# Patient Record
Sex: Female | Born: 1985 | Hispanic: Yes | Marital: Single | Smoking: Never smoker
Health system: Southern US, Community
[De-identification: ages and names within clinical notes are randomized; demographics above are authoritative.]

## PROBLEM LIST (undated history)

## (undated) ENCOUNTER — Inpatient Hospital Stay (HOSPITAL_COMMUNITY): Payer: Self-pay

## (undated) DIAGNOSIS — Z789 Other specified health status: Secondary | ICD-10-CM

## (undated) DIAGNOSIS — Z8659 Personal history of other mental and behavioral disorders: Secondary | ICD-10-CM

## (undated) DIAGNOSIS — B009 Herpesviral infection, unspecified: Secondary | ICD-10-CM

## (undated) DIAGNOSIS — K219 Gastro-esophageal reflux disease without esophagitis: Secondary | ICD-10-CM

## (undated) HISTORY — DX: Gastro-esophageal reflux disease without esophagitis: K21.9

## (undated) HISTORY — DX: Personal history of other mental and behavioral disorders: Z86.59

## (undated) HISTORY — PX: OTHER SURGICAL HISTORY: SHX169

---

## 2009-10-24 ENCOUNTER — Encounter: Payer: Self-pay | Admitting: Gynecology

## 2009-10-24 ENCOUNTER — Ambulatory Visit: Payer: Self-pay | Admitting: Gynecology

## 2009-10-24 ENCOUNTER — Other Ambulatory Visit: Admission: RE | Admit: 2009-10-24 | Discharge: 2009-10-24 | Payer: Self-pay | Admitting: Gynecology

## 2009-11-10 ENCOUNTER — Ambulatory Visit: Payer: Self-pay | Admitting: Gynecology

## 2009-11-21 ENCOUNTER — Ambulatory Visit: Payer: Self-pay | Admitting: Gynecology

## 2010-01-05 ENCOUNTER — Ambulatory Visit: Payer: Self-pay | Admitting: Gynecology

## 2010-04-02 ENCOUNTER — Other Ambulatory Visit: Admission: RE | Admit: 2010-04-02 | Discharge: 2010-04-02 | Payer: Self-pay | Admitting: Gynecology

## 2010-04-02 ENCOUNTER — Ambulatory Visit: Payer: Self-pay | Admitting: Gynecology

## 2010-09-20 ENCOUNTER — Ambulatory Visit: Payer: Self-pay | Admitting: Gynecology

## 2011-11-04 ENCOUNTER — Other Ambulatory Visit (HOSPITAL_COMMUNITY): Payer: Self-pay | Admitting: Physician Assistant

## 2011-11-04 DIAGNOSIS — Z3689 Encounter for other specified antenatal screening: Secondary | ICD-10-CM

## 2011-11-04 LAB — OB RESULTS CONSOLE ABO/RH: RH Type: POSITIVE

## 2011-11-04 LAB — OB RESULTS CONSOLE HEPATITIS B SURFACE ANTIGEN: Hepatitis B Surface Ag: NEGATIVE

## 2011-11-04 LAB — OB RESULTS CONSOLE HIV ANTIBODY (ROUTINE TESTING): HIV: NONREACTIVE

## 2011-11-04 LAB — OB RESULTS CONSOLE GC/CHLAMYDIA: Gonorrhea: NEGATIVE

## 2011-11-04 LAB — OB RESULTS CONSOLE RUBELLA ANTIBODY, IGM: Rubella: IMMUNE

## 2011-11-08 ENCOUNTER — Ambulatory Visit (HOSPITAL_COMMUNITY)
Admission: RE | Admit: 2011-11-08 | Discharge: 2011-11-08 | Disposition: A | Discharge status: Home / Self Care | Payer: Medicaid Other | Source: Ambulatory Visit | Attending: Physician Assistant | Admitting: Physician Assistant

## 2011-11-08 ENCOUNTER — Other Ambulatory Visit (HOSPITAL_COMMUNITY): Payer: Self-pay | Admitting: Physician Assistant

## 2011-11-08 DIAGNOSIS — Z3689 Encounter for other specified antenatal screening: Secondary | ICD-10-CM

## 2011-12-30 ENCOUNTER — Other Ambulatory Visit (HOSPITAL_COMMUNITY): Payer: Self-pay | Admitting: Nurse Practitioner

## 2011-12-30 DIAGNOSIS — Z3689 Encounter for other specified antenatal screening: Secondary | ICD-10-CM

## 2011-12-31 NOTE — L&D Delivery Note (Signed)
Delivery Note At  a viable unspecified sex was delivered via  (Presentation:loa ;  ).  APGAR:9 ,9 ; weight .   Placenta status:spont , .  Cord:3vc with the following complications:none .    Anesthesia: epidural  Episiotomy: none Lacerations: none Suture Repair:none Est. Blood Loss350 (mL):   Mom to AICU.  Baby to nursery-stable.  Colden Samaras [name] 04/19/2012, 3:30 PM

## 2012-01-02 ENCOUNTER — Ambulatory Visit (HOSPITAL_COMMUNITY)
Admission: RE | Admit: 2012-01-02 | Discharge: 2012-01-02 | Disposition: A | Discharge status: Home / Self Care | Payer: Medicaid Other | Source: Ambulatory Visit | Attending: Nurse Practitioner | Admitting: Nurse Practitioner

## 2012-01-02 DIAGNOSIS — Z363 Encounter for antenatal screening for malformations: Secondary | ICD-10-CM | POA: Insufficient documentation

## 2012-01-02 DIAGNOSIS — O358XX0 Maternal care for other (suspected) fetal abnormality and damage, not applicable or unspecified: Secondary | ICD-10-CM | POA: Insufficient documentation

## 2012-01-02 DIAGNOSIS — Z3689 Encounter for other specified antenatal screening: Secondary | ICD-10-CM

## 2012-01-02 DIAGNOSIS — Z1389 Encounter for screening for other disorder: Secondary | ICD-10-CM | POA: Insufficient documentation

## 2012-02-25 ENCOUNTER — Encounter (HOSPITAL_COMMUNITY): Payer: Self-pay | Admitting: *Deleted

## 2012-02-25 ENCOUNTER — Inpatient Hospital Stay (HOSPITAL_COMMUNITY)
Admission: AD | Admit: 2012-02-25 | Discharge: 2012-02-25 | Disposition: A | Discharge status: Home / Self Care | Payer: Medicaid Other | Source: Ambulatory Visit | Attending: Family Medicine | Admitting: Family Medicine

## 2012-02-25 DIAGNOSIS — O99891 Other specified diseases and conditions complicating pregnancy: Secondary | ICD-10-CM | POA: Insufficient documentation

## 2012-02-25 DIAGNOSIS — J111 Influenza due to unidentified influenza virus with other respiratory manifestations: Secondary | ICD-10-CM | POA: Insufficient documentation

## 2012-02-25 HISTORY — DX: Other specified health status: Z78.9

## 2012-02-25 MED ORDER — OSELTAMIVIR PHOSPHATE 75 MG PO CAPS
75.0000 mg | ORAL_CAPSULE | Freq: Once | ORAL | Status: AC
  Administered 2012-02-25: 75 mg via ORAL
  Filled 2012-02-25: qty 1

## 2012-02-25 MED ORDER — OSELTAMIVIR PHOSPHATE 75 MG PO CAPS: 75.0000 mg | ORAL_CAPSULE | Freq: Two times a day (BID) | ORAL | Status: AC

## 2012-02-25 NOTE — Progress Notes (Signed)
Notified of pt presenting for sore throat and heartburn. Notified of fever.  Will be down to see pt.

## 2012-02-25 NOTE — Discharge Instructions (Signed)
Informacin acerca de la gripe (Influenza Facts) La gripe es una enfermedad respiratoria contagiosa causada por el virus de la influenza. Puede causar Neomia Dear enfermedad moderada a severa. Mientras que la mayora de las personas sanas se recupera de la gripe sin un tratamiento especfico y sin complicaciones, los ancianos, los nios pequeos y los que sufren ciertas enfermedades tienen ms riesgo de sufrir complicaciones graves, y an Musician. CAUSAS  El virus de la gripe se [place] de persona a persona por gotitas que se eliminan al toser o al estornudar.   Una persona tambin puede infectarse al tocar un objeto o superficie contaminada con virus y [facility] mano a la boca, los ojos o la Clinical cytogeneticist.   Los adultos pueden infectar a Sports administrator anterior a que se produzcan los sntomas y [place] 7 das despus de enfermarse. Por lo Blaine Hamper, es posible que una persona contagie an antes de saber que est enfermo y que siga contagiando a otras personas mientras est enfermo.  SNTOMAS  Fiebre (normalmente alta).   Dolor de [place].   Cansancio (puede ser extremo).   Tos.   Dolor de Advertising copywriter.   Congestin nasal o que gotea.   Dolores [employer] cuerpo.   Puede presentar diarrea o vmitos, especialmente los nios.   Estos sntomas se conocen como "sntomas caractersticos de la gripe". Muchas enfermedades distintas, incluso el resfro comn, pueden tener sntomas similares.  DIAGNSTICO  Existen pruebas disponibles que pueden determinar si usted tiene gripe, siempre que se realicen dentro de los primeros 2  3 das despus del comienzo de los sntomas.   Un examen mdico y anlisis adicionales pueden ser necesarios para identificar si tiene una enfermedad que est complicando la gripe.  RIESGOS Y COMPLICACIONES Algunos de las complicaciones que ocasiona la gripe son:  Josefina Do bacteriana o neumona progresiva causada por el virus de la gripe.   Prdida de lquidos corporales  (deshidratacin).   Empeoramiento de enfermedades crnicas, tales como la insuficiencia cardaca, el asma, o la diabetes.   Problemas en la cavidad nasal e infecciones del odo.  INSTRUCCIONES PARA EL CUIDADO DOMICILIARIO  Solicite atencin mdica tempranamente.   Si est en riesgo de sufrir complicaciones de la gripe, Hospital doctor a un profesional de la salud cuando [employer] sntomas. Las [employer] tienen un alto riesgo de complicaciones son:   Mayores de 50 aos de edad.   Pacientes con enfermedades crnicas.   Mujeres embarazadas.   Nios pequeos.   El profesional que lo asiste podr recomendar la utilizacin de ciertas drogas antivirales para el tratamiento de la gripe.   Si contrae gripe, es aconsejable que descanse lo suficiente, beba gran cantidad de lquidos y evite el consumo de alcohol y tabaco.   Tambin puede tomar medicamentos de venta libre que alivien los sntomas de la gripe, si se lo permite el profesional que lo asiste. (Nunca de aspirina a nios o adolescentes que tienen sntomas de la gripe, particularmente fiebre).  PREVENCIN La mejor forma y la ms simple de prevenir la gripe es vacunndose cada otoo. Otras medidas que pueden ayudarlo a protegerse contra la gripe son:  Medicamentos antivirales   Se han aprobado algunos medicamentos antivirales para la prevencin de esta enfermedad Son medicamentos prescriptos y Hospital doctor al mdico antes de utilizarlos.   Hbitos para [employer] boca y la Darene Lamer con un tis cuando tosa o estornude, luego bote el tis luego de usarlo.   Lvese las  manos frecuentemente con agua y jabn, especialmente luego de toser o Engineering geologist. Si no tiene IT trainer, use un limpiador de manos con alcohol.   Evite el contacto personas que estn enfermas.   Si contrae la gripe, no vaya al Aleen Campi o escuela. Si est enfermo, no se acerque a otras personas para no contagiarlas.   Evite tocarse los ojos,  la nariz o la boca. Los grmenes a menudo se contagian de Building services engineer.  SIGNOS DE EMERGENCIA QUE NECESITAN ATENCIN MDICA URGENTE EN NIOS:  Respiracin rpida o problemas para respirar.   Color de piel azulado.   No bebe suficiente cantidad de lquidos.   No se despierta o no interacta.   Est tan irritable que no quiere que se lo cargue.   Los sntomas mejoran pero luego vuelven con fiebre y la tos [place].   Fiebre con erupcin cutnea.  SIGNOS DE EMERGENCIA QUE NECESITAN ATENCIN MDICA URGENTE EN ADULTOS:  Le falta la respiracin o presenta dificultades respiratorias.   Dolor o presin en el pecho o abdomen.   Mareos repentinos.   Confusin.   Vmitos intensos y persistentes.  SOLICITE ATENCIN MDICA DE INMEDIATO SI OBSERVA: Usted o alguien que conoce tiene los sntomas descritos arriba. Cuando llegue al centro de emergencias, comunique en la recepcin que cree tener gripe. Es posible que le pidan que utilice una mscara y/o [place] en un rea aislada para evitar que otros se contagien. EST SEGURO QUE:  Comprende las instrucciones para el alta mdica.   Controlar su enfermedad.   Solicitar atencin mdica de inmediato segn las indicaciones.  Document Released: 03/24/2008 Document Revised: 08/28/2011 [facility] Patient Information 2012 [place], [place].

## 2012-02-25 NOTE — Progress Notes (Signed)
K. Shaw, CNM at bedside.  Assessment done and poc discussed with pt.  

## 2012-02-25 NOTE — Progress Notes (Signed)
Pt states for the last 2 days she has body aches, a bad sore throat and has vomited 4 times

## 2012-02-25 NOTE — Progress Notes (Signed)
Interpreter at bedside for discharge instructions . 

## 2012-02-25 NOTE — ED Provider Notes (Signed)
Kristen Frank is a 26 y.o. female presenting for eval of sore throat fever x 24 hrs. Also with body aches, congestion and dry cough. Denies preg complications such as bldg or leak or dysuria. Has not had any family members with illness. Also c/o heartburn x 2 wks- no N/V. History OB History    Grav Para Term Preterm Abortions TAB SAB Ect Mult Living   1              Past Medical History  Diagnosis Date  . No pertinent past medical history    History reviewed. No pertinent past surgical history. Family History: family history is not on file. Social History:  reports that she has never smoked. She does not have any smokeless tobacco history on file. She reports that she does not drink alcohol or use illicit drugs.  ROS    Blood pressure 119/84, pulse 96, temperature 101.3 F (38.5 C), temperature source Oral, resp. rate 20, height 5\' 1"  (1.549 m), weight 59.875 kg (132 lb), SpO2 99.00%. Maternal Exam:  Uterine Assessment: Rare, mild ctx  Cervix: Exam deferred  Fetal Exam Fetal Monitor Review: Baseline rate: 160.  Variability: moderate (6-25 bpm).   Pattern: accelerations present and no decelerations.       Physical Exam  Constitutional: She is oriented to person, place, and time. She appears well-developed and well-nourished.  HENT:  Head: Normocephalic.  Mouth/Throat: No oropharyngeal exudate.       Neg for swollen glands  Neck: Normal range of motion.  Cardiovascular: Normal rate.   Respiratory: Effort normal.  Musculoskeletal: Normal range of motion.  Neurological: She is alert and oriented to person, place, and time.  Skin: Skin is warm and dry.  Psychiatric: She has a normal mood and affect. Her behavior is normal. Thought content normal.    Prenatal labs: ABO, Rh:   Antibody:   Rubella:   RPR:    HBsAg:    HIV:    GBS:     Assessment/Plan: IUP at 30.6 Presumptive flu  Begin tx for Tamiflu since s/s began approx 24 hrs ago- first dose here Rx Tamiflu  75mg  BID x 5d Tylenol 650mg  q 4-6 hours for fever/aches Rev'd OTC meds for reflux Keep next scheduled appt   Kristen Frank 02/25/2012, 10:39 PM

## 2012-02-26 LAB — RAPID STREP SCREEN (MED CTR MEBANE ONLY): Streptococcus, Group A Screen (Direct): NEGATIVE

## 2012-02-26 NOTE — ED Provider Notes (Signed)
Chart reviewed and agree with management and plan.  

## 2012-04-18 ENCOUNTER — Inpatient Hospital Stay (HOSPITAL_COMMUNITY)
Admission: AD | Admit: 2012-04-18 | Discharge: 2012-04-21 | DRG: 774 | Disposition: A | Discharge status: Home / Self Care | Payer: Medicaid Other | Source: Ambulatory Visit | Attending: Obstetrics & Gynecology | Admitting: Obstetrics & Gynecology

## 2012-04-18 ENCOUNTER — Encounter (HOSPITAL_COMMUNITY): Payer: Self-pay

## 2012-04-18 DIAGNOSIS — O4290 Premature rupture of membranes, unspecified as to length of time between rupture and onset of labor, unspecified weeks of gestation: Principal | ICD-10-CM | POA: Diagnosis present

## 2012-04-18 DIAGNOSIS — IMO0002 Reserved for concepts with insufficient information to code with codable children: Secondary | ICD-10-CM | POA: Diagnosis present

## 2012-04-18 DIAGNOSIS — O1490 Unspecified pre-eclampsia, unspecified trimester: Secondary | ICD-10-CM

## 2012-04-18 HISTORY — DX: Herpesviral infection, unspecified: B00.9

## 2012-04-18 LAB — COMPREHENSIVE METABOLIC PANEL
ALT: 25 U/L (ref 0–35)
AST: 42 U/L — ABNORMAL HIGH (ref 0–37)
Albumin: 3 g/dL — ABNORMAL LOW (ref 3.5–5.2)
Alkaline Phosphatase: 223 U/L — ABNORMAL HIGH (ref 39–117)
CO2: 20 mEq/L (ref 19–32)
Chloride: 101 mEq/L (ref 96–112)
Potassium: 4.2 mEq/L (ref 3.5–5.1)
Sodium: 135 mEq/L (ref 135–145)
Total Bilirubin: 0.2 mg/dL — ABNORMAL LOW (ref 0.3–1.2)

## 2012-04-18 LAB — CBC
Hemoglobin: 14.4 g/dL (ref 12.0–15.0)
Platelets: 174 10*3/uL (ref 150–400)
RBC: 4.62 MIL/uL (ref 3.87–5.11)
WBC: 7.8 10*3/uL (ref 4.0–10.5)

## 2012-04-18 MED ORDER — NALBUPHINE SYRINGE 5 MG/0.5 ML
10.0000 mg | INJECTION | INTRAMUSCULAR | Status: DC | PRN
  Administered 2012-04-19: 10 mg via INTRAVENOUS
  Filled 2012-04-18 (×2): qty 0.5

## 2012-04-18 MED ORDER — IBUPROFEN 600 MG PO TABS: 600.0000 mg | ORAL_TABLET | Freq: Four times a day (QID) | ORAL | Status: DC | PRN

## 2012-04-18 MED ORDER — LACTATED RINGERS IV SOLN
INTRAVENOUS | Status: DC
  Administered 2012-04-18: 21:00:00 via INTRAVENOUS
  Administered 2012-04-19: 999 mL/h via INTRAVENOUS
  Administered 2012-04-19: 08:00:00 via INTRAVENOUS

## 2012-04-18 MED ORDER — MAGNESIUM SULFATE BOLUS VIA INFUSION
4.0000 g | Freq: Once | INTRAVENOUS | Status: DC
  Filled 2012-04-18: qty 500

## 2012-04-18 MED ORDER — OXYTOCIN 20 UNITS IN LACTATED RINGERS INFUSION - SIMPLE: 125.0000 mL/h | Freq: Once | INTRAVENOUS | Status: DC

## 2012-04-18 MED ORDER — ONDANSETRON HCL 4 MG/2ML IJ SOLN
4.0000 mg | Freq: Four times a day (QID) | INTRAMUSCULAR | Status: DC | PRN
  Administered 2012-04-19: 4 mg via INTRAVENOUS
  Filled 2012-04-18: qty 2

## 2012-04-18 MED ORDER — OXYCODONE-ACETAMINOPHEN 5-325 MG PO TABS: 1.0000 | ORAL_TABLET | ORAL | Status: DC | PRN

## 2012-04-18 MED ORDER — CITRIC ACID-SODIUM CITRATE 334-500 MG/5ML PO SOLN: 30.0000 mL | ORAL | Status: DC | PRN

## 2012-04-18 MED ORDER — VALACYCLOVIR HCL 500 MG PO TABS
500.0000 mg | ORAL_TABLET | Freq: Two times a day (BID) | ORAL | Status: DC
  Administered 2012-04-18 – 2012-04-19 (×2): 500 mg via ORAL
  Filled 2012-04-18 (×4): qty 1

## 2012-04-18 MED ORDER — OXYTOCIN BOLUS FROM INFUSION
500.0000 mL | Freq: Once | INTRAVENOUS | Status: DC
  Filled 2012-04-18: qty 1000
  Filled 2012-04-18: qty 500

## 2012-04-18 MED ORDER — MAGNESIUM SULFATE 40 G IN LACTATED RINGERS - SIMPLE
2.0000 g/h | INTRAVENOUS | Status: AC
  Administered 2012-04-18: 4 g/h via INTRAVENOUS
  Administered 2012-04-19: 2 g/h via INTRAVENOUS
  Filled 2012-04-18 (×2): qty 500

## 2012-04-18 MED ORDER — LACTATED RINGERS IV SOLN: 500.0000 mL | INTRAVENOUS | Status: DC | PRN

## 2012-04-18 MED ORDER — LIDOCAINE HCL (PF) 1 % IJ SOLN
30.0000 mL | INTRAMUSCULAR | Status: DC | PRN
  Filled 2012-04-18: qty 30

## 2012-04-18 MED ORDER — FLEET ENEMA 7-19 GM/118ML RE ENEM: 1.0000 | ENEMA | RECTAL | Status: DC | PRN

## 2012-04-18 MED ORDER — ACETAMINOPHEN 325 MG PO TABS: 650.0000 mg | ORAL_TABLET | ORAL | Status: DC | PRN

## 2012-04-18 MED ORDER — MISOPROSTOL 25 MCG QUARTER TABLET
25.0000 ug | ORAL_TABLET | Freq: Once | ORAL | Status: AC
  Administered 2012-04-18: 25 ug via VAGINAL
  Filled 2012-04-18: qty 0.25

## 2012-04-18 NOTE — Progress Notes (Signed)
   Subjective: Notified by RN of pt report of epigastric pain.  Pt denies vision changes or headache.  Remains comfortable with contractions.  Objective: BP 133/101  Pulse 66  Temp(Src) 98.2 F (36.8 C) (Oral)  Resp 20  Ht 5' 0.75" (1.543 m)  Wt 62.256 kg (137 lb 4 oz)  BMI 26.15 kg/m2      FHT:  FHR: 140's bpm, variability: moderate,  accelerations:  Present,  decelerations:  Absent UC:   irregular, every 5-10 minutes SVE:   Dilation: Closed Effacement (%): Thick Station: -3 Exam by:: orton, jonathan  Labs: Lab Results  Component Value Date   WBC 7.8 04/18/2012   HGB 14.4 04/18/2012   HCT 43.0 04/18/2012   MCV 93.1 04/18/2012   PLT 174 04/18/2012   CMP     Component Value Date/Time   NA 135 04/18/2012 2045   K 4.2 04/18/2012 2045   CL 101 04/18/2012 2045   CO2 20 04/18/2012 2045   GLUCOSE 85 04/18/2012 2045   BUN 17 04/18/2012 2045   CREATININE 0.74 04/18/2012 2045   CALCIUM 9.5 04/18/2012 2045   PROT 6.8 04/18/2012 2045   ALBUMIN 3.0* 04/18/2012 2045   AST 42* 04/18/2012 2045   ALT 25 04/18/2012 2045   ALKPHOS 223* 04/18/2012 2045   BILITOT 0.2* 04/18/2012 2045   GFRNONAA >90 04/18/2012 2045   GFRAA >90 04/18/2012 2045     Assessment / Plan: PROM Augmentation of Labor Preeclampsia  Labor: Continue cytotec Preeclampsia:  Begin Magnesium Sulfate Fetal Wellbeing:  Category I Pain Control:  Labor support without medications I/D:  n/a Anticipated MOD:  NSVD  [facility] 04/18/2012, 11:12 PM

## 2012-04-18 NOTE — H&P (Signed)
Kristen Frank is a 26 y.o. female presenting for SROM @ 10:30 am. Positive [place].  Maternal Medical History:  Reason for admission: Reason for admission: rupture of membranes.  Contractions: Frequency: rare.    Fetal activity: Perceived fetal activity is normal.   Last perceived fetal movement was within the past hour.    Prenatal complications: No bleeding, HIV, hypertension, preterm labor or substance abuse.   HSV   Prenatal Complications - Diabetes: none.    OB History    Grav Para Term Preterm Abortions TAB SAB Ect Mult Living   1              Past Medical History  Diagnosis Date  . No pertinent past medical history   . HSV-2 infection    History reviewed. No pertinent past surgical history. Family History: family history is not on file. Social History:  reports that she has never smoked. She does not have any smokeless tobacco history on file. She reports that she does not drink alcohol or use illicit drugs.  ROS Pertinent items are noted in HPI.  Dilation: Closed Effacement (%): Thick Exam by:: dr Rivka Safer Temperature 98.3 F (36.8 C), temperature source Oral, resp. rate 18, height 5' 0.75" (1.543 m), weight 62.256 kg (137 lb 4 oz). Exam Physical Exam  Lungs:  Normal respiratory effort, chest expands symmetrically. Lungs are clear to auscultation, no crackles or wheezes. Heart - Regular rate and rhythm.  No murmurs, gallops or rubs.    Abdomen: soft and non-tender without masses, organomegaly or hernias noted.  No guarding or rebound. Gravid.  Extremities:   Non-tender, No cyanosis, edema, or deformity noted. Skin:  Intact without suspicious lesions or rashes Cervix: normal appearing, closed. (speculum) Vagina: no evidence of active herpes. Normal appearing. No blood or discharge.  Eye - Pupils Equal Round Reactive to light Neuro: grossly intact.   Prenatal labs: pending  Assessment/Plan: 26 y/o G1P0 @ 38.3 with SROM and reassuring fetal monitoring  without contractions.   Admit for induction of labor cytotec 25 mcg vaginal Will recheck in 4 hours, if advancing will start low dose pitocin. No evidence of infection at this time.  Edd Arbour MD 04/18/2012, 8:47 PM

## 2012-04-18 NOTE — H&P (Signed)
Pt assessed and agree with plan of care of resident.

## 2012-04-18 NOTE — MAU Note (Signed)
Patient is here with c/o leaking amniotic fluids since 10am, she states that the fluids is clear. She denies any pain. Reports good fetal movement.

## 2012-04-18 NOTE — Progress Notes (Signed)
Unable to place cytotec due to more than 3 uc's in 10 min.  Called provider to inform them, state they will be down to place.

## 2012-04-19 ENCOUNTER — Inpatient Hospital Stay (HOSPITAL_COMMUNITY): Payer: Medicaid Other | Admitting: Anesthesiology

## 2012-04-19 ENCOUNTER — Encounter (HOSPITAL_COMMUNITY): Payer: Self-pay | Admitting: Anesthesiology

## 2012-04-19 ENCOUNTER — Encounter (HOSPITAL_COMMUNITY): Payer: Self-pay | Admitting: Obstetrics and Gynecology

## 2012-04-19 DIAGNOSIS — IMO0002 Reserved for concepts with insufficient information to code with codable children: Secondary | ICD-10-CM

## 2012-04-19 DIAGNOSIS — O4290 Premature rupture of membranes, unspecified as to length of time between rupture and onset of labor, unspecified weeks of gestation: Secondary | ICD-10-CM

## 2012-04-19 LAB — CBC
HCT: 43.6 % (ref 36.0–46.0)
Hemoglobin: 14.8 g/dL (ref 12.0–15.0)
MCHC: 33.9 g/dL (ref 30.0–36.0)
MCV: 92 fL (ref 78.0–100.0)
RDW: 14.4 % (ref 11.5–15.5)
WBC: 16.1 10*3/uL — ABNORMAL HIGH (ref 4.0–10.5)

## 2012-04-19 LAB — RPR: RPR Ser Ql: NONREACTIVE

## 2012-04-19 LAB — MRSA PCR SCREENING: MRSA by PCR: NEGATIVE

## 2012-04-19 LAB — ABO/RH: ABO/RH(D): O POS

## 2012-04-19 MED ORDER — WITCH HAZEL-GLYCERIN EX PADS: 1.0000 "application " | MEDICATED_PAD | CUTANEOUS | Status: DC | PRN

## 2012-04-19 MED ORDER — NALBUPHINE SYRINGE 5 MG/0.5 ML
10.0000 mg | INJECTION | Freq: Once | INTRAMUSCULAR | Status: AC
  Administered 2012-04-19: 10 mg via INTRAMUSCULAR
  Filled 2012-04-19: qty 1

## 2012-04-19 MED ORDER — DIPHENHYDRAMINE HCL 50 MG/ML IJ SOLN: 12.5000 mg | INTRAMUSCULAR | Status: DC | PRN

## 2012-04-19 MED ORDER — LANOLIN HYDROUS EX OINT: TOPICAL_OINTMENT | CUTANEOUS | Status: DC | PRN

## 2012-04-19 MED ORDER — BENZOCAINE-MENTHOL 20-0.5 % EX AERO
1.0000 "application " | INHALATION_SPRAY | CUTANEOUS | Status: DC | PRN
  Filled 2012-04-19: qty 56

## 2012-04-19 MED ORDER — EPHEDRINE 5 MG/ML INJ: 10.0000 mg | INTRAVENOUS | Status: DC | PRN

## 2012-04-19 MED ORDER — DIPHENHYDRAMINE HCL 25 MG PO CAPS: 25.0000 mg | ORAL_CAPSULE | Freq: Four times a day (QID) | ORAL | Status: DC | PRN

## 2012-04-19 MED ORDER — PHENYLEPHRINE 40 MCG/ML (10ML) SYRINGE FOR IV PUSH (FOR BLOOD PRESSURE SUPPORT)
80.0000 ug | PREFILLED_SYRINGE | INTRAVENOUS | Status: DC | PRN
  Filled 2012-04-19: qty 5

## 2012-04-19 MED ORDER — DIBUCAINE 1 % RE OINT
1.0000 "application " | TOPICAL_OINTMENT | RECTAL | Status: DC | PRN
  Filled 2012-04-19: qty 28

## 2012-04-19 MED ORDER — LACTATED RINGERS IV SOLN: 500.0000 mL | Freq: Once | INTRAVENOUS | Status: DC

## 2012-04-19 MED ORDER — PHENYLEPHRINE 40 MCG/ML (10ML) SYRINGE FOR IV PUSH (FOR BLOOD PRESSURE SUPPORT): 80.0000 ug | PREFILLED_SYRINGE | INTRAVENOUS | Status: DC | PRN

## 2012-04-19 MED ORDER — OXYTOCIN 20 UNITS IN LACTATED RINGERS INFUSION - SIMPLE
1.0000 m[IU]/min | INTRAVENOUS | Status: DC
  Administered 2012-04-19: 2 m[IU]/min via INTRAVENOUS

## 2012-04-19 MED ORDER — SIMETHICONE 80 MG PO CHEW: 80.0000 mg | CHEWABLE_TABLET | ORAL | Status: DC | PRN

## 2012-04-19 MED ORDER — EPHEDRINE 5 MG/ML INJ
10.0000 mg | INTRAVENOUS | Status: DC | PRN
  Filled 2012-04-19: qty 4

## 2012-04-19 MED ORDER — OXYCODONE-ACETAMINOPHEN 5-325 MG PO TABS: 1.0000 | ORAL_TABLET | ORAL | Status: DC | PRN

## 2012-04-19 MED ORDER — FENTANYL 2.5 MCG/ML BUPIVACAINE 1/10 % EPIDURAL INFUSION (WH - ANES)
14.0000 mL/h | INTRAMUSCULAR | Status: DC
  Filled 2012-04-19: qty 60

## 2012-04-19 MED ORDER — LIDOCAINE HCL (PF) 1 % IJ SOLN
INTRAMUSCULAR | Status: DC | PRN
  Administered 2012-04-19: 3 mL
  Administered 2012-04-19: 4 mL

## 2012-04-19 MED ORDER — ONDANSETRON HCL 4 MG/2ML IJ SOLN: 4.0000 mg | INTRAMUSCULAR | Status: DC | PRN

## 2012-04-19 MED ORDER — LACTATED RINGERS IV SOLN
INTRAVENOUS | Status: DC
  Administered 2012-04-19 – 2012-04-20 (×2): via INTRAVENOUS

## 2012-04-19 MED ORDER — PRENATAL MULTIVITAMIN CH
1.0000 | ORAL_TABLET | Freq: Every day | ORAL | Status: DC
  Administered 2012-04-20 – 2012-04-21 (×2): 1 via ORAL
  Filled 2012-04-19 (×2): qty 1

## 2012-04-19 MED ORDER — TERBUTALINE SULFATE 1 MG/ML IJ SOLN: 0.2500 mg | Freq: Once | INTRAMUSCULAR | Status: DC | PRN

## 2012-04-19 MED ORDER — TETANUS-DIPHTH-ACELL PERTUSSIS 5-2.5-18.5 LF-MCG/0.5 IM SUSP
0.5000 mL | Freq: Once | INTRAMUSCULAR | Status: AC
  Administered 2012-04-20: 0.5 mL via INTRAMUSCULAR
  Filled 2012-04-19 (×2): qty 0.5

## 2012-04-19 MED ORDER — IBUPROFEN 600 MG PO TABS
600.0000 mg | ORAL_TABLET | Freq: Four times a day (QID) | ORAL | Status: DC
  Administered 2012-04-19 – 2012-04-21 (×7): 600 mg via ORAL
  Filled 2012-04-19 (×7): qty 1

## 2012-04-19 MED ORDER — HYDROXYZINE HCL 50 MG PO TABS
50.0000 mg | ORAL_TABLET | Freq: Once | ORAL | Status: AC
  Administered 2012-04-19: 50 mg via ORAL
  Filled 2012-04-19: qty 1

## 2012-04-19 MED ORDER — NALBUPHINE SYRINGE 5 MG/0.5 ML
10.0000 mg | INJECTION | Freq: Once | INTRAMUSCULAR | Status: AC
  Administered 2012-04-19: 10 mg via INTRAVENOUS
  Filled 2012-04-19: qty 1

## 2012-04-19 MED ORDER — MISOPROSTOL 25 MCG QUARTER TABLET
25.0000 ug | ORAL_TABLET | Freq: Once | ORAL | Status: AC
  Administered 2012-04-19: 25 ug via VAGINAL
  Filled 2012-04-19: qty 0.25

## 2012-04-19 MED ORDER — MISOPROSTOL 25 MCG QUARTER TABLET
25.0000 ug | ORAL_TABLET | Freq: Once | ORAL | Status: DC
  Filled 2012-04-19: qty 0.25

## 2012-04-19 MED ORDER — HYDROXYZINE HCL 50 MG/ML IM SOLN: 50.0000 mg | Freq: Once | INTRAMUSCULAR | Status: DC

## 2012-04-19 MED ORDER — ONDANSETRON HCL 4 MG PO TABS: 4.0000 mg | ORAL_TABLET | ORAL | Status: DC | PRN

## 2012-04-19 MED ORDER — SENNOSIDES-DOCUSATE SODIUM 8.6-50 MG PO TABS
2.0000 | ORAL_TABLET | Freq: Every day | ORAL | Status: DC
  Administered 2012-04-19 – 2012-04-20 (×2): 2 via ORAL

## 2012-04-19 MED ORDER — FENTANYL 2.5 MCG/ML BUPIVACAINE 1/10 % EPIDURAL INFUSION (WH - ANES)
INTRAMUSCULAR | Status: DC | PRN
  Administered 2012-04-19: 12 mL/h via EPIDURAL

## 2012-04-19 MED ORDER — ZOLPIDEM TARTRATE 5 MG PO TABS: 5.0000 mg | ORAL_TABLET | Freq: Every evening | ORAL | Status: DC | PRN

## 2012-04-19 NOTE — Progress Notes (Signed)
Lab at bedside for CBC draw

## 2012-04-19 NOTE — Progress Notes (Signed)
SVD of viable female per Marlynn Perking CNM.

## 2012-04-19 NOTE — Anesthesia Postprocedure Evaluation (Signed)
  Anesthesia Post-op Note  Patient: Kristen Frank  Procedure(s) Performed: * Lumbar Epidural for L&D *  Patient Location: ICU  Anesthesia Type: Epidural  Level of Consciousness: awake, alert  and oriented  Airway and Oxygen Therapy: Patient Spontanous Breathing  Post-op Pain: none  Post-op Assessment: Post-op Vital signs reviewed, Patient's Cardiovascular Status Stable, Respiratory Function Stable, Patent Airway, No signs of Nausea or vomiting, Pain level controlled, No headache, No backache, No residual numbness and No residual motor weakness. Patient has some numbness of face bilaterally, probably related to Magnesium Sulfate infusion.  Post-op Vital Signs: Reviewed and stable  Complications: No apparent anesthesia complications

## 2012-04-19 NOTE — Progress Notes (Signed)
Kristen Frank is a 26 y.o. G1P0 at [redacted]w[redacted]d by ultrasound admitted for induction of labor due to Spontaneous rupture of membranes Spoke to patient in spanish, she understood and had all questions answered.  Subjective: Minimal pain.   Objective: BP 110/82  Pulse 68  Temp(Src) 98.2 F (36.8 C) (Oral)  Resp 16  Ht 5' 0.75" (1.543 m)  Wt 62.256 kg (137 lb 4 oz)  BMI 26.15 kg/m2   Total I/O In: 928.3 [P.O.:240; I.V.:688.3] Out: 2000 [Urine:2000]  FHT:  FHR: 145 bpm, variability: moderate,  accelerations:  Abscent,  decelerations:  Absent UC:   irregular SVE:    1 cm 50% 0 station Soft BISHOP 7  Labs: Lab Results  Component Value Date   WBC 7.8 04/18/2012   HGB 14.4 04/18/2012   HCT 43.0 04/18/2012   MCV 93.1 04/18/2012   PLT 174 04/18/2012   Assessment / Plan: IOL 2nd SROM progressing well with cytotec.   Labor: Progressing normally Preeclampsia:  on magnesium sulfate Fetal Wellbeing:  Category I Pain Control:  nubain, may have epidural I/D:  n/a Anticipated MOD:  NSVD  Joie Reamer MD 04/19/2012, 5:19 AM

## 2012-04-19 NOTE — Progress Notes (Signed)
Kristen Frank is a 26 y.o. G1P0 at [redacted]w[redacted]d by ultrasound admitted for induction of labor due to Spontaneous rupture of BOW.  Subjective:   Objective: BP 115/82  Pulse 98  Temp(Src) 98 F (36.7 C) (Oral)  Resp 16  Ht 5' 0.75" (1.543 m)  Wt 137 lb 4 oz (62.256 kg)  BMI 26.15 kg/m2  SpO2 94% I/O last 3 completed shifts: In: 1798.3 [P.O.:360; I.V.:1438.3] Out: 2600 [Urine:2600] Total I/O In: 0  Out: 300 [Urine:300]  FHT:  FHR: 125 bpm, variability: minimal ,  accelerations:  Abscent,  decelerations:  Absent UC:   regular, every 4 minutes SVE:   Dilation: 7 Effacement (%): 80 Station: +1;+2 Exam by:: Kristen Coe RN  Labs: Lab Results  Component Value Date   WBC 16.1* 04/19/2012   HGB 14.8 04/19/2012   HCT 43.6 04/19/2012   MCV 92.0 04/19/2012   PLT 162 04/19/2012    Assessment / Plan: progressing well  Labor: Progressing normally Preeclampsia:  on magnesium sulfate Fetal Wellbeing:  Category II Pain Control:  Epidural I/D:  n/a Anticipated MOD:  NSVD  Kristen Frank Kristen Frank 04/19/2012, 12:09 PM

## 2012-04-19 NOTE — Progress Notes (Signed)
Interpreter at bedside to assist with discussing plan of care for today.  Pt anxious and continues to ask for pain relief.   Answered questions for pt and mother regarding pain control for today and medications for PIH.  Pt and mother verbalized understanding. Will contact FP for orders for pain medication.

## 2012-04-19 NOTE — Progress Notes (Signed)
Pt "pushing" even she states she feels no pressure.  SVE completed.  Encouraged pt to not push until she feels pressure.  Pt repositioned.

## 2012-04-19 NOTE — Progress Notes (Signed)
Kristen Frank is a 26 y.o. G1P0 at [redacted]w[redacted]d by ultrasound admitted for rupture of membranes  Subjective:   Objective: BP 142/101  Pulse 94  Temp(Src) 98 F (36.7 C) (Oral)  Resp 16  Ht 5' 0.75" (1.543 m)  Wt 137 lb 4 oz (62.256 kg)  BMI 26.15 kg/m2 I/O last 3 completed shifts: In: 1798.3 [P.O.:360; I.V.:1438.3] Out: 2600 [Urine:2600]    FHT:  FHR: 135 bpm, variability: moderate,  accelerations:  Present,  decelerations:  Absent UC:   irregular, every 58 minutes SVE:   Dilation: 1 Effacement (%): 50 Station: 0 Exam by:: Edd Arbour MD  Labs: Lab Results  Component Value Date   WBC 7.8 04/18/2012   HGB 14.4 04/18/2012   HCT 43.0 04/18/2012   MCV 93.1 04/18/2012   PLT 174 04/18/2012    Assessment / Plan: Protracted latent phase  Labor: mild irreg contractions, not yet in labor Preeclampsia:  no signs or symptoms of toxicity Fetal Wellbeing:  Category I Pain Control:  nubain I/D:  n/a Anticipated MOD:  NSVD  Kristen Frank Kristen Frank 04/19/2012, 8:20 AM

## 2012-04-19 NOTE — Progress Notes (Signed)
Pt transferred to AICU via W/C for treatment of Postpartum PIH.  LR infusing at 100cc/h and Magnesium at 25cc/h (2gms/h).  Pt denies c/o headache, visual disturbances, or epigastric pain.  DTR's 2+ and no clonus noted.  VSS. Pt does however c/o facial "numbness" bilaterally and reports that it started after delivery and has remained unchanged. CNM and MDA were made aware.  Otherwise, assessment is WDL's.  Language barrier noted, so Interpreter requested to assist with admission process.

## 2012-04-19 NOTE — Progress Notes (Signed)
Holding cytotec dose at this time due to freq. uc's.

## 2012-04-19 NOTE — Anesthesia Procedure Notes (Signed)
Epidural Patient location during procedure: OB Start time: 04/19/2012 11:22 AM  Staffing Anesthesiologist: Naliyah Neth A.  Preanesthetic Checklist Completed: patient identified, site marked, surgical consent, pre-op evaluation, timeout performed, IV checked, risks and benefits discussed and monitors and equipment checked  Epidural Patient position: sitting Prep: site prepped and draped and DuraPrep Patient monitoring: continuous pulse ox and blood pressure Approach: midline Injection technique: LOR air  Needle:  Needle type: Tuohy  Needle gauge: 17 G Needle length: 9 cm Needle insertion depth: 4 cm Catheter type: closed end flexible Catheter size: 19 Gauge Catheter at skin depth: 9 cm Test dose: negative and Other  Assessment Events: blood not aspirated, injection not painful, no injection resistance, negative IV test and no paresthesia  Additional Notes Patient identified. Risks and benefits discussed including failed block, incomplete  Pain control, post dural puncture headache, nerve damage, paralysis, blood pressure Changes, nausea, vomiting, reactions to medications-both toxic and allergic and post Partum back pain. All questions were answered. Patient expressed understanding and wished to proceed. Sterile technique was used throughout procedure. Epidural site was Dressed with sterile barrier dressing. No paresthesias, signs of intravascular injection Or signs of intrathecal spread were encountered.  Patient was more comfortable after the epidural was dosed. Epidural performed with Spanish interpreter present.  Please see RN's note for documentation of vital signs and FHR which are stable.

## 2012-04-19 NOTE — Anesthesia Preprocedure Evaluation (Signed)

## 2012-04-20 DIAGNOSIS — O1490 Unspecified pre-eclampsia, unspecified trimester: Secondary | ICD-10-CM

## 2012-04-20 HISTORY — DX: Unspecified pre-eclampsia, unspecified trimester: O14.90

## 2012-04-20 NOTE — Addendum Note (Signed)
Addendum  created 04/20/12 0804 by Shanon Payor, CRNA   Modules edited:Notes Section

## 2012-04-20 NOTE — Progress Notes (Signed)
UR chart review completed.  

## 2012-04-20 NOTE — Progress Notes (Signed)
Post Partum Day 1 Subjective: voiding, tolerating PO and c/o facial tingling like her face is asleep.  Objective: Blood pressure 120/80, pulse 65, temperature 97.9 F (36.6 C), temperature source Oral, resp. rate 18, height 5' 0.75" (1.543 m), weight 133 lb 6.4 oz (60.51 kg), SpO2 99.00%, unknown if currently breastfeeding.  Physical Exam:  General: alert, cooperative, appears stated age and no distress Lochia: appropriate Uterine Fundus: firm Incision: DVT Evaluation: No evidence of DVT seen on physical exam. Negative Homan's sign. No cords or calf tenderness. No significant calf/ankle edema. DTR's 1+, no clonus   Basename 04/19/12 1030 04/18/12 2045  HGB 14.8 14.4  HCT 43.6 43.0    Assessment/Plan: Plan for discharge tomorrow and Breastfeeding   LOS: 2 days   Yonah Tangeman [name] 04/20/2012, 6:54 AM

## 2012-04-20 NOTE — Anesthesia Postprocedure Evaluation (Signed)
  Anesthesia Post-op Note  Patient: Kristen Frank  Procedure(s) Performed: * No procedures listed *  Patient Location: AICU  Anesthesia Type: Epidural  Level of Consciousness: awake, alert  and oriented  Airway and Oxygen Therapy: Patient Spontanous Breathing  Post-op Pain: none  Post-op Assessment: Post-op Vital signs reviewed, Patient's Cardiovascular Status Stable, No headache, No backache, No residual numbness and No residual motor weakness  Post-op Vital Signs: Reviewed and stable  Complications: No apparent anesthesia complications

## 2012-04-21 MED ORDER — IBUPROFEN 600 MG PO TABS: 600.0000 mg | ORAL_TABLET | Freq: Four times a day (QID) | ORAL | Status: AC

## 2012-04-21 NOTE — Discharge Instructions (Addendum)
Cuidados luego de un parto por vía vaginal °(Postpartum Care After Vaginal Delivery) °Luego del nacimiento del bebé deberá permanecer en el hospital durante 24 a 72 horas, excepto que hubiera existido algún problema, o usted sufra alguna enfermedad. Mientras se encuentre en el hospital recibirá ayuda e instrucciones por parte de las enfermeras y el médico, quienes cuidarán de usted y su bebé y le darán consejos para amamantarlo correctamente, especialmente si es el primer hijo.  °En caso de ser necesario, le prescribirán analgésicos. Observará una pequeña hemorragia vaginal y deberá cambiar los apósitos con frecuencia. Lávese las manos cuidadosamente con agua y jabón durante al menos 20 segundos luego de cambiarse el apósito o ir al baño. Si elimina coágulos o aumenta la hemorragia, infórmelo a la enfermera. No deseche los coágulos sanguíneos antes de mostrárselos a la enfermera, para asegurarse de que no es tejido placentario. °Si le han colocado una vía intravenosa, se la retirarán dentro de las 24 horas, si no hay problemas. La primera vez que se levante de la cama o tome una ducha, llame a la enfermera para que la ayude que puede sentirse débil, mareada o desmayarse. Si está amamantando, puede sentir contracciones dolorosas en el útero durante algunas semanas. Esto es normal y necesario, ya que de este modo el útero vuelve a su tamaño normal. Si no está amamantando, utilice un sostén de soporte y trate de no tocarse las mamas hasta que haya dejado de producir leche. No deben administrarse hormonas para suprimir la leche, debido a que pueden causar coágulos sanguíneos. Podrá seguir una dieta normal, excepto que sufra diabetes o presente otros problemas de salud.  °La enfermera colocará bolsas con hielo en el sitio de la episiotomía (agrandamiento quirúrgico de la apertura vaginal) para reducir el dolor y la hinchazón. En algunos casos raros hay dificultad para orinar, entonces la enfermera deberá vaciarle la  vejiga con un catéter. Si le han practicado una ligadura tubaria durante el posparto ("trompas atadas", esterilización femenina), esto no hará que permanezca más tiempo en el hospital. °Podrá tener al bebé en su habitación todo el tiempo que lo desee si el bebé no tiene ningún problema. Lleve y traiga al bebé de la nursery dentro de la cunita. No lo lleve en brazos. No abandone el área de posparto. Si la madre es Rh negativa (falta de una proteína en los glóbulos rojos) y el bebé es Rh positivo, la madre debe aplicarse la vacuna RhoGam para evitar problemas con el factor Rh en futuros embarazos °Le darán instrucciones por escrito para usted y el bebé y los medicamentos necesarios cuando reciba el alta médica. Asegúrese que comprende y sigue las indicaciones. °INSTRUCCIONES PARA EL CUIDADO DOMICILIARIO °· Siga las instrucciones y tome los medicamentos que le indicaron cuando le dieron el alta médica.  °· Utilice los medicamentos de venta libre o de prescripción para el dolor, el malestar o la fiebre, según se lo indique el profesional que lo asiste.  °· No tome aspirina, ya que puede causar hemorragias.  °· Aumente sus actividades un poco cada día para tener más fuerza y resistencia.  °· No beba alcohol, especialmente si está amamantando o toma analgésicos.  °· Tómese la temperatura dos veces por día y regístrela.  °· Podrá tener una pequeña hemorragia durante 2 a 4 semanas. Esto es normal.  °· No utilice tampones o duchas vaginales, use toallas higiénicas.  °· Trate de que alguna persona permanezca con usted y la ayude durante los primeros días en el hogar.  °·   Descanse o duerma una siesta cuando el bebé duerma.  °· Si está amamantando, use un buen sostén. Si no está amamantando, use un buen sostén y no estimule los pezones.  °· Consuma una dieta sana y siga tomando las vitaminas prenatales.  °· No conduzca vehículos, no realice actividades pesadas ni viaje hasta que su médico la autorice.  °· No mantenga relaciones  sexuales hasta que el médico lo permita.  °· Consulte con el profesional cuando puede comenzar a realizar actividad física y que tipo de ejercicios puede hacer.  °· Comuníquese inmediatamente con el médico si tiene problemas luego del parto.  °· Comuníquese con el pediatra si tiene problemas con el bebé.  °· Programe su visita de control luego del parto y cúmplala.  °SOLICITE ATENCIÓN MÉDICA SI: °· La temperatura se eleva por encima de 100° F (37.8° C).  °· Aumenta la hemorragia vaginal o elimina coágulos. Conserve algunos coágulos para mostrárselos al médico.  °· Observa sangre o siente dolor al orinar.  °· Presenta secreción vaginal con olor fétido.  °· Aumenta el dolor o la inflamación en el sitio de la episiotomía (agrandamiento quirúrgico de la apertura vaginal).  °· Sufre una cefalea grave.  °· Se siente deprimida.  °· La incisión se abre.  °· Se siente mareada o sufre un desmayo.  °· Aparece una erupción cutánea.  °· Tiene una reacción o problemas con su medicamento.  °· Siente dolor u observa enrojecimiento e hinchazón en el sitio de la vía intravenosa.  °SOLICITE ATENCIÓN MÉDICA DE INMEDIATO SI: °· Siente dolor en el pecho.  °· Comienza a sentir falta de aire.  °· Se desmaya.  °· Siente dolor, con o sin hinchazón e irritación en la pierna.  °· Tiene una hemorragia vaginal abundante, con o sin coágulos  °· Siente dolor en el estómago.  °· Observa una secreción vaginal con mal olor.  °ASEGURESE QUE:  °· Comprende estas instrucciones.  °· Controlará su enfermedad.  °· Solicitará ayuda de inmediato si no mejora o empeora.  °Document Released: 10/13/2007 Document Revised: 12/05/2011 °ExitCare® Patient Information ©2012 [employer], LLC. °

## 2012-04-21 NOTE — Discharge Summary (Signed)
Obstetric Discharge Summary Reason for Admission: rupture of membranes Prenatal Procedures: NST Intrapartum Procedures: spontaneous vaginal delivery Postpartum Procedures: none Complications-Operative and Postpartum: none Hemoglobin  Date Value Range Status  04/19/2012 14.8  12.0-15.0 (g/dL) Final     HCT  Date Value Range Status  04/19/2012 43.6  36.0-46.0 (%) Final    Physical Exam:  General: alert, cooperative, appears stated age and no distress Lochia: appropriate Uterine Fundus: firm DVT Evaluation: No evidence of DVT seen on physical exam. Negative Homan's sign. No cords or calf tenderness. No significant calf/ankle edema.  Discharge Diagnoses: Term Pregnancy-delivered  Discharge Information: Date: 04/21/2012 Activity: pelvic rest Diet: routine Medications: Ibuprofen Condition: stable Instructions: refer to practice specific booklet Discharge to: home   Newborn Data: Live born female  Birth Weight: 5 lb 8.5 oz (2510 g) APGAR: 9, 9  Home with mother.  Shara Blazing 04/21/2012, 8:01 AM  I have seen and examined this patient and I agree with the above. She will F/U with the [facility] in 4-6 wks or sooner if needed. Cam Hai 10:58 AM 04/21/2012

## 2012-05-10 ENCOUNTER — Emergency Department (HOSPITAL_COMMUNITY)
Admission: EM | Admit: 2012-05-10 | Discharge: 2012-05-10 | Disposition: A | Discharge status: Home / Self Care | Payer: Medicaid Other | Attending: Emergency Medicine | Admitting: Emergency Medicine

## 2012-05-10 ENCOUNTER — Encounter (HOSPITAL_COMMUNITY): Payer: Self-pay | Admitting: Emergency Medicine

## 2012-05-10 DIAGNOSIS — N12 Tubulo-interstitial nephritis, not specified as acute or chronic: Secondary | ICD-10-CM | POA: Insufficient documentation

## 2012-05-10 DIAGNOSIS — IMO0001 Reserved for inherently not codable concepts without codable children: Secondary | ICD-10-CM | POA: Insufficient documentation

## 2012-05-10 DIAGNOSIS — J029 Acute pharyngitis, unspecified: Secondary | ICD-10-CM | POA: Insufficient documentation

## 2012-05-10 DIAGNOSIS — R51 Headache: Secondary | ICD-10-CM | POA: Insufficient documentation

## 2012-05-10 DIAGNOSIS — R509 Fever, unspecified: Secondary | ICD-10-CM | POA: Insufficient documentation

## 2012-05-10 LAB — URINALYSIS, ROUTINE W REFLEX MICROSCOPIC
Bilirubin Urine: NEGATIVE
Ketones, ur: NEGATIVE mg/dL
Nitrite: NEGATIVE
Protein, ur: NEGATIVE mg/dL
Specific Gravity, Urine: 1.026 (ref 1.005–1.030)
Urobilinogen, UA: 0.2 mg/dL (ref 0.0–1.0)

## 2012-05-10 LAB — COMPREHENSIVE METABOLIC PANEL
ALT: 19 U/L (ref 0–35)
Albumin: 3.8 g/dL (ref 3.5–5.2)
Alkaline Phosphatase: 99 U/L (ref 39–117)
Calcium: 9.2 mg/dL (ref 8.4–10.5)
Potassium: 3.9 mEq/L (ref 3.5–5.1)
Sodium: 137 mEq/L (ref 135–145)
Total Protein: 7.4 g/dL (ref 6.0–8.3)

## 2012-05-10 LAB — CBC
MCH: 32.1 pg (ref 26.0–34.0)
MCHC: 34.6 g/dL (ref 30.0–36.0)
MCV: 92.6 fL (ref 78.0–100.0)
Platelets: 302 10*3/uL (ref 150–400)
RBC: 4.46 MIL/uL (ref 3.87–5.11)
RDW: 13.7 % (ref 11.5–15.5)

## 2012-05-10 LAB — URINE MICROSCOPIC-ADD ON

## 2012-05-10 LAB — DIFFERENTIAL
Basophils Absolute: 0 10*3/uL (ref 0.0–0.1)
Basophils Relative: 0 % (ref 0–1)
Eosinophils Absolute: 0.1 10*3/uL (ref 0.0–0.7)
Eosinophils Relative: 1 % (ref 0–5)
Neutrophils Relative %: 76 % (ref 43–77)

## 2012-05-10 LAB — RAPID STREP SCREEN (MED CTR MEBANE ONLY): Streptococcus, Group A Screen (Direct): NEGATIVE

## 2012-05-10 MED ORDER — CEFTRIAXONE SODIUM 1 G IJ SOLR
1.0000 g | Freq: Once | INTRAMUSCULAR | Status: AC
  Administered 2012-05-10: 1 g via INTRAMUSCULAR
  Filled 2012-05-10: qty 10

## 2012-05-10 MED ORDER — LIDOCAINE HCL (PF) 1 % IJ SOLN
INTRAMUSCULAR | Status: AC
  Filled 2012-05-10: qty 5

## 2012-05-10 MED ORDER — ACETAMINOPHEN 325 MG PO TABS
650.0000 mg | ORAL_TABLET | Freq: Once | ORAL | Status: AC
  Administered 2012-05-10: 650 mg via ORAL
  Filled 2012-05-10: qty 2

## 2012-05-10 MED ORDER — CIPROFLOXACIN HCL 500 MG PO TABS: 500.0000 mg | ORAL_TABLET | Freq: Two times a day (BID) | ORAL | Status: AC

## 2012-05-10 NOTE — Discharge Instructions (Signed)
You were seen and evaluated for your symptoms of fever, body aches and back pain. Your urine test showed signs for an infection concerning for kidney infection. Please take antibiotics were prescribed as instructed for the full and time. It may be best use formula for your baby until you're finished her antibiotics your infection is improved. Please discuss with your OB/GYN doctor the use of breast-feeding with your medications. Return for any worsening symptoms.   Pielonefritis - Adultos  (Pyelonephritis, Adult)  La pielonefritis es una infeccin del rin. Hay dos tipos principales de pielonefritis:   Una infeccin que se inicia rpidamente sin sntomas previos (pielonefritis [place]).   Infecciones que persisten por un largo perodo (pielonefritis crnica).  CAUSAS  Hay dos causas principales:   Las bacterias viajan desde la vejiga al rin. Este problema aparece especialmente en mujeres embarazadas. La orina en la vejiga puede infectarse por diferentes causas, por ejemplo:   Inflamacin de la prstata (prostatitis).   Durante las [employer].   Infeccin en la vejiga (cistitis).   Las bacterias viajan desde el torrente sanguneo hacia el tejido del rin.  Las enfermedades que aumentan el riesgo son:   Diabetes.   Clculos renales o en la vescula.   Cncer.   Un catter colocado en la vejiga.   Otras anormalidades del rin o de Engineer, mining.  SNTOMAS   Dolor abdominal.   Dolor en la zona del costado o flanco.   Grant Ruts.   Escalofros.   Malestar estomacal.   Sangre en la orina Larose Kells).   Necesidad frecuente de orinar   Necesidad intensa o persistente de Geographical information systems officer.   Sensacin de ardor o pinchazos al [employer].  DIAGNSTICO  El mdico diagnosticar una infeccin en su rin basndose en los sntomas. Tambin tomar [place] de [place].  TRATAMIENTO  Generalmente el tratamiento depende de la gravedad de la infeccin.   Si la infeccin  es leve y se diagnostica a tiempo, el mdico lo tratar con antibiticos por va oral y lo dejar irse a su casa.   Si la infeccin es ms grave, la bacteria podra haber ingresado al torrente sanguneo. Esto requerir antibiticos por va intravenosa y Administrator, arts en el hospital. Los sntomas pueden incluir:   Fiebre alta.   Dolor intenso en un costado del cuerpo.   Escalofros   An despus de Financial risk analyst hospital, el mdico podr indicarle antibiticos por va oral durante cierto perodo de [place].   Podr prescribirle otros tratamientos segn la causa de la infeccin.  INSTRUCCIONES PARA EL CUIDADO EN EL HOGAR   Tome los antibiticos como se le indic. Tmelos todos, aunque se sienta mejor.   Concurra para Education officer, environmental un control y asegurarse de que la infeccin ha desaparecido.   Debe ingerir gran cantidad de lquido para mantener la orina de tono claro o color amarillo plido.   Tome medicamentos para la vejiga si siente urgencia para Geographical information systems officer o lo hace con mucha frecuencia.  SOLICITE ATENCIN MDICA DE INMEDIATO SI:   Tiene fiebre.   No puede tomar los antibiticos ni ingerir lquidos.   Comienza a sentir escalofros   Siente debilidad extrema o se desmaya.   No mejora despus de 2 das de [place].  ASEGRESE DE QUE:   Comprende estas instrucciones.   Controlar su enfermedad.   Solicitar ayuda de inmediato si no mejora o si empeora.  Document Released: 09/25/2005 Document Revised: 12/05/2011 [facility] Patient Information 2012 [place], [place].   Medicamentos durante  el amamantamiento (Breastfeeding and Medicines) Algunos medicamentos se segregan en la leche maternal. La mayora de las drogas en la [place] maternal no daarn a su beb. Pero se conoce que algunos medicamentos ocasionan problemas en los bebs. Las [employer] no debern tomar estos medicamentos mientras estn [employer]. La siguiente es una lista de drogas que no deber consumir mientras est  amamantando:  Anfetaminas, cocana, marihuana, herona y otras drogas ilegales.   Drogas de quimioterapia para Management consultant.   Antibiticos con cloranfenicol.   DES (dietilestribestrol).   Drogas para la prevencin de Education officer, community.   Quinidina y [place].   Carbonato de litio y drogas para el Parkinson.   Fenilbutazona (Butazolidina).   Drogas de supresin de tiroides.   Material radioactivo que pueda haber recibido.  Medicamentos de riesgo pero que se desconoce si afectan al beb lactante:  Medicamentos para la ansiedad.   Antidepresivos.   Medicamentos antipsicticos.   Hierbas medicinales.   Medicamentos de [employer].  El consumo de cualquiera de esos medicamentos debe consultarse con el pediatra. En la [employer], [place] dao al beb puede evitarse si se elige correctamente la droga y [place] de dosis. Dar de Psychologist, clinical justo antes de tomar el medicamento ayuda a Film/video editor la cantidad a la que el beb est expuesto. Advierta al mdico que est amamantando. Esto les ayudar a Programmer, systems tratamiento para usted y su beb. Como es el caso con la mayora de las drogas, [place] poco alcohol pasar a travs de la leche materna si el consumo fue mnimo. No amamante inmediatamente despus de beber alcohol. A pesar de que las toxinas del cigarrillo llegan a la [place], la madre que no puede dejar de fumar deber Engineering geologist. Es ms saludable para el beb alimentarse de una madre fumadora que de un bibern. Sin embargo, no fume cerca de su beb. No amamante inmediatamente despus de fumar. Reduzca todo lo posible la cantidad de cigarrillos que fuma. Document Released: 12/16/2005 Document Revised: 12/05/2011 [facility] Patient Information 2012 [place], [place].    RESOURCE GUIDE  Dental Problems  Patients with Medicaid: [facility] [phone]                                           [phone] [address] Phone:  [phone]                                                  Phone:  [phone]  If unable to pay or uninsured, contact:  [facility] or [facility]. to become qualified for the adult dental clinic.  Chronic Pain Problems Contact [facility]  [phone] Patients need to be referred by their primary care doctor.  Insufficient Money for Medicine Contact United Way:  call "211" or [facility] 4244192185.  No Primary Care Doctor Call [facility]  [phone] Other agencies that provide inexpensive medical care    [facility]  [phone]    [facility]  [phone]    [facility]  [phone]  Women's Clinic  [phone]    [facility]  [phone]    [facility]  [phone]  Psychological Services [facility]  [phone] [facility]  [phone] [facility]   [phone] (emergency services [phone])  Substance Abuse Resources [facility] and Drug Services  [phone] [facility] [phone] [facility] [place] [phone] Floydene Flock 2568151238 Residential & Outpatient Substance Abuse Program  [phone]  Abuse/Neglect [facility] Child Abuse Hotline (519) 825-3941 [facility] Child Abuse Hotline 212-104-2197 (After Hours)  Emergency Shelter [facility] [phone]  Maternity Homes Room at the [place] [facility] [place] [phone] [facility] [phone]  MRSA Hotline #:   (321)110-9959    [facility] Resources  [facility] [place]     United Way                          [facility] Dept. [address] [place]                       [address]      [address]  Blondell Reveal Phone:  [phone]                                   Phone:  [phone]                  Phone:  [phone]  [facility] Mental Health Phone:  [phone]  [facility] Child Abuse Hotline [phone] 578-2449 [phone] (After Hours)

## 2012-05-10 NOTE — ED Notes (Addendum)
C/o fever and pain all over since 10am today.  Reports mild sore throat yesterday.  Pt had normal vaginal delivery at [facility] 3 weeks ago.  Pt is breastfeeding but denies complaints with breast.

## 2012-05-10 NOTE — ED Provider Notes (Signed)
History     CSN: [id number]  Arrival date & time 05/10/12  1758   First MD Initiated Contact with Patient 05/10/12 2003      Chief Complaint  Patient presents with  . Fever    HPI  History provided by the patient through a Spanish interpreter. Patient is a 26 year old Hispanic female with no significant past medical history who presents with complaints of fever, body aches, sore throat and mild headache and began early this morning. Patient does report slight sore throat yesterday as well. SHe has body aches throughout the body and primarily in the back area. Patient denies any other associated symptoms. She took one ibuprofen around 5 PM and reports improvement of body aches and back pain. She also reports improvement of fever. Patient denies nasal congestion, rhinorrhea, cough, nausea, vomiting, diarrhea, constipation, abdominal pain, dysuria, hematuria, urinary frequency, vaginal bleeding or vaginal discharge. Patient does have history of recent vaginal delivery of a healthy baby on April 21. There was no complications following delivery. Patient had normal vaginal bleeding slight discharge for several days after delivery but denies any after this. Patient has been feeling well. Patient primarily breast-feed baby but also uses formula from bottle.    Past Medical History  Diagnosis Date  . No pertinent past medical history   . HSV-2 infection     History reviewed. No pertinent past surgical history.  No family history on file.  History  Substance Use Topics  . Smoking status: Never Smoker   . Smokeless tobacco: Not on file  . Alcohol Use: No    OB History    Grav Para Term Preterm Abortions TAB SAB Ect Mult Living   1 1 1       1       Review of Systems  Constitutional: Positive for fever. Negative for chills.  HENT: Positive for sore throat. Negative for congestion, rhinorrhea, neck pain and neck stiffness.   Respiratory: Negative for cough.   Gastrointestinal:  Negative for nausea, vomiting, abdominal pain, diarrhea and constipation.  Genitourinary: Negative for dysuria, frequency, hematuria, flank pain, vaginal bleeding, vaginal discharge and vaginal pain.  Musculoskeletal: Positive for myalgias and back pain.  Skin: Negative for rash.  Neurological: Positive for headaches. Negative for dizziness and light-headedness.    Allergies  Review of patient's allergies indicates no known allergies.  Home Medications  No current outpatient prescriptions on file.  BP 108/70  Pulse 88  Temp(Src) 98.6 F (37 C) (Oral)  Resp 18  SpO2 100%  Breastfeeding? Yes  Physical Exam  Nursing note and vitals reviewed. Constitutional: She is oriented to person, place, and time. She appears well-developed and well-nourished. No distress.  HENT:  Head: Normocephalic and atraumatic.       Mild erythema pharynx.  Eyes: Conjunctivae and EOM are normal. Pupils are equal, round, and reactive to light.  Neck: Normal range of motion. Neck supple.       No meningeal signs  Cardiovascular: Normal rate and regular rhythm.   Pulmonary/Chest: Effort normal and breath sounds normal. No respiratory distress. She has no wheezes. She has no rales. She exhibits no tenderness.  Abdominal: Soft. She exhibits no distension and no mass. There is no rebound, no guarding, no CVA tenderness, no tenderness at McBurney's point and negative Murphy's sign.       Mild diffuse tenderness. Abdomen soft.  Musculoskeletal: She exhibits no edema and no tenderness.       Cervical back: Normal.  Thoracic back: Normal.       Lumbar back: Normal.       Skin appears normal the back. No swelling or erythema. No significant tenderness.  Neurological: She is alert and oriented to person, place, and time.  Skin: Skin is warm and dry. No rash noted.  Psychiatric: She has a normal mood and affect. Her behavior is normal.    ED Course  Procedures  Results for orders placed during the hospital  encounter of 05/10/12  URINALYSIS, ROUTINE W REFLEX MICROSCOPIC      Component Value Range   Color, Urine YELLOW  YELLOW    APPearance CLOUDY (*) CLEAR    Specific Gravity, Urine 1.026  1.005 - 1.030    pH 6.0  5.0 - 8.0    Glucose, UA NEGATIVE  NEGATIVE (mg/dL)   Hgb urine dipstick NEGATIVE  NEGATIVE    Bilirubin Urine NEGATIVE  NEGATIVE    Ketones, ur NEGATIVE  NEGATIVE (mg/dL)   Protein, ur NEGATIVE  NEGATIVE (mg/dL)   Urobilinogen, UA 0.2  0.0 - 1.0 (mg/dL)   Nitrite NEGATIVE  NEGATIVE    Leukocytes, UA MODERATE (*) NEGATIVE   CBC      Component Value Range   WBC 13.8 (*) 4.0 - 10.5 (K/uL)   RBC 4.46  3.87 - 5.11 (MIL/uL)   Hemoglobin 14.3  12.0 - 15.0 (g/dL)   HCT 78.2  95.6 - 21.3 (%)   MCV 92.6  78.0 - 100.0 (fL)   MCH 32.1  26.0 - 34.0 (pg)   MCHC 34.6  30.0 - 36.0 (g/dL)   RDW 08.6  57.8 - 46.9 (%)   Platelets 302  150 - 400 (K/uL)  DIFFERENTIAL      Component Value Range   Neutrophils Relative 76  43 - 77 (%)   Neutro Abs 10.5 (*) 1.7 - 7.7 (K/uL)   Lymphocytes Relative 17  12 - 46 (%)   Lymphs Abs 2.3  0.7 - 4.0 (K/uL)   Monocytes Relative 6  3 - 12 (%)   Monocytes Absolute 0.8  0.1 - 1.0 (K/uL)   Eosinophils Relative 1  0 - 5 (%)   Eosinophils Absolute 0.1  0.0 - 0.7 (K/uL)   Basophils Relative 0  0 - 1 (%)   Basophils Absolute 0.0  0.0 - 0.1 (K/uL)  COMPREHENSIVE METABOLIC PANEL      Component Value Range   Sodium 137  135 - 145 (mEq/L)   Potassium 3.9  3.5 - 5.1 (mEq/L)   Chloride 103  96 - 112 (mEq/L)   CO2 23  19 - 32 (mEq/L)   Glucose, Bld 86  70 - 99 (mg/dL)   BUN 13  6 - 23 (mg/dL)   Creatinine, Ser 6.29  0.50 - 1.10 (mg/dL)   Calcium 9.2  8.4 - 52.8 (mg/dL)   Total Protein 7.4  6.0 - 8.3 (g/dL)   Albumin 3.8  3.5 - 5.2 (g/dL)   AST 22  0 - 37 (U/L)   ALT 19  0 - 35 (U/L)   Alkaline Phosphatase 99  39 - 117 (U/L)   Total Bilirubin 0.4  0.3 - 1.2 (mg/dL)   GFR calc non Af Amer >90  >90 (mL/min)   GFR calc Af Amer >90  >90 (mL/min)  RAPID STREP  SCREEN      Component Value Range   Streptococcus, Group A Screen (Direct) NEGATIVE  NEGATIVE   URINE MICROSCOPIC-ADD ON      Component Value Range  Squamous Epithelial / LPF RARE  RARE    WBC, UA 21-50  <3 (WBC/hpf)   RBC / HPF 0-2  <3 (RBC/hpf)   Bacteria, UA FEW (*) RARE    Urine-Other MUCOUS PRESENT         1. Pyelonephritis       MDM  8:10 PM patient seen and evaluated. Patient no acute distress. She is well-appearing and nontoxic.        Angus Seller, [place] 05/11/12 [phone]

## 2012-05-12 NOTE — ED Provider Notes (Signed)
Medical screening examination/treatment/procedure(s) were performed by non-physician practitioner and as supervising physician I was immediately available for consultation/collaboration.  Lavontay Kirk, MD 05/12/12 1034 

## 2013-07-09 ENCOUNTER — Encounter (HOSPITAL_COMMUNITY): Payer: Self-pay | Admitting: *Deleted

## 2013-07-09 ENCOUNTER — Emergency Department (HOSPITAL_COMMUNITY)
Admission: EM | Admit: 2013-07-09 | Discharge: 2013-07-09 | Disposition: A | Discharge status: Home / Self Care | Payer: Self-pay | Attending: Emergency Medicine | Admitting: Emergency Medicine

## 2013-07-09 DIAGNOSIS — F419 Anxiety disorder, unspecified: Secondary | ICD-10-CM

## 2013-07-09 DIAGNOSIS — F411 Generalized anxiety disorder: Secondary | ICD-10-CM | POA: Insufficient documentation

## 2013-07-09 DIAGNOSIS — M940 Chondrocostal junction syndrome [Tietze]: Secondary | ICD-10-CM | POA: Insufficient documentation

## 2013-07-09 DIAGNOSIS — Z8619 Personal history of other infectious and parasitic diseases: Secondary | ICD-10-CM | POA: Insufficient documentation

## 2013-07-09 MED ORDER — NAPROXEN 375 MG PO TABS: 375.0000 mg | ORAL_TABLET | Freq: Two times a day (BID) | ORAL | Status: DC

## 2013-07-09 MED ORDER — LORAZEPAM 1 MG PO TABS: 1.0000 mg | ORAL_TABLET | Freq: Three times a day (TID) | ORAL | Status: DC | PRN

## 2013-07-09 NOTE — ED Notes (Signed)
Pt was lifting weights at the gym last night for about 1.5 hrs. Now c/o anterior chest pain with movement. Pt is in no distress.

## 2013-07-09 NOTE — ED Notes (Signed)
Pt in c/o chest wall pain to upper sternal area, states she was in the gym last night and thinks she pulled something, since that time she has noted increased pain with movement or taking a deep breath, no distress noted, pain reproducible with palpation

## 2013-07-09 NOTE — ED Provider Notes (Signed)
History    CSN: [id number] Arrival date & time 07/09/13  1205  First MD Initiated Contact with Patient 07/09/13 1252     Chief Complaint  Patient presents with  . Chest wall pain    (Consider location/radiation/quality/duration/timing/severity/associated sxs/prior Treatment) HPI  Kristen Frank is a 27 y.o.female without any significant PMH presents to the ER with complaints of pain around her sternum that is worsened with movement, a large breath, pushing on it. She said she was doing the elliptical machine yesterday at the gym and has not done it for a while and then this morning when she woke up she was very sore and it concerned her. She also asked for refill of her anxiety medication. She denies having any nausea, vomiting, diarrhea, confusion, shortness of breath, wheezing, coughing, sore throat, ear pain, neck pain, headaches.  Past Medical History  Diagnosis Date  . No pertinent past medical history   . HSV-2 infection    History reviewed. No pertinent past surgical history. History reviewed. No pertinent family history. History  Substance Use Topics  . Smoking status: Never Smoker   . Smokeless tobacco: Not on file  . Alcohol Use: No   OB History   Grav Para Term Preterm Abortions TAB SAB Ect Mult Living   1 1 1       1      Review of Systems  All other systems reviewed and are negative.    Allergies  Review of patient's allergies indicates no known allergies.  Home Medications   Current Outpatient Rx  Name  Route  Sig  Dispense  Refill  . LORazepam (ATIVAN) 1 MG tablet   Oral   Take 1 tablet (1 mg total) by mouth 3 (three) times daily as needed for anxiety.   15 tablet   0   . naproxen (NAPROSYN) 375 MG tablet   Oral   Take 1 tablet (375 mg total) by mouth 2 (two) times daily.   20 tablet   0    BP 98/65  Pulse 74  Temp(Src) 98 F (36.7 C) (Oral)  Resp 20  SpO2 100% Physical Exam  Nursing note and vitals reviewed. Constitutional: She  appears well-developed and well-nourished. No distress.  HENT:  Head: Normocephalic and atraumatic.  Eyes: Pupils are equal, round, and reactive to light.  Neck: Normal range of motion. Neck supple.  Cardiovascular: Normal rate and regular rhythm.   No murmur heard. Pulmonary/Chest: Effort normal. No respiratory distress. She has no wheezes. Chest wall is not dull to percussion. She exhibits tenderness and bony tenderness. She exhibits no mass, no laceration, no crepitus, no edema, no deformity, no swelling and no retraction.    Abdominal: Soft.  Neurological: She is alert.  Skin: Skin is warm and dry.  Psychiatric: Her speech is normal and behavior is normal. Her mood appears not anxious. She does not exhibit a depressed mood.    ED Course  Procedures (including critical care time) Labs Reviewed - No data to display No results found. 1. Anxiety   2. Costochondritis     MDM  Patient's symptoms are consistent with costochondritis or muscular pull. After working out yesterday and her pain being reproduced by her lifting her arms do not feel that further workup is necessary at this time. Treat her pain with Naprosyn. She also asked for refill of her Ativan which I have prescribed as well.  27 y.o.Kristen Frank's evaluation in the Emergency Department is complete. It has been  determined that no acute conditions requiring further emergency intervention are present at this time. The patient/guardian have been advised of the diagnosis and plan. We have discussed signs and symptoms that warrant return to the ED, such as changes or worsening in symptoms.  Vital signs are stable at discharge. Filed Vitals:   07/09/13 1223  BP: 98/65  Pulse: 74  Temp: 98 F (36.7 C)  Resp: 20    Patient/guardian has voiced understanding and agreed to follow-up with the PCP or specialist.   Dorthula Matas, PA-C 07/09/13 1445

## 2013-07-13 NOTE — ED Provider Notes (Signed)
Medical screening examination/treatment/procedure(s) were performed by non-physician practitioner and as supervising physician I was immediately available for consultation/collaboration.    Christopher J. Pollina, MD 07/13/13 1556 

## 2014-09-06 ENCOUNTER — Inpatient Hospital Stay (HOSPITAL_COMMUNITY)
Admission: AD | Admit: 2014-09-06 | Discharge: 2014-09-06 | Disposition: A | Discharge status: Home / Self Care | Payer: Medicaid Other | Source: Ambulatory Visit | Attending: Family Medicine | Admitting: Family Medicine

## 2014-09-06 ENCOUNTER — Encounter (HOSPITAL_COMMUNITY): Payer: Self-pay | Admitting: Pediatrics

## 2014-09-06 DIAGNOSIS — L255 Unspecified contact dermatitis due to plants, except food: Secondary | ICD-10-CM | POA: Insufficient documentation

## 2014-09-06 DIAGNOSIS — Z3201 Encounter for pregnancy test, result positive: Secondary | ICD-10-CM | POA: Insufficient documentation

## 2014-09-06 DIAGNOSIS — IMO0002 Reserved for concepts with insufficient information to code with codable children: Secondary | ICD-10-CM | POA: Insufficient documentation

## 2014-09-06 DIAGNOSIS — L237 Allergic contact dermatitis due to plants, except food: Secondary | ICD-10-CM

## 2014-09-06 HISTORY — DX: Other specified health status: Z78.9

## 2014-09-06 LAB — URINE MICROSCOPIC-ADD ON

## 2014-09-06 LAB — URINALYSIS, ROUTINE W REFLEX MICROSCOPIC
BILIRUBIN URINE: NEGATIVE
Glucose, UA: NEGATIVE mg/dL
Hgb urine dipstick: NEGATIVE
Ketones, ur: NEGATIVE mg/dL
NITRITE: NEGATIVE
PH: 7 (ref 5.0–8.0)
Protein, ur: NEGATIVE mg/dL
Urobilinogen, UA: 0.2 mg/dL (ref 0.0–1.0)

## 2014-09-06 LAB — POCT PREGNANCY, URINE: Preg Test, Ur: POSITIVE — AB

## 2014-09-06 MED ORDER — DIPHENHYDRAMINE HCL 25 MG PO CAPS
25.0000 mg | ORAL_CAPSULE | Freq: Once | ORAL | Status: AC
  Administered 2014-09-06: 25 mg via ORAL
  Filled 2014-09-06: qty 1

## 2014-09-06 NOTE — MAU Note (Signed)
Pt. States she noticed a rash on her arms on Friday. It started out small and has progressively gotten worse. She states it is now on her arms and face and it is very itchy.  Pt states her last period was June 11th. And she received a positive pregnancy test from the health department.

## 2014-09-06 NOTE — MAU Note (Addendum)
Called health dept- was told couldn't take pills, to [place] an ointment and come here. Rash on both arms and face. Started on thurs.  Started mild, has gotten worse.  Raised pustules noted, worse on rt forearm and hand and face. Itches, no pain. Pregnancy confirmed at health dept.

## 2014-09-06 NOTE — MAU Provider Note (Signed)
Attestation of Attending Supervision of Advanced Practitioner (PA/CNM/NP): Evaluation and management procedures were performed by the Advanced Practitioner under my supervision and collaboration.  I have reviewed the Advanced Practitioner's note and chart, and I agree with the management and plan.  Jacob Stinson, DO Attending Physician Faculty Practice, [facility]  

## 2014-09-06 NOTE — MAU Provider Note (Signed)
History     CSN: [id number]  Arrival date and time: 09/06/14 1534   First Provider Initiated Contact with Patient 09/06/14 2012      Chief Complaint  Patient presents with  . Rash   Rash    Kristen Frank is a 28 y.o. G1P1001 at Unknown who presents today with a rash. She states that on Friday she was working in her garden, and then she developed an itchy rash on her hands, arms and face. She states that she also had a small patch on her leg. She has not taken anything for it at this time.  She has no pregnancy complaints. She denies any abdominal pain or bleeding.   Past Medical History  Diagnosis Date  . No pertinent past medical history   . HSV-2 infection   . Medical history non-contributory     History reviewed. No pertinent past surgical history.  Family History  Problem Relation Age of Onset  . Asthma Brother     History  Substance Use Topics  . Smoking status: Never Smoker   . Smokeless tobacco: Not on file  . Alcohol Use: No    Allergies: No Known Allergies  Prescriptions prior to admission  Medication Sig Dispense Refill  . LORazepam (ATIVAN) 1 MG tablet Take 1 tablet (1 mg total) by mouth 3 (three) times daily as needed for anxiety.  15 tablet  0  . naproxen (NAPROSYN) 375 MG tablet Take 1 tablet (375 mg total) by mouth 2 (two) times daily.  20 tablet  0    Review of Systems  Skin: Positive for rash.   Physical Exam   Blood pressure 103/85, pulse 79, temperature 98.7 F (37.1 C), temperature source Oral, resp. rate 16, height 5' (1.524 m), weight 60.328 kg (133 lb), last menstrual period 06/09/2014, currently breastfeeding.  Physical Exam  Nursing note and vitals reviewed. Constitutional: She is oriented to person, place, and time. She appears well-developed and well-nourished. No distress.  Cardiovascular: Normal rate.   Respiratory: Effort normal.  GI: Soft. There is no tenderness.  Neurological: She is alert and oriented to person, place,  and time.  Skin: Skin is warm and dry.  Rash C/W poison ivy on her hands, arms, and a small patch on the right leg and tip of the nose.   Psychiatric: She has a normal mood and affect.    MAU Course  Procedures  Results for orders placed during the hospital encounter of 09/06/14 (from the past 24 hour(s))  URINALYSIS, ROUTINE W REFLEX MICROSCOPIC     Status: Abnormal   Collection Time    09/06/14  7:49 PM      Result Value Ref Range   Color, Urine YELLOW  YELLOW   APPearance CLEAR  CLEAR   Specific Gravity, Urine <1.005 (*) 1.005 - 1.030   pH 7.0  5.0 - 8.0   Glucose, UA NEGATIVE  NEGATIVE mg/dL   Hgb urine dipstick NEGATIVE  NEGATIVE   Bilirubin Urine NEGATIVE  NEGATIVE   Ketones, ur NEGATIVE  NEGATIVE mg/dL   Protein, ur NEGATIVE  NEGATIVE mg/dL   Urobilinogen, UA 0.2  0.0 - 1.0 mg/dL   Nitrite NEGATIVE  NEGATIVE   Leukocytes, UA SMALL (*) NEGATIVE  URINE MICROSCOPIC-ADD ON     Status: Abnormal   Collection Time    09/06/14  7:49 PM      Result Value Ref Range   Squamous Epithelial / LPF FEW (*) RARE   WBC, UA 3-6  <  3 WBC/hpf   RBC / HPF 0-2  <3 RBC/hpf   Bacteria, UA FEW (*) RARE   Urine-Other AMORPHOUS URATES/PHOSPHATES    POCT PREGNANCY, URINE     Status: Abnormal   Collection Time    09/06/14  7:58 PM      Result Value Ref Range   Preg Test, Ur POSITIVE (*) NEGATIVE    Assessment and Plan   1. Poison ivy dermatitis    Benadryl BID Start [facility] as soon as possible Return to MAU as needed   Tawnya Crook 09/06/2014, 8:16 PM

## 2014-09-06 NOTE — Discharge Instructions (Signed)
Hiedra venenosa  °(Poison Ivy) °Luego de la exposición previa a la planta. La erupción suele aparecer 48 horas después de la exposición. Suelen ser bultos (pápulas) o ampollas (vesículas) en un patrón lineal. abrirse. Los ojos también podrían hincharse. Las hinchazón es peor por la mañana y mejora a medida que avanza el día. Deben tomarse todas las precauciones para prevenir una infección bacteriana (por gérmenes) secundaria, que puede ocasionar cicatrices. Mantenga todas las áreas abiertas secas, limpias y vendadas y cúbralas con un ungüento antibacteriano, en caso que lo necesite. Si no aparece una infección secundaria, esta dermatitis generalmente se cura dentro de las 2 o 3 semanas sin tratamiento. °INSTRUCCIONES PARA EL CUIDADO DOMICILIARIO °Lávese cuidadosamente con agua y jabón tan pronto como ocurra la exposición al tóxico. Tiene alrededor de media hora para retirar la resina de la planta antes de que le cause el sarpullido. El lavado destruirá rápidamente el aceite o antígeno que se encuentra sobre la piel y que podrá causar el sarpullido. Lave enérgicamente debajo de las uñas. Todo resto de resina seguirá diseminando el sarpullido. No se frote la piel vigorosamente cuando lava la zona afectada. La dermatitis no se extenderá si retira todo el aceite de la planta que haya quedado en su cuerpo. Un sarpullido que se ha transformado en lesiones que supuran (llagas) no diseminará el sarpullido, a menos que no se haya lavado cuidadosamente. También es importante lavar todas las prendas que haya utilizado. Pueden tener alérgenos activos. El sarpullido volverá, aún varios días más tarde. °La mejor medida es evitar el contacto con la planta en el futuro. La hiedra venenosa puede reconocerse por el número de hojas, En general, la hiedra venenosa tiene tres hojas con ramas floridas en un tallo simple. °Podrá adquirir difenhidramina que es un medicamento de venta libre, y utilizarlo según lo necesite para aliviar la  picazón. No conduzca automóviles si este medicamento le produce somnolencia. Consulte con el profesional que lo asiste acerca de los medicamentos que podrá administrarle a los niños. °SOLICITE ATENCIÓN MÉDICA SI: °· Observa áreas abiertas. °· Enrojecimiento que se extiende más allá de la zona del sarpullido. °· Una secreción purulenta (similar al pus). °· Aumento del dolor. °· Desarrolla otros signos de infección (como fiebre). °Document Released: 09/25/2005 Document Revised: 03/09/2012 °ExitCare® Patient Information ©2015 [employer]. This information is not intended to replace advice given to you by your health care provider. Make sure you discuss any questions you have with your health care provider. ° °

## 2014-10-10 ENCOUNTER — Other Ambulatory Visit (HOSPITAL_COMMUNITY): Payer: Self-pay | Admitting: Physician Assistant

## 2014-10-10 DIAGNOSIS — Z3689 Encounter for other specified antenatal screening: Secondary | ICD-10-CM

## 2014-10-10 LAB — OB RESULTS CONSOLE GC/CHLAMYDIA
CHLAMYDIA, DNA PROBE: NEGATIVE
Gonorrhea: NEGATIVE

## 2014-10-10 LAB — OB RESULTS CONSOLE RPR: RPR: NONREACTIVE

## 2014-10-10 LAB — OB RESULTS CONSOLE HIV ANTIBODY (ROUTINE TESTING): HIV: NONREACTIVE

## 2014-10-25 ENCOUNTER — Ambulatory Visit (HOSPITAL_COMMUNITY)
Admission: RE | Admit: 2014-10-25 | Discharge: 2014-10-25 | Disposition: A | Discharge status: Home / Self Care | Payer: Medicaid Other | Source: Ambulatory Visit | Attending: Physician Assistant | Admitting: Physician Assistant

## 2014-10-25 ENCOUNTER — Encounter (HOSPITAL_COMMUNITY): Payer: Self-pay

## 2014-10-25 DIAGNOSIS — Z3A20 20 weeks gestation of pregnancy: Secondary | ICD-10-CM | POA: Diagnosis not present

## 2014-10-25 DIAGNOSIS — Z36 Encounter for antenatal screening of mother: Secondary | ICD-10-CM | POA: Insufficient documentation

## 2014-10-25 DIAGNOSIS — Z3689 Encounter for other specified antenatal screening: Secondary | ICD-10-CM

## 2014-10-30 IMAGING — US US OB COMP +14 WK
2 series · 12 of 28 positions shown · non-contrast
Comparison: none

[Series 1: us ob comp +14 wk mfm · 3 of 15 slices shown (1 of 2)]
[im 3/15]
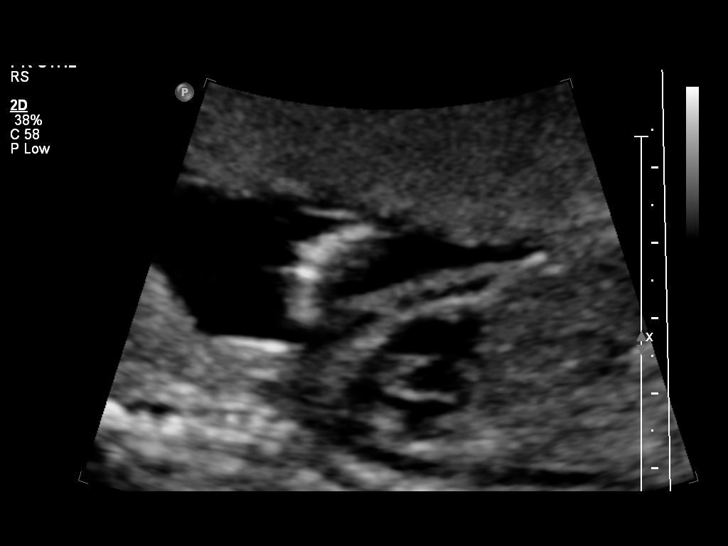
[im 9/15]
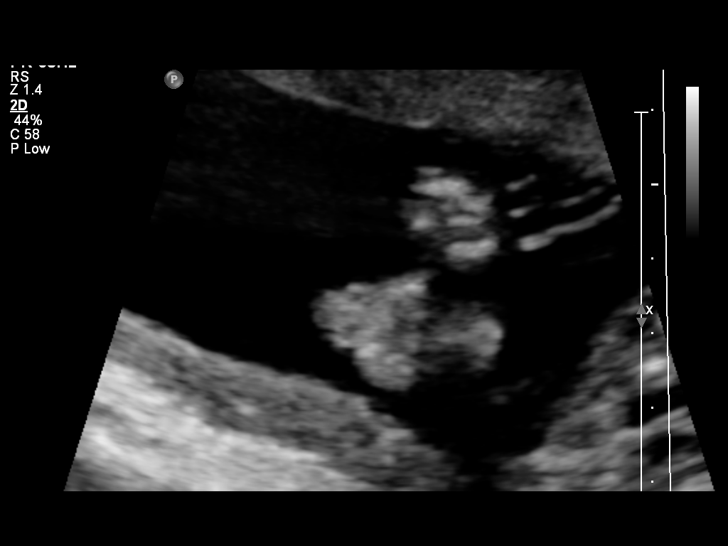
[im 15/15]
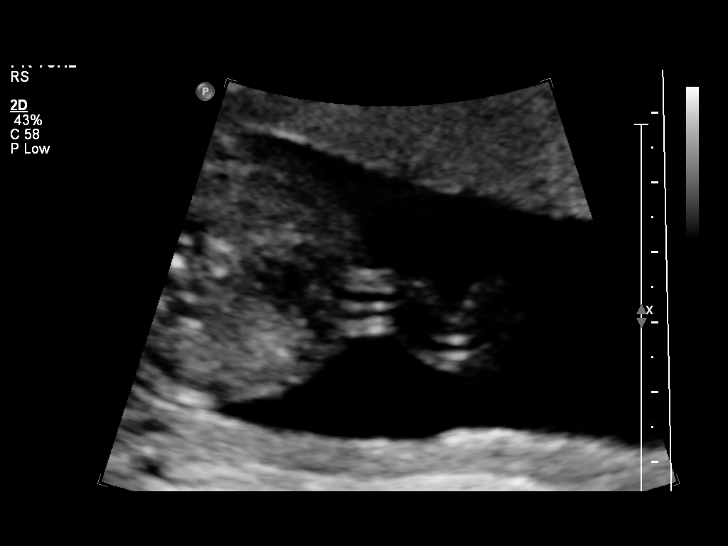

[Series 1: us ob comp +14 wk mfm · 9 of 55 slices shown (2 of 2)]
[im 6/55]
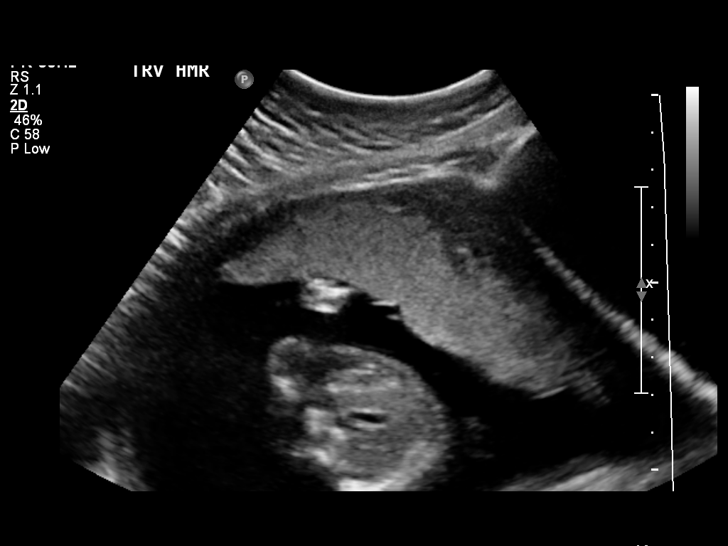
[im 11/55]
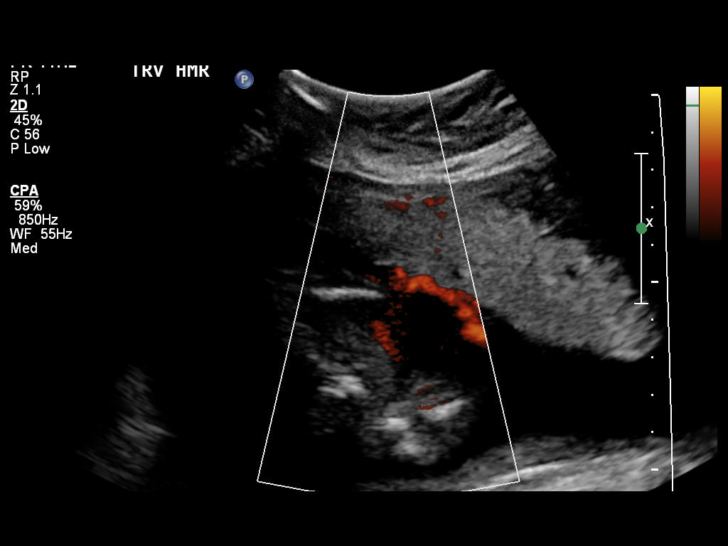
[im 16/55]
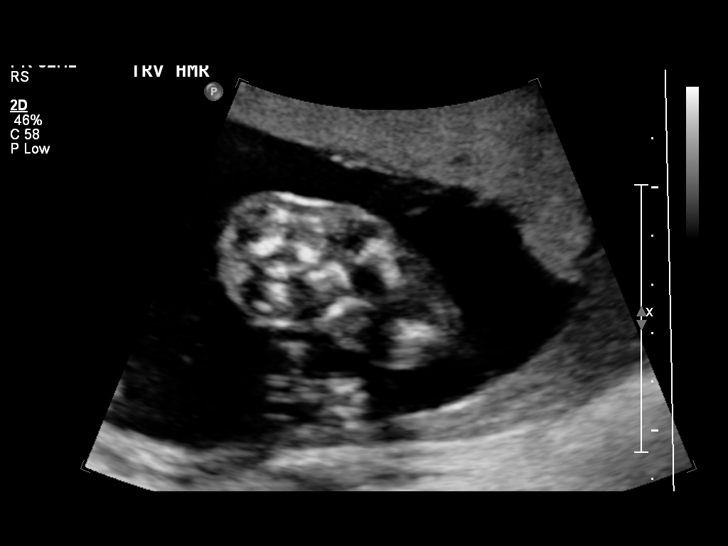
[im 24/55]
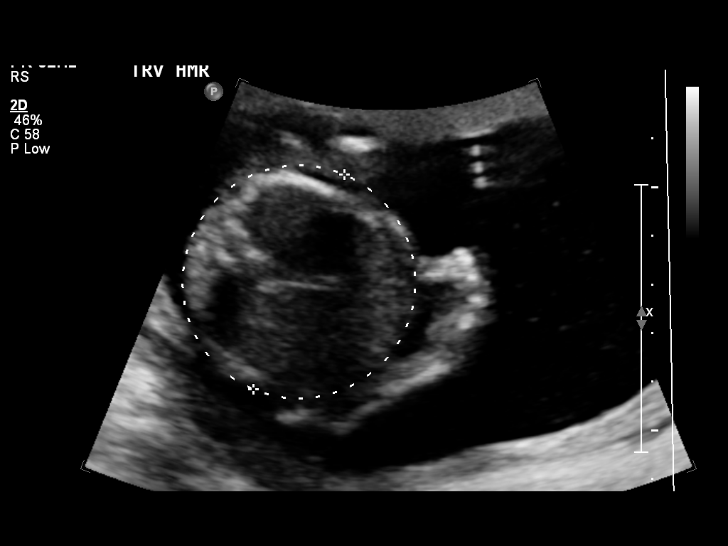
[im 29/55]
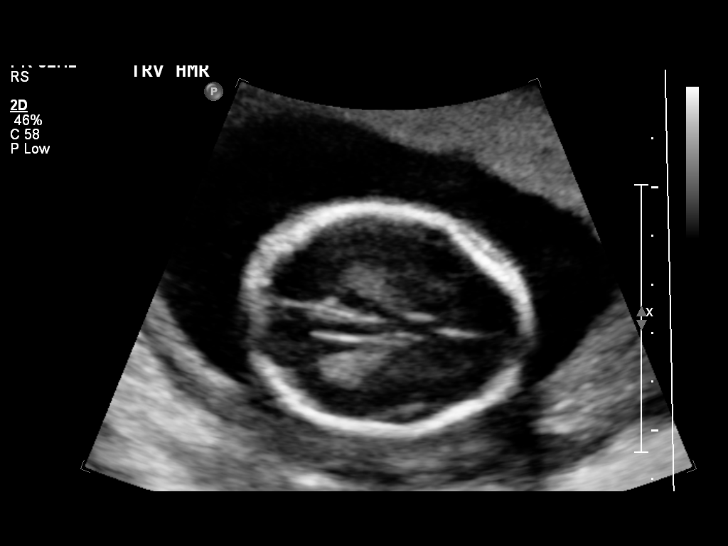
[im 34/55]
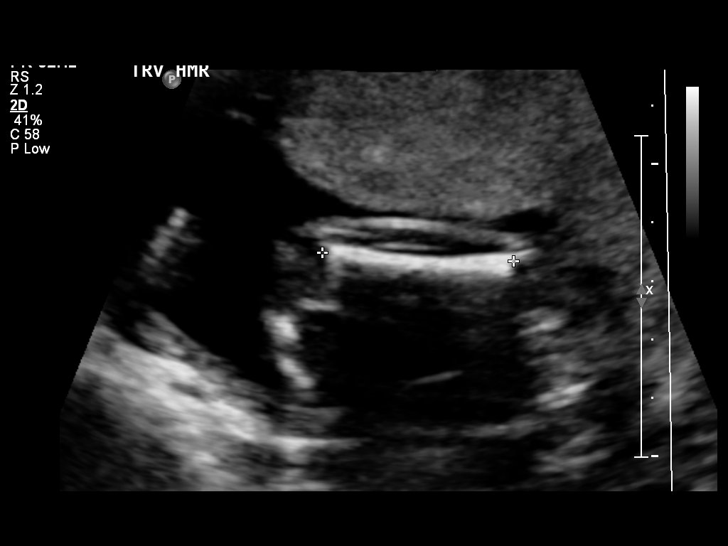
[im 42/55]
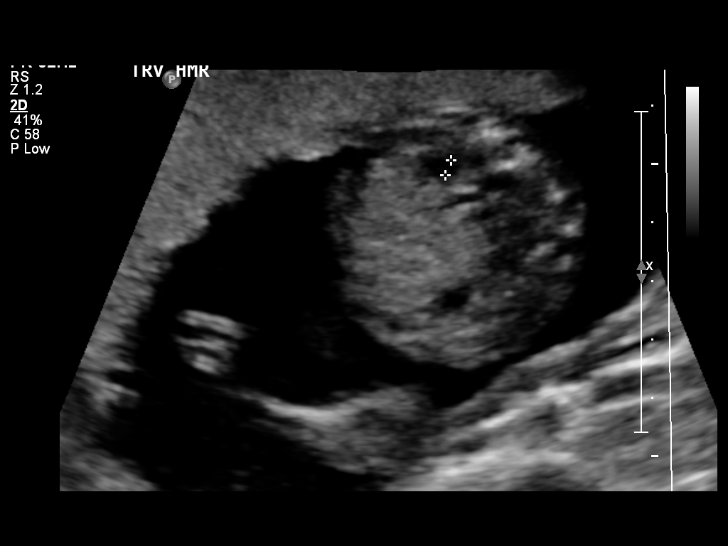
[im 47/55]
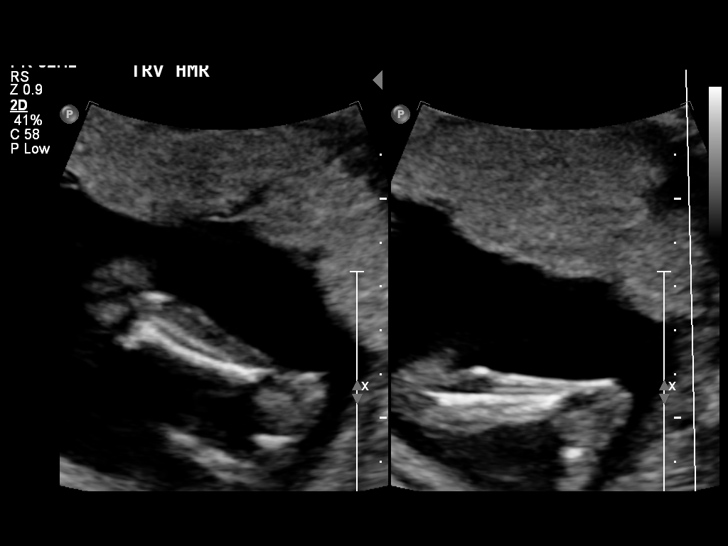
[im 52/55]
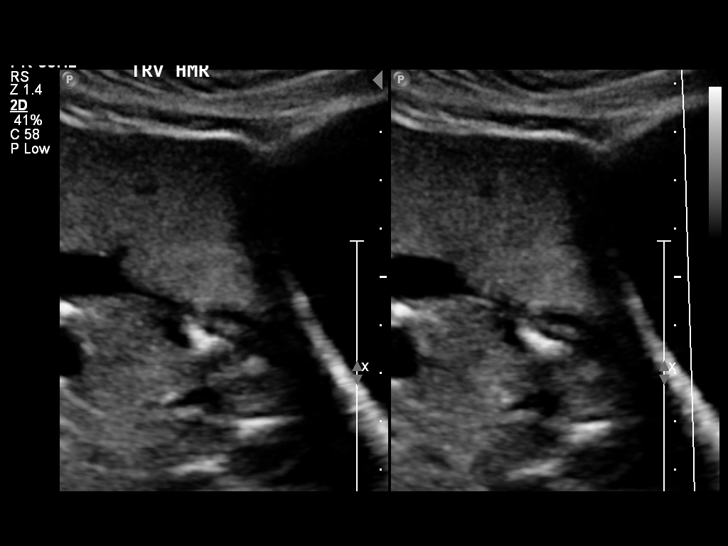

[12 of 28 positions shown; findings below may reference images not displayed]

OBSTETRICS REPORT
                      (Signed Final 10/25/2014 [DATE])

Service(s) Provided

 US OB COMP + 14 WK                                    76805.1
Indications

 Basic anatomic survey                                 z36
 20 weeks gestation of pregnancy
Fetal Evaluation

 Num Of Fetuses:    1
 Fetal Heart Rate:  153                          bpm
 Cardiac Activity:  Observed
 Presentation:      Transverse, head to
                    maternal right
 Placenta:          Anterior, above cervical os
 P. Cord            Visualized, central
 Insertion:

 Amniotic Fluid
 AFI FV:      Subjectively within normal limits
                                             Larg Pckt:     7.4  cm
Biometry

 BPD:     47.1  mm     G. Age:  20w 2d                CI:        74.06   70 - 86
                                                      FL/HC:      18.2   16.8 -

 HC:     173.8  mm     G. Age:  19w 6d       39  %    HC/AC:      1.15   1.09 -

 AC:     151.5  mm     G. Age:  20w 2d       57  %    FL/BPD:
 FL:      31.7  mm     G. Age:  19w 6d       38  %    FL/AC:      20.9   20 - 24
 HUM:     33.7  mm     G. Age:  21w 3d       92  %
 NFT:     4.34  mm

 Est. FW:     333  gm    0 lb 12 oz      51  %
Gestational Age

 LMP:           20w 0d        Date:  06/07/14                 EDD:   03/14/15
 U/S Today:     20w 1d                                        EDD:   03/13/15
 Best:          20w 0d     Det. By:  LMP  (06/07/14)          EDD:   03/14/15
Anatomy

 Cranium:          Appears normal         Aortic Arch:      Appears normal
 Fetal Cavum:      Appears normal         Ductal Arch:      Appears normal
 Ventricles:       Appears normal         Diaphragm:        Appears normal
 Choroid Plexus:   Appears normal         Stomach:          Appears normal, left
                                                            sided
 Cerebellum:       Appears normal         Abdomen:          Appears normal
 Posterior Fossa:  Appears normal         Abdominal Wall:   Appears nml (cord
                                                            insert, abd wall)
 Nuchal Fold:      Appears normal         Cord Vessels:     Appears normal (3
                                                            vessel cord)
 Face:             Appears normal         Kidneys:          Appear normal
                   (orbits and profile)
 Lips:             Appears normal         Bladder:          Appears normal
 Heart:            Not well visualized    Spine:            Appears normal
 RVOT:             Not well visualized    Lower             Appears normal
                                          Extremities:
 LVOT:             Not well visualized    Upper             Appears normal
                                          Extremities:

 Other:  Fetus appears to be a male. Heels visualized. Technically difficult due
         to fetal position.
Targeted Anatomy

 Fetal Central Nervous System
 Lat. Ventricles:  8.0                    Cisterna Magna:
Cervix Uterus Adnexa

 Cervical Length:    3.61     cm

 Cervix:       Normal appearance by transabdominal scan.
Impression

 SIUP at 20+0 weeks
 Normal detailed fetal anatomy; limited views of heart
 Markers of aneuploidy: none
 Normal amniotic fluid volume
 Measurements consistent with LMP dating
Recommendations

 Follow-up ultrasound in 6-8 weeks to complete anatomy
 survey

 questions or concerns.

## 2014-10-31 ENCOUNTER — Encounter (HOSPITAL_COMMUNITY): Payer: Self-pay

## 2014-11-28 ENCOUNTER — Other Ambulatory Visit (HOSPITAL_COMMUNITY): Payer: Self-pay | Admitting: Nurse Practitioner

## 2014-11-28 DIAGNOSIS — Z3689 Encounter for other specified antenatal screening: Secondary | ICD-10-CM

## 2014-12-19 ENCOUNTER — Ambulatory Visit (HOSPITAL_COMMUNITY): Payer: Medicaid Other

## 2014-12-20 ENCOUNTER — Ambulatory Visit (HOSPITAL_COMMUNITY): Admission: RE | Admit: 2014-12-20 | Payer: Medicaid Other | Source: Ambulatory Visit

## 2014-12-30 NOTE — L&D Delivery Note (Signed)
Patient is 29 y.o. G2P0001 8064w1d admitted in active labor with elevated blood pressures, no severe range and normal preEclampsia labs.   Delivery Note At 7:22 AM a viable female was delivered via Vaginal, Spontaneous Delivery (Presentation: ; Occiput Anterior).  APGAR: 9, 9; weight pending Placenta status: Intact, Spontaneous.  Cord: 3 vessels with the following complications: None.  Anesthesia: Epidural  Episiotomy: None Lacerations: None Suture Repair: n/a Est. Blood Loss (mL):  400mL  Mom to postpartum.  Baby to Couplet care / Skin to Skin.  Kristen Frank 03/08/2015, 7:48 AM

## 2015-01-02 ENCOUNTER — Ambulatory Visit (HOSPITAL_COMMUNITY): Payer: Medicaid Other

## 2015-01-06 ENCOUNTER — Ambulatory Visit (HOSPITAL_COMMUNITY)
Admission: RE | Admit: 2015-01-06 | Discharge: 2015-01-06 | Disposition: A | Discharge status: Home / Self Care | Payer: Medicaid Other | Source: Ambulatory Visit | Attending: Nurse Practitioner | Admitting: Nurse Practitioner

## 2015-01-06 DIAGNOSIS — Z36 Encounter for antenatal screening of mother: Secondary | ICD-10-CM | POA: Insufficient documentation

## 2015-01-06 DIAGNOSIS — Z3A30 30 weeks gestation of pregnancy: Secondary | ICD-10-CM | POA: Insufficient documentation

## 2015-01-06 DIAGNOSIS — Z3689 Encounter for other specified antenatal screening: Secondary | ICD-10-CM

## 2015-01-06 DIAGNOSIS — IMO0002 Reserved for concepts with insufficient information to code with codable children: Secondary | ICD-10-CM | POA: Insufficient documentation

## 2015-01-06 DIAGNOSIS — Z0489 Encounter for examination and observation for other specified reasons: Secondary | ICD-10-CM | POA: Insufficient documentation

## 2015-02-17 LAB — OB RESULTS CONSOLE GBS: STREP GROUP B AG: NEGATIVE

## 2015-03-08 ENCOUNTER — Inpatient Hospital Stay (HOSPITAL_COMMUNITY): Payer: Medicaid Other | Admitting: Anesthesiology

## 2015-03-08 ENCOUNTER — Inpatient Hospital Stay (HOSPITAL_COMMUNITY)
Admission: AD | Admit: 2015-03-08 | Discharge: 2015-03-09 | DRG: 775 | Disposition: A | Discharge status: Home / Self Care | Payer: Medicaid Other | Source: Ambulatory Visit | Attending: Family Medicine | Admitting: Family Medicine

## 2015-03-08 ENCOUNTER — Encounter (HOSPITAL_COMMUNITY): Payer: Self-pay | Admitting: *Deleted

## 2015-03-08 DIAGNOSIS — Z3A39 39 weeks gestation of pregnancy: Secondary | ICD-10-CM | POA: Diagnosis present

## 2015-03-08 DIAGNOSIS — O1490 Unspecified pre-eclampsia, unspecified trimester: Secondary | ICD-10-CM

## 2015-03-08 DIAGNOSIS — Z3403 Encounter for supervision of normal first pregnancy, third trimester: Secondary | ICD-10-CM | POA: Diagnosis present

## 2015-03-08 LAB — TYPE AND SCREEN
ABO/RH(D): O POS
ANTIBODY SCREEN: NEGATIVE

## 2015-03-08 LAB — CBC WITH DIFFERENTIAL/PLATELET
BASOS PCT: 0 % (ref 0–1)
Basophils Absolute: 0 10*3/uL (ref 0.0–0.1)
EOS ABS: 0.1 10*3/uL (ref 0.0–0.7)
Eosinophils Relative: 1 % (ref 0–5)
HCT: 41.7 % (ref 36.0–46.0)
Hemoglobin: 14 g/dL (ref 12.0–15.0)
LYMPHS PCT: 27 % (ref 12–46)
Lymphs Abs: 2.6 10*3/uL (ref 0.7–4.0)
MCH: 30 pg (ref 26.0–34.0)
MCHC: 33.6 g/dL (ref 30.0–36.0)
MCV: 89.3 fL (ref 78.0–100.0)
MONO ABS: 0.8 10*3/uL (ref 0.1–1.0)
Monocytes Relative: 8 % (ref 3–12)
Neutro Abs: 6.1 10*3/uL (ref 1.7–7.7)
Neutrophils Relative %: 64 % (ref 43–77)
Platelets: 202 10*3/uL (ref 150–400)
RBC: 4.67 MIL/uL (ref 3.87–5.11)
RDW: 13.9 % (ref 11.5–15.5)
WBC: 9.6 10*3/uL (ref 4.0–10.5)

## 2015-03-08 LAB — COMPREHENSIVE METABOLIC PANEL
ALBUMIN: 3.3 g/dL — AB (ref 3.5–5.2)
ALT: 19 U/L (ref 0–35)
ANION GAP: 8 (ref 5–15)
AST: 24 U/L (ref 0–37)
Alkaline Phosphatase: 228 U/L — ABNORMAL HIGH (ref 39–117)
BILIRUBIN TOTAL: 0.3 mg/dL (ref 0.3–1.2)
BUN: 16 mg/dL (ref 6–23)
CO2: 24 mmol/L (ref 19–32)
Calcium: 10.1 mg/dL (ref 8.4–10.5)
Chloride: 104 mmol/L (ref 96–112)
Creatinine, Ser: 0.7 mg/dL (ref 0.50–1.10)
Glucose, Bld: 96 mg/dL (ref 70–99)
Potassium: 4.2 mmol/L (ref 3.5–5.1)
Sodium: 136 mmol/L (ref 135–145)
TOTAL PROTEIN: 6.9 g/dL (ref 6.0–8.3)

## 2015-03-08 LAB — PROTEIN / CREATININE RATIO, URINE: CREATININE, URINE: 22.9 mg/dL

## 2015-03-08 LAB — RPR: RPR Ser Ql: NONREACTIVE

## 2015-03-08 MED ORDER — BISACODYL 10 MG RE SUPP: 10.0000 mg | Freq: Every day | RECTAL | Status: DC | PRN

## 2015-03-08 MED ORDER — SODIUM CHLORIDE 0.9 % IJ SOLN
3.0000 mL | INTRAMUSCULAR | Status: DC | PRN
Start: 2015-03-08 — End: 2015-03-09

## 2015-03-08 MED ORDER — SIMETHICONE 80 MG PO CHEW: 80.0000 mg | CHEWABLE_TABLET | ORAL | Status: DC | PRN

## 2015-03-08 MED ORDER — FENTANYL 2.5 MCG/ML BUPIVACAINE 1/10 % EPIDURAL INFUSION (WH - ANES)
14.0000 mL/h | INTRAMUSCULAR | Status: DC | PRN
  Administered 2015-03-08: 14 mL/h via EPIDURAL
  Filled 2015-03-08: qty 125

## 2015-03-08 MED ORDER — LACTATED RINGERS IV SOLN
INTRAVENOUS | Status: DC
Start: 2015-03-08 — End: 2015-03-08
  Administered 2015-03-08 (×2): via INTRAVENOUS

## 2015-03-08 MED ORDER — ONDANSETRON HCL 4 MG/2ML IJ SOLN: 4.0000 mg | Freq: Four times a day (QID) | INTRAMUSCULAR | Status: DC | PRN

## 2015-03-08 MED ORDER — PHENYLEPHRINE 40 MCG/ML (10ML) SYRINGE FOR IV PUSH (FOR BLOOD PRESSURE SUPPORT)
80.0000 ug | PREFILLED_SYRINGE | INTRAVENOUS | Status: DC | PRN
  Filled 2015-03-08: qty 2

## 2015-03-08 MED ORDER — OXYCODONE-ACETAMINOPHEN 5-325 MG PO TABS: 2.0000 | ORAL_TABLET | ORAL | Status: DC | PRN

## 2015-03-08 MED ORDER — LACTATED RINGERS IV SOLN: 500.0000 mL | INTRAVENOUS | Status: DC | PRN

## 2015-03-08 MED ORDER — OXYTOCIN 40 UNITS IN LACTATED RINGERS INFUSION - SIMPLE MED: 62.5000 mL/h | INTRAVENOUS | Status: DC | PRN

## 2015-03-08 MED ORDER — OXYTOCIN 40 UNITS IN LACTATED RINGERS INFUSION - SIMPLE MED
62.5000 mL/h | INTRAVENOUS | Status: DC
  Filled 2015-03-08: qty 1000

## 2015-03-08 MED ORDER — OXYCODONE-ACETAMINOPHEN 5-325 MG PO TABS: 1.0000 | ORAL_TABLET | ORAL | Status: DC | PRN

## 2015-03-08 MED ORDER — FENTANYL CITRATE 0.05 MG/ML IJ SOLN: 100.0000 ug | INTRAMUSCULAR | Status: DC | PRN

## 2015-03-08 MED ORDER — DIBUCAINE 1 % RE OINT: 1.0000 "application " | TOPICAL_OINTMENT | RECTAL | Status: DC | PRN

## 2015-03-08 MED ORDER — LACTATED RINGERS IV SOLN
500.0000 mL | Freq: Once | INTRAVENOUS | Status: AC
  Administered 2015-03-08: 500 mL via INTRAVENOUS

## 2015-03-08 MED ORDER — ONDANSETRON HCL 4 MG PO TABS: 4.0000 mg | ORAL_TABLET | ORAL | Status: DC | PRN

## 2015-03-08 MED ORDER — BENZOCAINE-MENTHOL 20-0.5 % EX AERO: 1.0000 "application " | INHALATION_SPRAY | CUTANEOUS | Status: DC | PRN

## 2015-03-08 MED ORDER — CITRIC ACID-SODIUM CITRATE 334-500 MG/5ML PO SOLN: 30.0000 mL | ORAL | Status: DC | PRN

## 2015-03-08 MED ORDER — EPHEDRINE 5 MG/ML INJ
10.0000 mg | INTRAVENOUS | Status: DC | PRN
  Filled 2015-03-08: qty 2

## 2015-03-08 MED ORDER — FENTANYL 2.5 MCG/ML BUPIVACAINE 1/10 % EPIDURAL INFUSION (WH - ANES): 14.0000 mL/h | INTRAMUSCULAR | Status: DC | PRN

## 2015-03-08 MED ORDER — LIDOCAINE HCL (PF) 1 % IJ SOLN
INTRAMUSCULAR | Status: DC | PRN
  Administered 2015-03-08: 4 mL
  Administered 2015-03-08: 6 mL

## 2015-03-08 MED ORDER — OXYTOCIN BOLUS FROM INFUSION
500.0000 mL | INTRAVENOUS | Status: DC
  Administered 2015-03-08: 500 mL via INTRAVENOUS

## 2015-03-08 MED ORDER — LIDOCAINE HCL (PF) 1 % IJ SOLN
30.0000 mL | INTRAMUSCULAR | Status: DC | PRN
  Filled 2015-03-08: qty 30

## 2015-03-08 MED ORDER — SODIUM CHLORIDE 0.9 % IJ SOLN: 3.0000 mL | Freq: Two times a day (BID) | INTRAMUSCULAR | Status: DC

## 2015-03-08 MED ORDER — PHENYLEPHRINE 40 MCG/ML (10ML) SYRINGE FOR IV PUSH (FOR BLOOD PRESSURE SUPPORT)
80.0000 ug | PREFILLED_SYRINGE | INTRAVENOUS | Status: DC | PRN
  Filled 2015-03-08: qty 2
  Filled 2015-03-08: qty 20

## 2015-03-08 MED ORDER — LANOLIN HYDROUS EX OINT: TOPICAL_OINTMENT | CUTANEOUS | Status: DC | PRN

## 2015-03-08 MED ORDER — FLEET ENEMA 7-19 GM/118ML RE ENEM: 1.0000 | ENEMA | Freq: Every day | RECTAL | Status: DC | PRN

## 2015-03-08 MED ORDER — DIPHENHYDRAMINE HCL 25 MG PO CAPS: 25.0000 mg | ORAL_CAPSULE | Freq: Four times a day (QID) | ORAL | Status: DC | PRN

## 2015-03-08 MED ORDER — ACETAMINOPHEN 325 MG PO TABS: 650.0000 mg | ORAL_TABLET | ORAL | Status: DC | PRN

## 2015-03-08 MED ORDER — DIPHENHYDRAMINE HCL 50 MG/ML IJ SOLN: 12.5000 mg | INTRAMUSCULAR | Status: DC | PRN

## 2015-03-08 MED ORDER — SENNOSIDES-DOCUSATE SODIUM 8.6-50 MG PO TABS
2.0000 | ORAL_TABLET | ORAL | Status: DC
  Administered 2015-03-08: 2 via ORAL
  Filled 2015-03-08: qty 2

## 2015-03-08 MED ORDER — SODIUM CHLORIDE 0.9 % IV SOLN: 250.0000 mL | INTRAVENOUS | Status: DC | PRN

## 2015-03-08 MED ORDER — PRENATAL MULTIVITAMIN CH
1.0000 | ORAL_TABLET | Freq: Every day | ORAL | Status: DC
  Administered 2015-03-08 – 2015-03-09 (×2): 1 via ORAL
  Filled 2015-03-08 (×2): qty 1

## 2015-03-08 MED ORDER — WITCH HAZEL-GLYCERIN EX PADS
1.0000 | MEDICATED_PAD | CUTANEOUS | Status: DC | PRN
Start: 2015-03-08 — End: 2015-03-09

## 2015-03-08 MED ORDER — IBUPROFEN 600 MG PO TABS
600.0000 mg | ORAL_TABLET | Freq: Four times a day (QID) | ORAL | Status: DC
  Administered 2015-03-08 – 2015-03-09 (×5): 600 mg via ORAL
  Filled 2015-03-08 (×5): qty 1

## 2015-03-08 MED ORDER — ONDANSETRON HCL 4 MG/2ML IJ SOLN: 4.0000 mg | INTRAMUSCULAR | Status: DC | PRN

## 2015-03-08 MED ORDER — ZOLPIDEM TARTRATE 5 MG PO TABS: 5.0000 mg | ORAL_TABLET | Freq: Every evening | ORAL | Status: DC | PRN

## 2015-03-08 NOTE — Anesthesia Procedure Notes (Signed)

## 2015-03-08 NOTE — Lactation Note (Signed)
This note was copied from the chart of Kristen Marcelino DusterHilda Inghram. Lactation Consultation Note Initial visit at 9 hours of age.  Mom reports baby is feeding well.  Baby is now asleep in the crib and FOB is dressing baby. Baby has had 2 feedings with a void and stool.  Discussed benefits of exclusively breatfeeding.  [facility] resources given and discussed.  Encouraged to feed with early cues on demand.  Early newborn behavior discussed.  Offered to assist with hand expression and mom declines, mom reports she remembers from experience with older child.  Mom to call for assist as needed.    Patient Name: Kristen [name] Kristen [name]: 03/08/2015 Reason for consult: Initial assessment   Maternal Data Has patient been taught Hand Expression?:  (mom declines demonstration, but remember from older daughter) Does the patient have breastfeeding experience prior to this delivery?: Yes  Feeding Feeding Type: Breast Fed Length of feed: 16 min  LATCH Score/Interventions                Intervention(s): Breastfeeding basics reviewed     Lactation Tools Discussed/Used WIC Program: Yes   Consult Status Consult Status: Follow-up [name]: 03/09/15 Follow-up type: In-patient    Beverely RisenShoptaw, Arvella MerlesJana Lynn 03/08/2015, 4:58 PM

## 2015-03-08 NOTE — Progress Notes (Signed)
Patient did not want to order lunch. Eda H Royal  Interpreter. °

## 2015-03-08 NOTE — H&P (Signed)
Maternal Medical History:  Reason for admission: Nausea.      Patient is 29 y.o. G2P0001 [redacted]w[redacted]d, health dept patient, here with complaints of contractions occuring every 10 minutes.  She reports onset of contractions approx 24 hrs prior to presentation, with some blood-tinged mucous at that time.  Contraction pain is located suprapubic, wraps around to back, lasts 30 seconds.  No gush of fluid or blood per vagina.  Other symptoms include occasional blurred vision, some lower extremity edema which has decreased, no headache.    History significant for Herpes, no current outbreak, she is on valtrex  BID.   Previous term pregnancy significant for SVD; IOL after SROM, pre-eclampsia. Daughter now 11 years old.   OB History    Gravida Para Term Preterm AB TAB SAB Ectopic Multiple Living   2 1 0       1     Past Medical History  Diagnosis Date  . No pertinent past medical history   . HSV-2 infection   . Medical history non-contributory    History reviewed. No pertinent past surgical history. Family History: family history includes Asthma in her brother. Social History:  reports that she has never smoked. She has never used smokeless tobacco. She reports that she does not drink alcohol or use illicit drugs.   Prenatal Transfer Tool  Maternal Diabetes: No Genetic Screening: Neg Maternal Ultrasounds/Referrals:Normal Fetal Ultrasounds or other Referrals: Unknown Maternal Substance Abuse:  No Significant Maternal Medications:  Meds include: Other: Valtrex Significant Maternal Lab Results: H/o HSV, GBS neg Other Comments:   Review of Systems  Constitutional: Negative for fever and chills.  Eyes: Positive for blurred vision. Negative for pain.  Respiratory: Negative for shortness of breath.   Cardiovascular: Negative for chest pain, palpitations and leg swelling.  Gastrointestinal: Negative for nausea, vomiting, abdominal pain and diarrhea.  Genitourinary: Negative for dysuria and  hematuria.  Neurological: Negative for dizziness, loss of consciousness and headaches.    Dilation: 3.5 Effacement (%): 90 Station: -2 Exam by:: Restaurant manager, fast food  Blood pressure 136/93, pulse 76, temperature 98.1 F (36.7 C), temperature source Oral, resp. rate 18, last menstrual period 06/07/2014, currently breastfeeding. Exam Physical Exam  Constitutional: She is oriented to person, place, and time. She appears well-developed and well-nourished. No distress.  HENT:  Head: Normocephalic and atraumatic.  Eyes: Conjunctivae and EOM are normal. Pupils are equal, round, and reactive to light.  Neck: Normal range of motion.  Cardiovascular: Normal rate and intact distal pulses.   Respiratory: Effort normal. No respiratory distress.  GI: Soft. She exhibits no distension. There is no tenderness.  Musculoskeletal: Normal range of motion. She exhibits no edema.  Neurological: She is alert and oriented to person, place, and time. Coordination normal.  Skin: Skin is warm and dry. She is not diaphoretic.  Psychiatric: She has a normal mood and affect. Her behavior is normal.  Initial cervical exam: 3.5/90/-2.  No visible lesions  Neuro exam: with 3+ DTRs, HA only with ctx  Prenatal labs: ABO, Rh:   O pos Antibody:  Neg Rubella:   immune RPR:    NR HBsAg:   NR HIV:   neg GBS:   neg  (02/17/15) GC/CT: neg (02/17/15)  Assessment/Plan: A: Patient is 29 y.o. G2P0001 [redacted]w[redacted]d here with complaints of contractions     FHR with baseline of 140, with mild variability     Occasional diastolic BP >90     Cervical change from 3.5 to 4cm, with SROM while in MAU  PIH labs, given occasional elevated diastolic pressures, Urine Pr/Cr, CMP, CBC were all wnl  P: Admit for spontaneous labor, progressing normally      Expectant management      Pain mgmt plan: Pt plans for epidural       Contraception plan: Nexplanon      Baby's sex: female, no circumcision      Feeding: Plans to  breastfeed  Henson,Amber 03/08/2015, 1:58 AM    OB fellow attestation:  I have seen and examined this patient; I agree with above documentation in the resident's note.   Belia HemanHilda Rea Noboa is a 29 y.o. G2P0001 here in active labor, ruptured in MAU  PE: BP 109/62 mmHg  Pulse 86  Temp(Src) 99 F (37.2 C) (Oral)  Resp 18  Ht 5\' 1"  (1.549 m)  Wt 185 lb (83.915 kg)  BMI 34.97 kg/m2  SpO2 96%  LMP 06/07/2014 (Approximate) Gen: calm comfortable, NAD Resp: normal effort, no distress Abd: gravid  ROS, labs, PMH reviewed  Plan: MOF: breast MOC: nexpl ID: GBS neg FWB: cat I Labor: expectant mgmt Circ: declines Pain: epidural  Ardit Danh ROCIO 03/08/2015, 8:00 AM

## 2015-03-08 NOTE — Anesthesia Preprocedure Evaluation (Signed)

## 2015-03-08 NOTE — MAU Note (Addendum)
Pt presents with contractions every 10 minutes. Denies SROM/LOF. + fetal movement. Bloody show only. Denies any complications in pregnancy.

## 2015-03-08 NOTE — Progress Notes (Signed)
I check on patients need I ordered her dinner, snack and breakfast, by Orlan LeavensViria Alvarez Spanish Interpreter

## 2015-03-08 NOTE — Progress Notes (Signed)
UR chart review completed.  

## 2015-03-09 NOTE — Progress Notes (Signed)
I check on patients need, I ordered her dinner, snack and breakfast, by Viria Alvarez Spanish Interpreter °

## 2015-03-09 NOTE — Discharge Summary (Signed)
Obstetric Discharge Summary Reason for Admission: onset of labor Prenatal Procedures: none Intrapartum Procedures: spontaneous vaginal delivery Postpartum Procedures: none Complications-Operative and Postpartum: none   Patient is 29 y.o. [id number], health dept patient, admitted in active labor with contractions.  History significant for Herpes, no current outbreak, she is on valtrex 500mg  BID.   At 7:22 AM on 03/08/2015 a viable female was delivered via Vaginal, Spontaneous Delivery.  APGAR: 9, 9.  Anesthesia: Epidural.  Lacerations: None.  HEMOGLOBIN  Date Value Ref Range Status  03/08/2015 14.0 12.0 - 15.0 g/dL Final   HCT  Date Value Ref Range Status  03/08/2015 41.7 36.0 - 46.0 % Final    Physical Exam:  General: alert, cooperative and no distress Lochia: appropriate Uterine Fundus: firm Incision: None DVT Evaluation: No evidence of DVT seen on physical exam.  Discharge Diagnoses: Term Pregnancy-delivered  Discharge Information: Date: 03/09/2015 Activity: pelvic rest Diet: routine Medications: Ibuprofen Condition: stable Instructions: refer to practice specific booklet Discharge to: home Follow-up Information    [facility] [place]. Schedule an appointment as soon as possible for a visit in 4 weeks.   Contact information:   [address] [place] [id number] [phone]      Newborn Data: Live born female  Birth Weight: 7 lb 0.9 oz (3200 g) APGAR: 9, 9  Contraception: nexplanon, outpatient Feeding: bottle Circumcisionl: none planned  Henson,Amber 03/09/2015, 8:56 AM   I have seen and examined this patient and I agree with the above. Cam HaiSHAW, Renie Stelmach CNM 9:39 AM 03/09/2015

## 2015-03-09 NOTE — Progress Notes (Signed)
I assisted Engineer, technical salesCharletta CRNA with some questions, by Orlan LeavensViria Alvarez Spanish Interpreter

## 2015-03-09 NOTE — Anesthesia Postprocedure Evaluation (Signed)
  Anesthesia Post-op Note  Patient: Kristen Frank  Procedure(s) Performed: * No procedures listed *  Patient Location: PACU and Mother/Baby  Anesthesia Type:Epidural  Level of Consciousness: awake, alert  and oriented  Airway and Oxygen Therapy: Patient Spontanous Breathing  Post-op Pain: mild  Post-op Assessment: Post-op Vital signs reviewed  Post-op Vital Signs: Reviewed and stable  Last Vitals:  Filed Vitals:   03/09/15 0629  BP: 116/68  Pulse: 75  Temp: 36.7 C  Resp: 18    Complications: No apparent anesthesia complications

## 2015-03-09 NOTE — Progress Notes (Signed)
Patient does not wish to order lunch. Kristen Frank  Interpreter.

## 2015-03-09 NOTE — Progress Notes (Signed)
On admission to Mother baby unit, spoke with patient in [place]. Offered patient interpreter in which patient declined. Instructed patient that if at any time she felt she needed an interpreter, to ask. Patient engaging in conversation and responding appropriately. Earl Galasborne, Linda HedgesStefanie BellevueHudspeth

## 2015-11-18 ENCOUNTER — Encounter (HOSPITAL_COMMUNITY): Payer: Self-pay | Admitting: Emergency Medicine

## 2015-11-18 ENCOUNTER — Emergency Department (HOSPITAL_COMMUNITY)
Admission: EM | Admit: 2015-11-18 | Discharge: 2015-11-18 | Disposition: A | Discharge status: Home / Self Care | Payer: Medicaid Other | Attending: Emergency Medicine | Admitting: Emergency Medicine

## 2015-11-18 DIAGNOSIS — Z3A00 Weeks of gestation of pregnancy not specified: Secondary | ICD-10-CM | POA: Diagnosis not present

## 2015-11-18 DIAGNOSIS — H00023 Hordeolum internum right eye, unspecified eyelid: Secondary | ICD-10-CM | POA: Insufficient documentation

## 2015-11-18 DIAGNOSIS — O9989 Other specified diseases and conditions complicating pregnancy, childbirth and the puerperium: Secondary | ICD-10-CM | POA: Diagnosis not present

## 2015-11-18 DIAGNOSIS — H109 Unspecified conjunctivitis: Secondary | ICD-10-CM | POA: Diagnosis not present

## 2015-11-18 DIAGNOSIS — Z8619 Personal history of other infectious and parasitic diseases: Secondary | ICD-10-CM | POA: Insufficient documentation

## 2015-11-18 MED ORDER — ERYTHROMYCIN 5 MG/GM OP OINT
1.0000 "application " | TOPICAL_OINTMENT | Freq: Once | OPHTHALMIC | Status: AC
  Administered 2015-11-18: 1 via OPHTHALMIC
  Filled 2015-11-18: qty 3.5

## 2015-11-18 NOTE — ED Notes (Signed)
Pt. reports right eye pain with redness and drainage onset yesterday morning , denies injury / no vision loss.

## 2015-11-18 NOTE — Discharge Instructions (Signed)
Orzuelo (Stye) Apply warm compresses to the eyes several times a day. Un orzuelo es un bulto en el prpado causado por una infeccin bacteriana. Puede formarse dentro del prpado (orzuelo interno) o fuera del prpado (orzuelo externo). Un orzuelo interno puede ser causado por una infeccin en una glndula sebcea dentro del prpado. Un orzuelo externo puede estar causado por una infeccin en la base de la pestaa (folculo piloso). Los orzuelos son muy frecuentes. Todas las personas pueden tener orzuelos a Actuarycualquier edad. Suelen ocurrir solo en un ojo, Biomedical engineerpero puede tener ms de Inteluno en los dos ojos.  CAUSAS  La infeccin casi siempre es causada por una bacteria llamada Staphylococcus aureus, que es un tipo comn de bacteria que vive en la piel. FACTORES DE RIESGO Puede tener un riesgo ms alto de sufrir un orzuelo si ya ha tenido [place]. Tambin puede tener un riesgo ms alto si tiene:  Diabetes.  Una enfermedad crnica.  Enrojecimiento prolongado en los ojos.  Una afeccin cutnea denominada seborrea.  Niveles altos de grasa en la sangre (lpidos). SIGNOS Y SNTOMAS  El dolor en el prpado es el sntoma ms frecuente del [place]. Los orzuelos internos son ms dolorosos que los externos. Otros signos y sntomas pueden incluir los siguientes:  Hinchazn dolorosa del prpado.  Sensacin de [employer] Grouppicazn en el ojo.  Lagrimeo y enrojecimiento del ojo.  Pus que drena del orzuelo. DIAGNSTICO  Con tan solo examinarle el ojo, el mdico puede diagnosticarle un [place]zuelo. Tambin puede revisarlo para asegurarse de que:  No tenga fiebre ni otros signos de una infeccin ms grave.  La infeccin no se haya diseminado a otras partes del ojo o a zonas circundantes. TRATAMIENTO  La mayora de los orzuelos desparecen en unos das sin [place] Valleytratamiento. En algunos casos, puede necesitar antibiticos en gotas o ungento para prevenir la infeccin. Es posible que el mdico deba drenar el orzuelo por va quirrgica si  este:  Es grande.  Causa mucho dolor.  Interfiere con la visin. Esto se puede realizar con un instrumento cortante de hoja delgada o una aguja.  INSTRUCCIONES PARA EL CUIDADO EN EL HOGAR   Tome los medicamentos solamente como se lo haya indicado el mdico.  Aplique una compresa limpia y caliente sobre ojo durante 10minutos, 4veces al Futures traderda.  No use lentes de contacto ni maquillaje para los ojos [place] que el orzuelo se haya curado.  No trate de reventar o drenar el orzuelo. SOLICITE ATENCIN MDICA SI:  Tiene escalofros o fiebre.  El orzuelo no desaparece despus de [address] das.  El orzuelo afecta la visin.  Comienza a Psychiatristsentir dolor en el globo ocular, o se le hincha o enrojece. ASEGRESE DE QUE:  Comprende estas instrucciones.  Controlar su afeccin.  Recibir ayuda de inmediato si no mejora o si empeora.   Esta informacin no tiene Theme park managercomo fin reemplazar el consejo del mdico. Asegrese de hacerle al mdico cualquier pregunta que tenga.   Document Released: 09/25/2005 Document Revised: 01/06/2015 Elsevier Interactive Patient Education [employer]2016 Elsevier Inc.

## 2015-11-18 NOTE — ED Provider Notes (Signed)
CSN: [id number]     Arrival date & time 11/18/15  [id number] History   First MD Initiated Contact with Patient 11/18/15 [phone]     Chief Complaint  Patient presents with  . Conjunctivitis     (Consider location/radiation/quality/duration/timing/severity/associated sxs/prior Treatment) Patient is a 29 y.o. female presenting with conjunctivitis. The history is provided by the patient. No language interpreter was used.  Conjunctivitis Pertinent negatives include no coughing, fever or sore throat.  Kristen Frank is a 29 year old pregnant female who presents with right eye redness, drainage, and pain since yesterday morning. She also reports blurry vision. She denies any contact lens use. She does not wear glasses. She denies a previous episode of this. She denies sick contacts. She denies any fever, chills, sore throat, cough, shortness of breath.   Past Medical History  Diagnosis Date  . No pertinent past medical history   . HSV-2 infection   . Medical history non-contributory    History reviewed. No pertinent past surgical history. Family History  Problem Relation Age of Onset  . Asthma Brother    Social History  Substance Use Topics  . Smoking status: Never Smoker   . Smokeless tobacco: Never Used  . Alcohol Use: No   OB History    Gravida Para Term Preterm AB TAB SAB Ectopic Multiple Living   3 2 1       0 2     Review of Systems  Constitutional: Negative for fever.  HENT: Negative for ear pain and sore throat.   Eyes: Positive for pain, discharge and redness. Negative for photophobia.  Respiratory: Negative for cough and shortness of breath.       Allergies  Review of patient's allergies indicates no known allergies.  Home Medications   Prior to Admission medications   Not on File   BP 101/65 mmHg  Pulse 82  Temp(Src) 98.2 F (36.8 C) (Oral)  Resp 14  SpO2 100%  LMP 06/07/2014 (Approximate) Physical Exam  Constitutional: She is oriented to person, place, and time.  She appears well-developed and well-nourished. No distress.  HENT:  Head: Normocephalic and atraumatic.  Eyes: EOM are normal. Pupils are equal, round, and reactive to light. Right conjunctiva is injected.  Inflammation of the right upper eyelid and gland just under the conjunctival side of the eyelid. She has crusting and drainage of the right eye. No foreign body. Extraocular movements intact. Pupils are reactive and round to light and accommodation.  Neck: Neck supple.  Cardiovascular: Normal rate, regular rhythm and normal heart sounds.   Pulmonary/Chest: Effort normal and breath sounds normal. No respiratory distress.  Musculoskeletal: Normal range of motion.  Neurological: She is alert and oriented to person, place, and time.  Skin: Skin is dry.  Nursing note and vitals reviewed.   ED Course  Procedures (including critical care time) Labs Review Labs Reviewed - No data to display  Imaging Review No results found.   EKG Interpretation None       Visual Acuity  Right Eye Distance: 20/100 Left Eye Distance: 20/50 Bilateral Distance: 20/70  Right Eye Near:   Left Eye Near:    Bilateral Near:      MDM   Final diagnoses:  Internal hordeolum of right eye  Patient presents for right eye pain, drainage, and redness.  I do not suspect preorbital or orbital cellulitis. She does not appear to be conjunctivitis either. It is most likely irritation from an internal hordeolum. Either way, erythromycin was prescribed and will cover  both. Her visual acuity in the right eye is 20/100 but is most likely due to the swelling and inability to open the eye. I gave the patient return precautions as well as ophthalmology follow-up. She seemed reluctant to follow-up and did not want to come back for recheck. Medications  erythromycin ophthalmic ointment 1 application (1 application Right Eye Given 11/18/15 0613)   Filed Vitals:   11/18/15 0644  BP: 101/65  Pulse: 82  Temp: 98.2 F (36.8  C)  Resp: [address], PA-C 11/18/15 1235  Tomasita Crumble, MD 11/18/15 1629

## 2015-12-31 NOTE — L&D Delivery Note (Signed)
Patient is 30 y.o. [id number] [redacted]w[redacted]d admitted for SOL, hx of HSV-2.  No augmentation of labor.    Delivery Note At 8:41 PM a viable and healthy female was delivered via  (Presentation: cephalic ; right occiput anterior  ).  APGAR: 8, 9; weight  .   Placenta status: intact, marginal insertion .  Cord: 3V  with the following complications: none  Anesthesia:  epidural Episiotomy:  none Lacerations:  none Suture Repair: n/a Est. Blood Loss (mL):  200  Mom to postpartum.  Baby to Couplet care / Skin to Skin.  Amber Heckart 02/09/2016, 8:58 PM      Upon arrival patient was complete and pushing. She pushed with good maternal effort to deliver a healthy baby boy. Baby delivered without difficulty, was noted to have good tone and place on maternal abdomen for oral suctioning, drying and stimulation. Delayed cord clamping performed. Placenta delivered intact with 3V cord. Vaginal canal and perineum was inspected and no lacerations present; hemostatic. Pitocin was started and uterus massaged until bleeding slowed. Counts of sharps, instruments, and lap pads were all correct.   Wynne Dust, MD, PGY-1  Patient is a [id number] at [redacted]w[redacted]d who was admitted in SOL, essentially uncomplicated prenatal course.  She progressed with augmentation.  I was gloved and present for delivery in its entirety.  Second stage of labor progressed, baby delivered after a couple of contractions.  Mild decels during second stage noted.  Complications: none  Lacerations: none  EBL: 200cc  Tine Mabee, CNM 12:27 AM

## 2016-02-05 ENCOUNTER — Encounter (HOSPITAL_COMMUNITY): Payer: Self-pay | Admitting: *Deleted

## 2016-02-09 ENCOUNTER — Inpatient Hospital Stay (HOSPITAL_COMMUNITY): Payer: Medicaid Other | Admitting: Anesthesiology

## 2016-02-09 ENCOUNTER — Encounter (HOSPITAL_COMMUNITY): Payer: Self-pay | Admitting: *Deleted

## 2016-02-09 ENCOUNTER — Inpatient Hospital Stay (HOSPITAL_COMMUNITY)
Admission: AD | Admit: 2016-02-09 | Discharge: 2016-02-10 | DRG: 774 | Disposition: A | Discharge status: Home / Self Care | Payer: Medicaid Other | Source: Ambulatory Visit | Attending: Family Medicine | Admitting: Family Medicine

## 2016-02-09 DIAGNOSIS — Z3A38 38 weeks gestation of pregnancy: Secondary | ICD-10-CM

## 2016-02-09 DIAGNOSIS — O9832 Other infections with a predominantly sexual mode of transmission complicating childbirth: Secondary | ICD-10-CM | POA: Diagnosis present

## 2016-02-09 DIAGNOSIS — A6000 Herpesviral infection of urogenital system, unspecified: Secondary | ICD-10-CM | POA: Diagnosis present

## 2016-02-09 DIAGNOSIS — IMO0001 Reserved for inherently not codable concepts without codable children: Secondary | ICD-10-CM

## 2016-02-09 LAB — URINALYSIS, ROUTINE W REFLEX MICROSCOPIC
Bilirubin Urine: NEGATIVE
Glucose, UA: NEGATIVE mg/dL
Ketones, ur: NEGATIVE mg/dL
LEUKOCYTES UA: NEGATIVE
Nitrite: NEGATIVE
PROTEIN: NEGATIVE mg/dL
Specific Gravity, Urine: 1.02 (ref 1.005–1.030)
pH: 6.5 (ref 5.0–8.0)

## 2016-02-09 LAB — URINE MICROSCOPIC-ADD ON

## 2016-02-09 LAB — CBC
HCT: 41.5 % (ref 36.0–46.0)
Hemoglobin: 14.3 g/dL (ref 12.0–15.0)
MCH: 30.6 pg (ref 26.0–34.0)
MCHC: 34.5 g/dL (ref 30.0–36.0)
MCV: 88.7 fL (ref 78.0–100.0)
PLATELETS: 229 10*3/uL (ref 150–400)
RBC: 4.68 MIL/uL (ref 3.87–5.11)
RDW: 14.8 % (ref 11.5–15.5)
WBC: 13.2 10*3/uL — AB (ref 4.0–10.5)

## 2016-02-09 LAB — GROUP B STREP BY PCR: GROUP B STREP BY PCR: NEGATIVE

## 2016-02-09 LAB — TYPE AND SCREEN
ABO/RH(D): O POS
Antibody Screen: NEGATIVE

## 2016-02-09 MED ORDER — SENNOSIDES-DOCUSATE SODIUM 8.6-50 MG PO TABS
2.0000 | ORAL_TABLET | ORAL | Status: DC
  Administered 2016-02-09: 2 via ORAL
  Filled 2016-02-09: qty 2

## 2016-02-09 MED ORDER — BENZOCAINE-MENTHOL 20-0.5 % EX AERO
1.0000 "application " | INHALATION_SPRAY | CUTANEOUS | Status: DC | PRN
  Administered 2016-02-09: 1 via TOPICAL
  Filled 2016-02-09: qty 56

## 2016-02-09 MED ORDER — OXYCODONE-ACETAMINOPHEN 5-325 MG PO TABS: 1.0000 | ORAL_TABLET | ORAL | Status: DC | PRN

## 2016-02-09 MED ORDER — ONDANSETRON HCL 4 MG/2ML IJ SOLN: 4.0000 mg | INTRAMUSCULAR | Status: DC | PRN

## 2016-02-09 MED ORDER — ZOLPIDEM TARTRATE 5 MG PO TABS: 5.0000 mg | ORAL_TABLET | Freq: Every evening | ORAL | Status: DC | PRN

## 2016-02-09 MED ORDER — EPHEDRINE 5 MG/ML INJ: 10.0000 mg | INTRAVENOUS | Status: DC | PRN

## 2016-02-09 MED ORDER — DIBUCAINE 1 % RE OINT: 1.0000 "application " | TOPICAL_OINTMENT | RECTAL | Status: DC | PRN

## 2016-02-09 MED ORDER — TETANUS-DIPHTH-ACELL PERTUSSIS 5-2.5-18.5 LF-MCG/0.5 IM SUSP: 0.5000 mL | Freq: Once | INTRAMUSCULAR | Status: DC

## 2016-02-09 MED ORDER — SIMETHICONE 80 MG PO CHEW: 80.0000 mg | CHEWABLE_TABLET | ORAL | Status: DC | PRN

## 2016-02-09 MED ORDER — FENTANYL 2.5 MCG/ML BUPIVACAINE 1/10 % EPIDURAL INFUSION (WH - ANES)
14.0000 mL/h | INTRAMUSCULAR | Status: DC | PRN
  Administered 2016-02-09 (×2): 14 mL/h via EPIDURAL

## 2016-02-09 MED ORDER — ACETAMINOPHEN 325 MG PO TABS: 650.0000 mg | ORAL_TABLET | ORAL | Status: DC | PRN

## 2016-02-09 MED ORDER — OXYCODONE-ACETAMINOPHEN 5-325 MG PO TABS: 2.0000 | ORAL_TABLET | ORAL | Status: DC | PRN

## 2016-02-09 MED ORDER — LACTATED RINGERS IV SOLN: 500.0000 mL | INTRAVENOUS | Status: DC | PRN

## 2016-02-09 MED ORDER — LACTATED RINGERS IV SOLN
INTRAVENOUS | Status: DC
  Administered 2016-02-09: 19:00:00 via INTRAVENOUS

## 2016-02-09 MED ORDER — LACTATED RINGERS IV SOLN
500.0000 mL | Freq: Once | INTRAVENOUS | Status: AC
  Administered 2016-02-09: 500 mL via INTRAVENOUS

## 2016-02-09 MED ORDER — PHENYLEPHRINE 40 MCG/ML (10ML) SYRINGE FOR IV PUSH (FOR BLOOD PRESSURE SUPPORT)
80.0000 ug | PREFILLED_SYRINGE | INTRAVENOUS | Status: DC | PRN
  Filled 2016-02-09: qty 2

## 2016-02-09 MED ORDER — OXYTOCIN BOLUS FROM INFUSION
500.0000 mL | INTRAVENOUS | Status: DC
  Administered 2016-02-09: 500 mL via INTRAVENOUS

## 2016-02-09 MED ORDER — IBUPROFEN 600 MG PO TABS
600.0000 mg | ORAL_TABLET | Freq: Four times a day (QID) | ORAL | Status: DC
Start: 2016-02-09 — End: 2016-02-10
  Administered 2016-02-09 – 2016-02-10 (×3): 600 mg via ORAL
  Filled 2016-02-09 (×3): qty 1

## 2016-02-09 MED ORDER — LIDOCAINE HCL (PF) 1 % IJ SOLN
30.0000 mL | INTRAMUSCULAR | Status: DC | PRN
  Filled 2016-02-09: qty 30

## 2016-02-09 MED ORDER — PHENYLEPHRINE 40 MCG/ML (10ML) SYRINGE FOR IV PUSH (FOR BLOOD PRESSURE SUPPORT)
PREFILLED_SYRINGE | INTRAVENOUS | Status: DC
Start: 2016-02-09 — End: 2016-02-09
  Filled 2016-02-09: qty 20

## 2016-02-09 MED ORDER — LIDOCAINE HCL (PF) 1 % IJ SOLN
INTRAMUSCULAR | Status: DC | PRN
  Administered 2016-02-09 (×2): 8 mL via EPIDURAL

## 2016-02-09 MED ORDER — ONDANSETRON HCL 4 MG/2ML IJ SOLN: 4.0000 mg | Freq: Four times a day (QID) | INTRAMUSCULAR | Status: DC | PRN

## 2016-02-09 MED ORDER — LANOLIN HYDROUS EX OINT: TOPICAL_OINTMENT | CUTANEOUS | Status: DC | PRN

## 2016-02-09 MED ORDER — DIPHENHYDRAMINE HCL 25 MG PO CAPS: 25.0000 mg | ORAL_CAPSULE | Freq: Four times a day (QID) | ORAL | Status: DC | PRN

## 2016-02-09 MED ORDER — PRENATAL MULTIVITAMIN CH
1.0000 | ORAL_TABLET | Freq: Every day | ORAL | Status: DC
  Administered 2016-02-10: 1 via ORAL
  Filled 2016-02-09: qty 1

## 2016-02-09 MED ORDER — WITCH HAZEL-GLYCERIN EX PADS: 1.0000 "application " | MEDICATED_PAD | CUTANEOUS | Status: DC | PRN

## 2016-02-09 MED ORDER — EPHEDRINE 5 MG/ML INJ
10.0000 mg | INTRAVENOUS | Status: DC | PRN
  Filled 2016-02-09: qty 2

## 2016-02-09 MED ORDER — PHENYLEPHRINE 40 MCG/ML (10ML) SYRINGE FOR IV PUSH (FOR BLOOD PRESSURE SUPPORT)
80.0000 ug | PREFILLED_SYRINGE | INTRAVENOUS | Status: DC | PRN
Start: 2016-02-09 — End: 2016-02-09

## 2016-02-09 MED ORDER — CITRIC ACID-SODIUM CITRATE 334-500 MG/5ML PO SOLN: 30.0000 mL | ORAL | Status: DC | PRN

## 2016-02-09 MED ORDER — DIPHENHYDRAMINE HCL 50 MG/ML IJ SOLN: 12.5000 mg | INTRAMUSCULAR | Status: DC | PRN

## 2016-02-09 MED ORDER — FENTANYL 2.5 MCG/ML BUPIVACAINE 1/10 % EPIDURAL INFUSION (WH - ANES)
INTRAMUSCULAR | Status: AC
  Administered 2016-02-09: 14 mL/h via EPIDURAL
  Filled 2016-02-09: qty 125

## 2016-02-09 MED ORDER — LACTATED RINGERS IV SOLN
2.5000 [IU]/h | INTRAVENOUS | Status: DC
  Filled 2016-02-09: qty 4

## 2016-02-09 MED ORDER — ONDANSETRON HCL 4 MG PO TABS: 4.0000 mg | ORAL_TABLET | ORAL | Status: DC | PRN

## 2016-02-09 NOTE — Progress Notes (Signed)
Interpreter at bedside to translate admission questions, policies & procedures on BS and epidural procedure.  Interpreter at bedside from 1920-2005.  Patient verbalizes understanding and all questions answered.

## 2016-02-09 NOTE — Anesthesia Procedure Notes (Signed)
Epidural Patient location during procedure: OB Start time: 02/09/2016 7:37 PM End time: 02/09/2016 7:41 PM  Staffing Anesthesiologist: Leilani Able Performed by: anesthesiologist   Preanesthetic Checklist Completed: patient identified, surgical consent, pre-op evaluation, timeout performed, IV checked, risks and benefits discussed and monitors and equipment checked  Epidural Patient position: sitting Prep: site prepped and draped and DuraPrep Patient monitoring: continuous pulse ox and blood pressure Approach: midline Injection technique: LOR air  Needle:  Needle type: Tuohy  Needle gauge: 17 G Needle length: 9 cm and 9 Needle insertion depth: 5 cm cm Catheter type: closed end flexible Catheter size: 19 Gauge Catheter at skin depth: 9 cm Test dose: negative and Other  Assessment Sensory level: T9 Events: blood not aspirated, injection not painful, no injection resistance, negative IV test and no paresthesia  Additional Notes Reason for block:procedure for pain

## 2016-02-09 NOTE — H&P (Signed)
Kristen Frank is a 30 y.o. female presenting for Active Labor. Maternal Medical History:  Reason for admission: Contractions.  Nausea.  Contractions: Onset was 1-2 hours ago.    Fetal activity: Perceived fetal activity is normal.   Last perceived fetal movement was within the past hour.    Prenatal complications: No bleeding, HIV, oligohydramnios, placental abnormality, pre-eclampsia or preterm labor.   Prenatal Complications - Diabetes: none.    OB History    Gravida Para Term Preterm AB TAB SAB Ectopic Multiple Living   0 2     Past Medical History  Diagnosis Date  . No pertinent past medical history   . HSV-2 infection   . Medical history non-contributory    No past surgical history on file. Family History: family history includes Asthma in her brother. Social History:  reports that she has never smoked. She has never used smokeless tobacco. She reports that she does not drink alcohol or use illicit drugs.   Prenatal Transfer Tool  Maternal Diabetes: No Genetic Screening: Declined Maternal Ultrasounds/Referrals: Normal Fetal Ultrasounds or other Referrals:  None Maternal Substance Abuse:  No Significant Maternal Medications:  None Significant Maternal Lab Results:  Lab values include: Other:  Other Comments:  GBS unknown  Review of Systems  Constitutional: Negative for fever, chills and malaise/fatigue.  Gastrointestinal: Positive for abdominal pain. Negative for nausea, vomiting, diarrhea and constipation.  Genitourinary: Negative for dysuria.  Musculoskeletal: Positive for back pain.  Neurological: Negative for headaches.    Dilation: 7 Effacement (%): 100 Station: -2 Exam by:: ginger morris rn Blood pressure 145/92, pulse 113, temperature 98.4 F (36.9 C), resp. rate 18, last menstrual period 05/19/2015, SpO2 98 %, unknown if currently breastfeeding. Maternal Exam:  Uterine Assessment: Contraction strength is firm.  Contraction frequency is  regular.   Abdomen: Fundal height is 38.   Estimated fetal weight is 7.5.   Fetal presentation: vertex  Introitus: Normal vulva. Normal vagina.  Ferning test: not done.  Nitrazine test: not done. Amniotic fluid character: not assessed.  Pelvis: adequate for delivery.   Cervix: Cervix evaluated by digital exam.     Fetal Exam Fetal Monitor Review: Mode: ultrasound.   Baseline rate: 140.  Variability: moderate (6-25 bpm).   Pattern: accelerations present and variable decelerations.    Fetal State Assessment: Category I - tracings are normal.     Physical Exam  Constitutional: She is oriented to person, place, and time. She appears well-developed and well-nourished. No distress (except for with contractions).  HENT:  Head: Normocephalic.  Cardiovascular: Normal rate, regular rhythm and normal heart sounds.  Exam reveals no gallop and no friction rub.   No murmur heard. Respiratory: Effort normal and breath sounds normal. No respiratory distress. She has no wheezes. She has no rales. She exhibits no tenderness.  GI: Soft. She exhibits no distension. There is no tenderness. There is no rebound and no guarding.  EFW 7.5 lbs  Genitourinary: Vagina normal.  Dilation: 7 Effacement (%): 100 Station: -2 Presentation: Vertex Exam by:: ginger morris rn   Musculoskeletal: Normal range of motion.  Neurological: She is alert and oriented to person, place, and time.  Skin: Skin is warm and dry.  Psychiatric: She has a normal mood and affect.    Prenatal labs: ABO, Rh: --/--/O POS (03/09 0430) Antibody: NEG (03/09 0430) Rubella:   RPR: Non Reactive (03/09 0430)  HBsAg:    HIV:    GBS: Negative (02/19  0000)   Assessment/Plan: SIUP at [redacted]w[redacted]d  Active Labor Unknown GBS  Admit to [facility] Routine orders Epidural Anticipate SVD   [facility] 02/09/2016, 7:10 PM

## 2016-02-09 NOTE — MAU Note (Signed)
Pt presents to MAU with complaints of contractions that started this morning. Denies any vaginal bleeding or LOF 

## 2016-02-09 NOTE — Progress Notes (Signed)
Interpreter called to assist with translation during pushing and delivery.  Interpreter stayed throughout delivery and initial recovery.

## 2016-02-09 NOTE — Progress Notes (Signed)
I assisted Dr Lilli Light with some interpretation during the Epidural procedure, also I was present during the delivery with [facility] Resident , by Orlan Leavens Spanish Interpreter

## 2016-02-09 NOTE — Progress Notes (Signed)
Report given to [facility] charge RN.  Patient to be admitted to room 167.

## 2016-02-09 NOTE — Anesthesia Preprocedure Evaluation (Signed)
Anesthesia Evaluation  Patient identified by MRN, date of birth, ID band Patient awake    Reviewed: Allergy & Precautions, H&P , NPO status , Patient's Chart, lab work & pertinent test results  Airway Mallampati: I  TM Distance: >3 FB Neck ROM: full    Dental no notable dental hx.    Pulmonary neg pulmonary ROS,    Pulmonary exam normal        Cardiovascular negative cardio ROS Normal cardiovascular exam     Neuro/Psych negative neurological ROS  negative psych ROS   GI/Hepatic negative GI ROS, Neg liver ROS,   Endo/Other  negative endocrine ROS  Renal/GU negative Renal ROS     Musculoskeletal   Abdominal (+) + obese,   Peds  Hematology negative hematology ROS (+)   Anesthesia Other Findings   Reproductive/Obstetrics (+) Pregnancy                             Anesthesia Physical Anesthesia Plan  ASA: II  Anesthesia Plan: Epidural   Post-op Pain Management:    Induction:   Airway Management Planned:   Additional Equipment:   Intra-op Plan:   Post-operative Plan:   Informed Consent: I have reviewed the patients History and Physical, chart, labs and discussed the procedure including the risks, benefits and alternatives for the proposed anesthesia with the patient or authorized representative who has indicated his/her understanding and acceptance.     Plan Discussed with:   Anesthesia Plan Comments:         Anesthesia Quick Evaluation  

## 2016-02-10 ENCOUNTER — Ambulatory Visit: Payer: Self-pay

## 2016-02-10 LAB — HIV ANTIBODY (ROUTINE TESTING W REFLEX): HIV Screen 4th Generation wRfx: NONREACTIVE

## 2016-02-10 MED ORDER — IBUPROFEN 600 MG PO TABS: 600.0000 mg | ORAL_TABLET | Freq: Four times a day (QID) | ORAL | Status: DC | PRN

## 2016-02-10 NOTE — Progress Notes (Signed)
Checked on patients needs at 7pm  Also ordered patients breakfast.  Spanish Interperetr

## 2016-02-10 NOTE — Anesthesia Postprocedure Evaluation (Addendum)
Anesthesia Post Note  Patient: Kristen Frank  Procedure(s) Performed: * No procedures listed *  Patient location during evaluation: Mother Baby Anesthesia Type: Epidural Level of consciousness: awake and alert, oriented and patient cooperative Pain management: pain level controlled Vital Signs Assessment: post-procedure vital signs reviewed and stable Respiratory status: spontaneous breathing Cardiovascular status: stable Postop Assessment: no headache, epidural receding, patient able to bend at knees and no signs of nausea or vomiting Anesthetic complications: no Comments: Pt states in minimal pain at time of interview.    Last Vitals:  Filed Vitals:   02/10/16 0334 02/10/16 0621  BP: 105/57 111/79  Pulse: 68 69  Temp: 36.8 C 36.8 C  Resp: 18 18    Last Pain:  Filed Vitals:   02/10/16 0622  PainSc: 5                  Ravenne Wayment

## 2016-02-10 NOTE — Discharge Instructions (Signed)

## 2016-02-10 NOTE — Lactation Note (Signed)
This note was copied from a baby's chart. Lactation Consultation Note  Patient Name: Kristen Frank [id number] Date: 02/10/2016 Reason for consult: Initial assessment  Initial consult at 20 hrs old; GA 38.0; BW 7 lbs, 4.1 oz.  Mom is a P3.  Mom speaks Spanish.  In-house interpreter used to speak to mom. Mom Hx of HSV on Valtrex (L1) prophylaxis. Infant has breastfed x7 (12-35 min) since birth 20 hrs ago; voids-3; stools-2 since birth.  LS-8 by RN. After RN assessment infant awoke and showing cues to feed.   Mom c/o pain on right breast with latching.  Mom stated it was intermittent and thought pain was from infant not latching correctly. Mom attempted to latch to right breast using cradle hold and shallow latch. LC taught mom how to use cross-cradle with asymmetrical latching technique and chin tug with bottom finger for deeper latch.   Mom stated latch using cross-cradle felt better and denied any pain.  LC encouraged need for wide mouth and flanged lips for proper milk transfer.  LS-8.   Infant was still on breast when LC left room.   Lactation brochure given in Spanish. Encouraged mom to call RN for assistance with BF as needed.     Maternal Data Does the patient have breastfeeding experience prior to this delivery?: Yes  Feeding Feeding Type: Breast Fed  LATCH Score/Interventions Latch: Grasps breast easily, tongue down, lips flanged, rhythmical sucking.  Audible Swallowing: A few with stimulation Intervention(s): Skin to skin  Type of Nipple: Everted at rest and after stimulation  Comfort (Breast/Nipple): Soft / non-tender     Hold (Positioning): Assistance needed to correctly position infant at breast and maintain latch. Intervention(s): Breastfeeding basics reviewed;Support Pillows;Skin to skin  LATCH Score: 8  Lactation Tools Discussed/Used     Consult Status Consult Status: Follow-up Date: 02/11/16 Follow-up type: In-patient    Lendon Ka 02/10/2016, 5:24 PM

## 2016-02-10 NOTE — Discharge Summary (Signed)
OB Discharge Summary     Patient Name: Kristen Frank DOB: 04-11-1986 MRN: [id number]  Date of admission: 02/09/2016 Delivering MD: Wynne Dust   Date of discharge: 02/10/2016  Admitting diagnosis: Active labor at term Intrauterine pregnancy: [redacted]w[redacted]d     Secondary diagnosis:  Active Problems:   Active labor at term  Additional problems: HSV2 on Valtrex, late to prenatal care, short interpregnancy interval     Discharge diagnosis: Term Pregnancy Delivered                                                                                                Post partum procedures:None  Augmentation: None  Complications: None  Hospital course:  Onset of Labor With Vaginal Delivery     30 y.o. yo G3P3003 at [redacted]w[redacted]d was admitted in Active Labor on 02/09/2016. Patient had an uncomplicated labor course as follows:  Membrane Rupture Time/Date: 8:39 PM ,02/09/2016   Intrapartum Procedures: Episiotomy: None [1]                                         Lacerations:  None [1]  Patient had a delivery of a Viable infant. 02/09/2016  Information for the patient's newborn:  Madina, Galati [[id number]]  Delivery Method: Vaginal, Spontaneous Delivery (Filed from Delivery Summary)    Pateint had an uncomplicated postpartum course.  She is ambulating, tolerating a regular diet, passing flatus, and urinating well. Patient is discharged home in stable condition on 02/10/2016.    Physical exam  Filed Vitals:   02/09/16 2226 02/09/16 2329 02/10/16 0334 02/10/16 0621  BP: 120/62 112/70 105/57 111/79  Pulse: 75 73 68 69  Temp: 98.9 F (37.2 C) 98.4 F (36.9 C) 98.2 F (36.8 C) 98.2 F (36.8 C)  TempSrc: Oral Oral Oral Oral  Resp: Weight:      SpO2: 99%      General: alert, cooperative and no distress Lochia: appropriate Uterine Fundus: firm, below level of umbilicus, mild tenderness Incision: N/A DVT Evaluation: No cords or calf tenderness, 1+ pretibial pitting edema  bilaterally   Labs: Lab Results  Component Value Date   WBC 13.2* 02/09/2016   HGB 14.3 02/09/2016   HCT 41.5 02/09/2016   MCV 88.7 02/09/2016   PLT 229 02/09/2016   CMP Latest Ref Rng 03/08/2015  Glucose 70 - 99 mg/dL 96  BUN 6 - 23 mg/dL 16  Creatinine 9.52 - 8.41 mg/dL 3.24  Sodium 401 - 027 mmol/L 136  Potassium 3.5 - 5.1 mmol/L 4.2  Chloride 96 - 112 mmol/L 104  CO2 19 - 32 mmol/L 24  Calcium 8.4 - 10.5 mg/dL 25.3  Total Protein 6.0 - 8.3 g/dL 6.9  Total Bilirubin 0.3 - 1.2 mg/dL 0.3  Alkaline Phos 39 - 117 U/L 228(H)  AST 0 - 37 U/L 24  ALT 0 - 35 U/L 19    Discharge instruction: per After Visit Summary and "Baby and Me Booklet".  After visit meds:  Medication List    STOP taking these medications        valACYclovir 500 MG tablet  Commonly known as:  VALTREX      TAKE these medications        ibuprofen 600 MG tablet  Commonly known as:  ADVIL,MOTRIN  Take 1 tablet (600 mg total) by mouth every 6 (six) hours as needed.     prenatal multivitamin Tabs tablet  Take 1 tablet by mouth daily at 12 noon.        Diet: routine diet  Activity: Advance as tolerated. Pelvic rest for 6 weeks.   Outpatient follow up:2 weeks Follow up Appt:No future appointments. Follow up Visit:No Follow-up on file.  Postpartum contraception: Tubal Ligation, message sent to clinic for scheduling (papers signed 01/19/16) Newborn Data: Live born female  Birth Weight: 7 lb 4.1 oz (3291 g) APGAR: 9, 9  Baby Feeding: Breast Disposition:home with mother   02/10/2016 Cam Hai, CNM  9:14 AM

## 2016-02-10 NOTE — Progress Notes (Signed)
POSTPARTUM PROGRESS NOTE  Post Partum Day 1 Subjective:  Kristen Frank is a 30 y.o. 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  No acute events overnight.  Pt denies problems with ambulating, voiding or po intake.  She denies nausea or vomiting.  Pain is well controlled.  She has not had flatus. She has not had bowel movement.  Lochia Minimal.   Objective: Blood pressure 111/79, pulse 69, temperature 98.2 F (36.8 C), temperature source Oral, resp. rate 18, weight 74.844 kg (165 lb), last menstrual period 05/19/2015, SpO2 99 %, unknown if currently breastfeeding.  Physical Exam:  General: alert, cooperative and no distress, reclining in bed with baby on chest Chest: CTAB Heart: RRR no m/r/g Abdomen: +BS, soft, nontender  Uterine Fundus: firm, below umbilicus DVT Evaluation: No calf swelling or tenderness Extremities: 1+ pitting edema bilaterally, 2+ dorsalis pedis   Recent Labs  02/09/16 1910  HGB 14.3  HCT 41.5    Assessment/Plan:  ASSESSMENT: Kristen Frank is a 30 y.o. G3P3003 [redacted]w[redacted]d s/p SVD after SOL and GBS(-) by PCR.  Requests d/c this evening if possible.  Breastfeeding Contraception - Interval BTL with papers signed Rubella and varicella immune per media   LOS: 1 day   Ann Maki 02/10/2016, 7:51 AM   I have seen and examined this patient and I agree with the above. See Discharge Summary. Cam Hai 9:27 AM 02/10/2016

## 2016-02-10 NOTE — Progress Notes (Signed)
Checked on patients needs.  °Spanish Interpreter  °

## 2016-02-10 NOTE — Progress Notes (Signed)
Checked on patients needs.  Assisted Lactation with breastfeeding instructions. Also assisted RN with discharge instructions.  Spanish Interpreter

## 2016-02-10 NOTE — Anesthesia Postprocedure Evaluation (Signed)
Anesthesia Post Note  Patient: Kristen Frank  Procedure(s) Performed: * No procedures listed *  Patient location during evaluation: Mother Baby Anesthesia Type: Epidural Level of consciousness: awake and alert Pain management: satisfactory to patient Vital Signs Assessment: post-procedure vital signs reviewed and stable Respiratory status: respiratory function stable Cardiovascular status: stable Postop Assessment: no headache, no backache, epidural receding, patient able to bend at knees, no signs of nausea or vomiting and adequate PO intake Anesthetic complications: no Comments: Comfort level was assessed by AnesthesiaTeam and the patient was pleased with the care, interventions, and services provided by the Department of Anesthesia.    Last Vitals:  Filed Vitals:   02/10/16 0334 02/10/16 0621  BP: 105/57 111/79  Pulse: 68 69  Temp: 36.8 C 36.8 C  Resp: 18 18    Last Pain:  Filed Vitals:   02/10/16 0859  PainSc: 2                  Pansie Guggisberg

## 2016-02-10 NOTE — Clinical Social Work Maternal (Signed)
  CLINICAL SOCIAL WORK MATERNAL/CHILD NOTE  Patient Details  Name: Cinzia Devos MRN: [id number] Date of Birth: 24-Sep-1986  Date:  02/10/2016  Clinical Social Worker Initiating Note:  Norlene Duel, LCSW Date/ Time Initiated:  02/10/16/1030     Child's Name:  Tenna Delaine   Legal Guardian:   (Parents Carma Lair and Jacklynn Barnacle)   Need for Interpreter:  [place] [employer]illers facilitated the interview)   Date of Referral:  02/09/16     Reason for Referral:  Other (Comment)   Referral Source:  [facility]   Address:  [address]  [place], [place] [address]  Phone number:   [phone])   Household Members:  Self, Significant Other, Relatives, Minor Children   Natural Supports (not living in the home):  Extended Family   Professional Supports: None   Employment:  (Maternal grandmother is employed and supporting the family)   Type of Work:     Education:      Pensions consultant:  [employer]   Other Resources:  Physicist, medical , [place] Considerations Which May Impact Care:  none noted  Strengths:  Ability to meet basic needs , Home prepared for child    Risk Factors/Current Problems:  None   Cognitive State:  Able to Concentrate , Alert    Mood/Affect:  Happy    CSW Assessment:  Acknowledged order for social work consult.   Informed that mother had late prenatal care and father was deported June 2016.  Met with mother who was pleasant and receptive to social work.    Mother has 2 other dependents ages 42 and 43 months.    Informed that [facility] [place] at 16 weeks.   MOB reports that FOB was deported, but has returned since.  They are residing together with maternal grandmother and the 2 children.  FOB is currently unemployed but looking for work.  Mother denies any hx of depression other than when FOB was deported.  Informed that the symptoms were not severe and she did not seek treatment.     She denies any  current symptoms of depression or anxiety.    She denies any use of alcohol or illicit drug use during pregnancy.   No acute social concerns noted or reported at this time.   Mother informed of social work Fish farm manager.  CSW Plan/Description:     Discussed signs/symptoms of PP Depression and available resources No further intervention required No barriers to discharge   Deniz Eskridge J, LCSW 02/10/2016, 12:59 PM

## 2016-02-11 ENCOUNTER — Ambulatory Visit: Payer: Self-pay

## 2016-02-11 LAB — RPR: RPR Ser Ql: NONREACTIVE

## 2016-02-11 NOTE — Lactation Note (Addendum)
This note was copied from a baby's chart. Lactation Consultation Note  Patient Name: Boy Keymoni Mccaster [id number] Date: 02/11/2016 Reason for consult: Follow-up assessment  Visited with Mom on day of discharge, infant 71 hrs old.  Using spanish interpretor, basic teaching done.  Mom denies any difficulty with latch.  Baby has fed 8 times in last 24 hrs, with latch score of 8-9.  Output good.  Assisted with latch, and Mom used the cross cradle hold.  Baby latched on a little shallow, so showed how to unlatch baby without causing trauma to nipple.  Situated pillows better and baby latched on deeper the 2nd attempt.  Baby not very vigorous at this feeding, but teaching done on what to look for (jaw extensions, and swallowing, and tugging).  Encouraged skin to skin, and cue based feedings.  Manual breast pump given with instructions on cleaning and use.  Engorgement prevention and treatment discussed. Reminded Mom of OP lactation services available to her.  Follow up with Pediatrician tomorrow.  To call prn.  Consult Status Consult Status: Complete Date: 02/11/16    Judee Clara 02/11/2016, 12:46 PM

## 2016-02-28 ENCOUNTER — Encounter: Payer: Medicaid Other | Admitting: Family Medicine

## 2016-06-17 ENCOUNTER — Encounter (HOSPITAL_COMMUNITY): Payer: Self-pay | Admitting: *Deleted

## 2016-06-17 ENCOUNTER — Emergency Department (HOSPITAL_COMMUNITY)
Admission: EM | Admit: 2016-06-17 | Discharge: 2016-06-17 | Disposition: A | Discharge status: Home / Self Care | Payer: Medicaid Other | Attending: Emergency Medicine | Admitting: Emergency Medicine

## 2016-06-17 ENCOUNTER — Emergency Department (HOSPITAL_COMMUNITY): Payer: Medicaid Other

## 2016-06-17 DIAGNOSIS — W19XXXA Unspecified fall, initial encounter: Secondary | ICD-10-CM

## 2016-06-17 DIAGNOSIS — W1839XA Other fall on same level, initial encounter: Secondary | ICD-10-CM | POA: Insufficient documentation

## 2016-06-17 DIAGNOSIS — Y9389 Activity, other specified: Secondary | ICD-10-CM | POA: Insufficient documentation

## 2016-06-17 DIAGNOSIS — Y999 Unspecified external cause status: Secondary | ICD-10-CM | POA: Insufficient documentation

## 2016-06-17 DIAGNOSIS — H6121 Impacted cerumen, right ear: Secondary | ICD-10-CM | POA: Insufficient documentation

## 2016-06-17 DIAGNOSIS — Y929 Unspecified place or not applicable: Secondary | ICD-10-CM | POA: Insufficient documentation

## 2016-06-17 DIAGNOSIS — M25562 Pain in left knee: Secondary | ICD-10-CM | POA: Insufficient documentation

## 2016-06-17 MED ORDER — IBUPROFEN 600 MG PO TABS: 600.0000 mg | ORAL_TABLET | Freq: Four times a day (QID) | ORAL | Status: DC | PRN

## 2016-06-17 MED ORDER — TETANUS-DIPHTH-ACELL PERTUSSIS 5-2.5-18.5 LF-MCG/0.5 IM SUSP
0.5000 mL | Freq: Once | INTRAMUSCULAR | Status: AC
  Administered 2016-06-17: 0.5 mL via INTRAMUSCULAR
  Filled 2016-06-17: qty 0.5

## 2016-06-17 MED ORDER — HYDROCODONE-ACETAMINOPHEN 5-325 MG PO TABS
1.0000 | ORAL_TABLET | Freq: Once | ORAL | Status: AC
  Administered 2016-06-17: 1 via ORAL
  Filled 2016-06-17: qty 1

## 2016-06-17 MED ORDER — HYDROCODONE-ACETAMINOPHEN 5-325 MG PO TABS: 1.0000 | ORAL_TABLET | Freq: Four times a day (QID) | ORAL | Status: DC | PRN
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
## 2016-06-17 NOTE — ED Notes (Signed)
Pt to xray
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
## 2016-06-17 NOTE — Discharge Instructions (Signed)
Tapón de cerumen  (Cerumen Impaction)  Las estructuras del canal auditivo externo secretan una sustancia cerosa llamada cerumen. El exceso de cerumen puede acumularse en el canal auditivo y causar una afección conocida como tapón de cerumen. El tapón de cerumen puede producir dolor de oído y alterar el funcionamiento de este órgano.  La velocidad de producción del cerumen es diferente en cada persona. En algunas, la estructura del canal auditivo puede reducir su capacidad de eliminar el cerumen de forma natural.  CAUSAS  La causa del tapón de cerumen es el exceso de producción o la acumulación de esta secreción.  FACTORES DE RIESGO  · Usar hisopos frecuentemente para limpiarse los oídos.  · Tener los canales auditivos estrechos.  · Tener eccema.  · Estar deshidratado.  SIGNOS Y SÍNTOMAS  · Disminución de la audición.  · Secreción del oído.  · Dolor de oído.  · Picazón en el oído.  TRATAMIENTO  El tratamiento puede incluir lo siguiente:  · Gotas óticas de venta libre o recetadas para ablandar el cerumen.  · Extracción del cerumen a cargo de un médico. Esto se puede hacer de las siguientes maneras:    Lavado con agua tibia. Este es el método de extracción más común.    Cucharillas de raspado y otros instrumentos para el oído.    Cirugía. Esta puede realizarse cuando los casos son graves.  INSTRUCCIONES PARA EL CUIDADO EN EL HOGAR  · Tome los medicamentos solamente como se lo haya indicado el médico.  · No se introduzca objetos en el oído con la intención de limpiarlo.  PREVENCIÓN  · No se introduzca objetos en el oído, aunque sea con la intención de limpiarlo. No es necesario quitar el cerumen como parte de la higiene normal, y no se recomienda el uso de hisopos en el canal auditivo.  · Beba suficiente agua para mantener la orina clara o de color amarillo pálido.  · Controle el eccema, si lo tiene.  SOLICITE ATENCIÓN MÉDICA SI:  · Tiene dolor de oído.  · Le sangra el oído.  · El cerumen no se elimina después de  colocarse gotas óticas como se lo indicaron.     Esta información no tiene como fin reemplazar el consejo del médico. Asegúrese de hacerle al médico cualquier pregunta que tenga.     Document Released: 12/16/2005 Document Revised: 01/06/2015  Elsevier Interactive Patient Education ©2016 [employer]

## 2017-05-11 ENCOUNTER — Emergency Department (HOSPITAL_COMMUNITY)
Admission: EM | Admit: 2017-05-11 | Discharge: 2017-05-12 | Disposition: A | Discharge status: Home / Self Care | Payer: Medicaid Other | Attending: Emergency Medicine | Admitting: Emergency Medicine

## 2017-05-11 ENCOUNTER — Encounter (HOSPITAL_COMMUNITY): Payer: Self-pay

## 2017-05-11 DIAGNOSIS — R21 Rash and other nonspecific skin eruption: Secondary | ICD-10-CM | POA: Diagnosis present

## 2017-05-11 DIAGNOSIS — L237 Allergic contact dermatitis due to plants, except food: Secondary | ICD-10-CM | POA: Insufficient documentation

## 2017-05-11 MED ORDER — PREDNISONE 10 MG PO TABS: 20.0000 mg | ORAL_TABLET | Freq: Two times a day (BID) | ORAL | 0 refills | Status: DC

## 2017-05-11 MED ORDER — CEPHALEXIN 500 MG PO CAPS: 500.0000 mg | ORAL_CAPSULE | Freq: Four times a day (QID) | ORAL | 0 refills | Status: DC

## 2017-05-11 MED ORDER — HYDROXYZINE HCL 25 MG PO TABS: 25.0000 mg | ORAL_TABLET | Freq: Three times a day (TID) | ORAL | 0 refills | Status: DC | PRN

## 2017-05-11 NOTE — ED Triage Notes (Signed)
Onset 2 days ago pt was exposed (has h/o same) to poison oak or ivy, red raised rash that has yellow crusts on right arm, itching. Has not taken any medications or creams.

## 2017-05-11 NOTE — Discharge Instructions (Signed)
The medication for itching can make you sleepy. Do not take if driving or breast feeding.

## 2017-05-11 NOTE — ED Provider Notes (Signed)
[facility]-EMERGENCY DEPT Provider Note   CSN: [id number] Arrival date & time: 05/11/17  2021  By signing my name below, I, Modena Jansky, attest that this documentation has been prepared under the direction and in the presence of non-physician practitioner, Kerrie Buffalo, NP. Electronically Signed: Modena Jansky, Scribe. 05/11/2017. 11:40 PM.  History   Chief Complaint Chief Complaint  Patient presents with  . Rash   The history is provided by the patient. A language interpreter was used.  Rash   This is a new problem. The current episode started 2 days ago. The problem has been gradually worsening. The problem is associated with plant contact. There has been no fever. The rash is present on the right arm and neck. Associated symptoms include weeping. She has tried nothing for the symptoms.   HPI Comments: Kristen Frank is a 31 y.o. female who presents to the Emergency Department complaining of constant moderate RUE/neck rash that started 2 days ago. She suspects she was exposed to poison ivy. She took an unspecified pill from her husband without any relief. She describes the rash as red, raised and with yellow discharge. She reports associated chills and headache. She admits to a hx of similar complaint. Denies any fever, sore throat, throat closing, SOB, or other complaints at this time.  Past Medical History:  Diagnosis Date  . HSV-2 infection   . Medical history non-contributory   . No pertinent past medical history     Patient Active Problem List   Diagnosis Date Noted  . Active labor at term 02/09/2016  . Labor and delivery, indication for care 03/08/2015  . Evaluate anatomy not seen on prior sonogram   . [redacted] weeks gestation of pregnancy   . Preeclampsia 04/20/2012    No past surgical history on file.  OB History    Gravida Para Term Preterm AB Living   3 3 3     3    SAB TAB Ectopic Multiple Live Births         0 3       Home Medications    Prior to Admission  medications   Medication Sig Start Date End Date Taking? Authorizing Provider  HYDROcodone-acetaminophen (NORCO/VICODIN) 5-325 MG tablet Take 1 tablet by mouth every 6 (six) hours as needed for moderate pain or severe pain. 06/17/16   Earley Favor, NP  ibuprofen (ADVIL,MOTRIN) 600 MG tablet Take 1 tablet (600 mg total) by mouth every 6 (six) hours as needed for moderate pain. 06/17/16   Earley Favor, NP    Family History Family History  Problem Relation Age of Onset  . Asthma Brother     Social History Social History  Substance Use Topics  . Smoking status: Never Smoker  . Smokeless tobacco: Never Used  . Alcohol use No     Allergies   Patient has no known allergies.   Review of Systems Review of Systems  Constitutional: Positive for chills. Negative for fever.  HENT: Negative for sore throat and trouble swallowing.   Eyes: Negative for itching.  Respiratory: Negative for shortness of breath.   Musculoskeletal: Negative for joint swelling.  Skin: Positive for rash (RUE, neck).  Psychiatric/Behavioral: The patient is not nervous/anxious.      Physical Exam Updated Vital Signs BP 105/66   Pulse 83   Temp 98.9 F (37.2 C) (Oral)   Resp 16   SpO2 100%   Physical Exam  Constitutional: She appears well-developed and well-nourished. No distress.  HENT:  Head: Normocephalic.  Eyes: Conjunctivae are normal.  Neck: Neck supple.  Cardiovascular: Normal rate.   Pulmonary/Chest: Effort normal.  Musculoskeletal: Normal range of motion.  See skin exam  Neurological: She is alert.  Skin: Skin is warm and dry.  Rash to the palmar aspect of the right elbow and forearm consistent with contact dermatitis. Erythema and possible early infection noted. No red streaking.   Psychiatric: She has a normal mood and affect.  Nursing note and vitals reviewed.    ED Treatments / Results  DIAGNOSTIC STUDIES: Oxygen Saturation is 100% on RA, normal by my interpretation.     COORDINATION OF CARE: 11:44 PM- Pt advised of plan for treatment and pt agrees.  Labs (all labs ordered are listed, but only abnormal results are displayed) Labs Reviewed - No data to display  Radiology No results found.  Procedures Procedures (including critical care time)  Medications Ordered in ED Medications - No data to display   Initial Impression / Assessment and Plan / ED Course  I have reviewed the triage vital signs and the nursing notes. Patient with contact dermatitis. Instructed to avoid offending agent and to use unscented soaps, lotions, and detergents. Will treat with short course of steroids and atarax for itching.  Possible early secondary infection will treat with keflex. Follow up with PCP or return here for worsening symptoms. Return precautions discussed. Pt is safe for discharge at this time.  Final Clinical Impressions(s) / ED Diagnoses   Final diagnoses:  Allergic contact dermatitis due to plants, except food    New Prescriptions New Prescriptions   No medications on file  I personally performed the services described in this documentation, which was scribed in my presence. The recorded information has been reviewed and is accurate.     Kerrie Buffaloeese, Malva [place]ing [place], [place] 05/12/17 [id number]    Lorre NickAllen, Anthony, MD 05/14/17 [phone]

## 2017-10-17 ENCOUNTER — Ambulatory Visit: Payer: Self-pay | Admitting: Family Medicine

## 2019-01-18 ENCOUNTER — Encounter (HOSPITAL_COMMUNITY): Payer: Self-pay | Admitting: *Deleted

## 2019-01-18 ENCOUNTER — Other Ambulatory Visit: Payer: Self-pay

## 2019-01-18 ENCOUNTER — Inpatient Hospital Stay (HOSPITAL_COMMUNITY)
Admission: AD | Admit: 2019-01-18 | Discharge: 2019-01-21 | DRG: 885 | Disposition: A | Discharge status: Home / Self Care | Payer: Federal, State, Local not specified - Other | Source: Intra-hospital | Attending: Psychiatry | Admitting: Psychiatry

## 2019-01-18 ENCOUNTER — Inpatient Hospital Stay (HOSPITAL_COMMUNITY): Admit: 2019-01-18 | Payer: Self-pay | Admitting: Psychiatry

## 2019-01-18 ENCOUNTER — Emergency Department (HOSPITAL_COMMUNITY)
Admission: EM | Admit: 2019-01-18 | Discharge: 2019-01-18 | Disposition: A | Discharge status: Other Acute Inpatient Hospital | Payer: Self-pay | Attending: Emergency Medicine | Admitting: Emergency Medicine

## 2019-01-18 DIAGNOSIS — F322 Major depressive disorder, single episode, severe without psychotic features: Secondary | ICD-10-CM | POA: Diagnosis present

## 2019-01-18 DIAGNOSIS — R45851 Suicidal ideations: Secondary | ICD-10-CM | POA: Diagnosis present

## 2019-01-18 DIAGNOSIS — F419 Anxiety disorder, unspecified: Secondary | ICD-10-CM | POA: Diagnosis present

## 2019-01-18 DIAGNOSIS — F332 Major depressive disorder, recurrent severe without psychotic features: Secondary | ICD-10-CM | POA: Insufficient documentation

## 2019-01-18 DIAGNOSIS — G4700 Insomnia, unspecified: Secondary | ICD-10-CM | POA: Diagnosis present

## 2019-01-18 DIAGNOSIS — Z915 Personal history of self-harm: Secondary | ICD-10-CM

## 2019-01-18 DIAGNOSIS — F4310 Post-traumatic stress disorder, unspecified: Secondary | ICD-10-CM | POA: Insufficient documentation

## 2019-01-18 LAB — COMPREHENSIVE METABOLIC PANEL
ALK PHOS: 59 U/L (ref 38–126)
ALT: 14 U/L (ref 0–44)
ANION GAP: 10 (ref 5–15)
AST: 18 U/L (ref 15–41)
Albumin: 3.8 g/dL (ref 3.5–5.0)
BILIRUBIN TOTAL: 0.2 mg/dL — AB (ref 0.3–1.2)
BUN: 11 mg/dL (ref 6–20)
CALCIUM: 9 mg/dL (ref 8.9–10.3)
CO2: 20 mmol/L — ABNORMAL LOW (ref 22–32)
Chloride: 106 mmol/L (ref 98–111)
Creatinine, Ser: 0.69 mg/dL (ref 0.44–1.00)
GFR calc non Af Amer: >60 mL/min (ref >60)
Glucose, Bld: 101 mg/dL — ABNORMAL HIGH (ref 70–99)
POTASSIUM: 4.1 mmol/L (ref 3.5–5.1)
Sodium: 136 mmol/L (ref 135–145)
TOTAL PROTEIN: 7.2 g/dL (ref 6.5–8.1)

## 2019-01-18 LAB — I-STAT BETA HCG BLOOD, ED (MC, WL, AP ONLY): I-stat hCG, quantitative: <5 m[IU]/mL (ref <5)

## 2019-01-18 LAB — CBC
HCT: 46 % (ref 36.0–46.0)
Hemoglobin: 14.9 g/dL (ref 12.0–15.0)
MCH: 29.9 pg (ref 26.0–34.0)
MCHC: 32.4 g/dL (ref 30.0–36.0)
MCV: 92.2 fL (ref 80.0–100.0)
PLATELETS: 336 10*3/uL (ref 150–400)
RBC: 4.99 MIL/uL (ref 3.87–5.11)
RDW: 12.3 % (ref 11.5–15.5)
WBC: 11.1 10*3/uL — ABNORMAL HIGH (ref 4.0–10.5)
nRBC: 0 % (ref 0.0–0.2)

## 2019-01-18 LAB — ETHANOL: Alcohol, Ethyl (B): <10 mg/dL (ref <10)

## 2019-01-18 LAB — ACETAMINOPHEN LEVEL

## 2019-01-18 LAB — SALICYLATE LEVEL

## 2019-01-18 MED ORDER — ALUM & MAG HYDROXIDE-SIMETH 200-200-20 MG/5ML PO SUSP: 30.0000 mL | ORAL | Status: DC | PRN

## 2019-01-18 MED ORDER — TRAZODONE HCL 50 MG PO TABS
50.0000 mg | ORAL_TABLET | Freq: Every evening | ORAL | Status: DC | PRN
  Filled 2019-01-18: qty 1

## 2019-01-18 MED ORDER — HYDROXYZINE HCL 25 MG PO TABS
25.0000 mg | ORAL_TABLET | Freq: Three times a day (TID) | ORAL | Status: DC | PRN
  Filled 2019-01-18: qty 10
  Filled 2019-01-18: qty 1

## 2019-01-18 MED ORDER — ACETAMINOPHEN 325 MG PO TABS: 650.0000 mg | ORAL_TABLET | Freq: Four times a day (QID) | ORAL | Status: DC | PRN

## 2019-01-18 MED ORDER — MAGNESIUM HYDROXIDE 400 MG/5ML PO SUSP: 30.0000 mL | Freq: Every day | ORAL | Status: DC | PRN

## 2019-01-18 NOTE — Progress Notes (Signed)
Patient did not attend wrap up group. 

## 2019-01-18 NOTE — ED Notes (Signed)
Pt wanded by security. 

## 2019-01-18 NOTE — [facility] Group Notes (Signed)
[facility] Group Notes:  (Nursing/MHT/Case Management/Adjunct)  Date:  01/18/2019  Time:  4:00 pm  Type of Therapy:  Psychoeducational Skills  Participation Level:  Did Not Attend  Participation Quality:    Affect:    Cognitive:    Insight:    Engagement in Group:    Modes of Intervention:    Summary of Progress/Problems:  Kristen Frank 01/18/2019, 6:54 PM
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
## 2019-01-18 NOTE — B[facility] Counselor (Signed)
NEEDS TO BE SEEN. Telepsych cart being use for numerous re-assessments. Clock stopped. KG to see upon arrival at 1300.
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
## 2019-01-18 NOTE — Progress Notes (Signed)
Skin check at admission:  Patient has dry skin, feet, legs.  Patient does NOT have any wounds, rashes, open areas, no tattoos and no cut marks.   Patient stated she has NOT fallen in the past 6 months.

## 2019-01-18 NOTE — Progress Notes (Signed)
Pt accepted to [facility]; room 406-1 Elta Guadeloupe, NP is the accepting provider.   Dr. Jola Babinski is the attending provider.   Call report to [phone]   Arlys John @ [facility] ED notified.    Pt is involuntary and will be transported by law enforcement Pt is scheduled to arrive at [facility] at 5pm.   Wells Guiles, LCSW, LCAS Disposition CSW [facility]/TTS [phone]

## 2019-01-18 NOTE — ED Notes (Signed)
All belongings and paperwork given to [facility] transporter.  Patient A&Ox4 and ambulatory. [facility] adult unit given report and know that patient is on the way via [facility] transportation.
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
## 2019-01-18 NOTE — Progress Notes (Signed)
D: Pt sleep in room this evening, minimal interaction tonight.   A: Pt was offered support and encouragement.  Pt was encourage to attend groups. Q 15 minute checks were done for safety.   R: safety maintained on unit.

## 2019-01-18 NOTE — ED Notes (Signed)
TTS being completed at this time with interpretor services

## 2019-01-18 NOTE — Tx Team (Signed)
Initial Treatment Plan 01/18/2019 8:19 PM Raisha Bernardin DXA:[id number]    PATIENT STRESSORS: Marital or family conflict   PATIENT STRENGTHS: Ability for insight Average or above average intelligence Capable of independent living General fund of knowledge Motivation for treatment/growth Physical Health   PATIENT IDENTIFIED PROBLEMS: Depression Suicidal thoughts "Sometimes I feel I want to die" "I feel like my husband is not right, he drinks a lot"                     DISCHARGE CRITERIA:  Ability to meet basic life and health needs Improved stabilization in mood, thinking, and/or behavior Reduction of life-threatening or endangering symptoms to within safe limits Verbal commitment to aftercare and medication compliance  PRELIMINARY DISCHARGE PLAN: Attend aftercare/continuing care group  PATIENT/FAMILY INVOLVEMENT: This treatment plan has been presented to and reviewed with the patient, Candace Fraiser, and/or family member, .  The patient and family have been given the opportunity to ask questions and make suggestions.  Doni Bacha, [place], [place] 01/18/2019, 8:19 PM

## 2019-01-18 NOTE — ED Notes (Signed)
pts mother at bedside, pts mother provided information sheet on [facility] guidelines at [facility]

## 2019-01-19 DIAGNOSIS — F419 Anxiety disorder, unspecified: Secondary | ICD-10-CM

## 2019-01-19 DIAGNOSIS — F322 Major depressive disorder, single episode, severe without psychotic features: Principal | ICD-10-CM

## 2019-01-19 DIAGNOSIS — G4700 Insomnia, unspecified: Secondary | ICD-10-CM

## 2019-01-19 MED ORDER — CITALOPRAM HYDROBROMIDE 10 MG PO TABS
10.0000 mg | ORAL_TABLET | Freq: Every day | ORAL | Status: DC
  Administered 2019-01-19 – 2019-01-21 (×3): 10 mg via ORAL
  Filled 2019-01-19 (×3): qty 1
  Filled 2019-01-19: qty 7
  Filled 2019-01-19 (×2): qty 1
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
## 2019-01-19 NOTE — Progress Notes (Signed)
Adult Psychoeducational Group Note  Date:  01/19/2019 Time:  9:55 PM  Group Topic/Focus:  Wrap-Up Group:   The focus of this group is to help patients review their daily goal of treatment and discuss progress on daily workbooks.  Participation Level:  Active  Participation Quality:  Appropriate  Affect:  Appropriate  Cognitive:  Appropriate  Insight: Appropriate  Engagement in Group:  Engaged  Modes of Intervention:  Discussion  Additional Comments:  Patient attended group and participated.   Kristen Frank W Kristen Frank 01/19/2019, 9:55 PM

## 2019-01-19 NOTE — BHH Group Notes (Signed)
LCSW Group Therapy Note   01/19/2019  2:30 PM   Type of Therapy and Topic:  Group Therapy- Anger: Consequences and Cues   Participation Level:  Invited, did not attend.     Description of Group:   In this group, patients defined and differentiated anger from other emotions and identified when and how anger becomes a problem. Patient's identified triggering events and their personal anger cues, secondary emotions to anger, and identified healthy coping mechanisms.    Therapeutic Goals: 1. Patients will identify and discuss consequences of anger and outbursts. 2. Patients will identify and discuss triggering events for anger. 3. Patients will explore other emotions that tend to fuel their secondary emotion of anger. 4. Patients will recognize their physical, behavioral, emotional, and cognitive anger cues. 5. Patients will identify coping skills for anger triggering emotions or events.    Summary of Patient Progress:   N/A  Therapeutic Modalities:   Cognitive Behavioral Therapy Motivation Interviewing Solution Focused Therapy  
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
## 2019-01-19 NOTE — Plan of Care (Signed)
Progress note  D:pt has been in her room most of the day; compliant with medication administration. Pt denies any si/hi/ah/vh and verbally agrees to approach staff if these become apparent or before harm[facility] or [facility]. Pt has no complaints or pain, rating her pain a 0/10. Pt's interpretor at her side.  A: pt provided support and encouragement. Pt given medication per protocol and standing orders. Q758m safety checks implemented and continued.  R: pt safe on the unit. Will continue to monitor.   Pt progressing in the following metrics  Problem: Education: Goal: Knowledge of [place] [facility] Education information/materials will improve Outcome: Progressing Goal: Emotional status will improve Outcome: Progressing Goal: Mental status will improve Outcome: Progressing Goal: Verbalization of understanding the information provided will improve Outcome: Progressing

## 2019-01-20 LAB — TSH: TSH: 2.658 u[IU]/mL (ref 0.350–4.500)
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
## 2019-01-20 NOTE — Plan of Care (Signed)

## 2019-01-20 NOTE — Therapy (Signed)
Occupational Therapy Group Note  Date:  01/20/2019 Time:  11:30 AM  Group Topic/Focus:  Self Esteem Action Plan:   The focus of this group is to help patients create a plan to continue to build self-esteem after discharge.  Participation Level:  Active  Participation Quality:  Appropriate  Affect:  Blunted  Cognitive:  Appropriate  Insight: Improving  Engagement in Group:  Engaged  Modes of Intervention:  Activity, Discussion, Education and Socialization  Additional Comments:    S: "Loving yourself is a great way to increase self esteem"  O: OT tx with focus on self esteem building this date. Education given on definition of self esteem, with both causes of low and high self esteem identified. Activity given for pt to identify a positive/aspiring trait for each letter of the alphabet. Pt to work with peers to help complete activity and build positive thinking.   A: Pt presents to group with blunted affect, presents with translator, engaged and participatory throughout entirety of session this date. Pt actively engaging in discussion with interpreter. A-Z activity completed in Spanish at 75% compeltion with mod cues from interpreter.  P: Education given on self esteem and how to improve this date. Handouts and activities given to help facilitate skills when reintegrating into community.   Dalphine Handing, MSOT, OTR/L Behavioral Health OT/ Acute Relief OT PHP Office: [phone]  Dalphine Handing 01/20/2019, 11:30 AM

## 2019-01-20 NOTE — Tx Team (Signed)
Interdisciplinary Treatment and Diagnostic Plan Update  01/20/2019 Time of Session:  Kristen Frank MRN: [id number]  Principal Diagnosis: <principal problem not specified>  Secondary Diagnoses: Active Problems:   MDD (major depressive disorder), single episode, severe , no psychosis (HCC)   Current Medications:  Current Facility-Administered Medications  Medication Dose Route Frequency Provider Last Rate Last Dose  . acetaminophen (TYLENOL) tablet 650 mg  650 mg Oral Q6H PRN Laveda Abbe, NP      . alum & mag hydroxide-simeth (MAALOX/MYLANTA) 200-200-20 MG/5ML suspension 30 mL  30 mL Oral Q4H PRN Laveda Abbe, NP      . citalopram (CELEXA) tablet 10 mg  10 mg Oral Daily Cobos, Rockey Situ, MD   10 mg at 01/20/19 0818  . hydrOXYzine (ATARAX/VISTARIL) tablet 25 mg  25 mg Oral TID PRN Laveda Abbe, NP      . magnesium hydroxide (MILK OF MAGNESIA) suspension 30 mL  30 mL Oral Daily PRN Laveda Abbe, NP      . traZODone (DESYREL) tablet 50 mg  50 mg Oral QHS PRN Laveda Abbe, NP       PTA Medications: Medications Prior to Admission  Medication Sig Dispense Refill Last Dose  . cephALEXin (KEFLEX) 500 MG capsule Take 1 capsule (500 mg total) by mouth 4 (four) times daily. 20 capsule 0   . HYDROcodone-acetaminophen (NORCO/VICODIN) 5-325 MG tablet Take 1 tablet by mouth every 6 (six) hours as needed for moderate pain or severe pain. 12 tablet 0   . hydrOXYzine (ATARAX/VISTARIL) 25 MG tablet Take 1 tablet (25 mg total) by mouth every 8 (eight) hours as needed (itching). 15 tablet 0   . ibuprofen (ADVIL,MOTRIN) 600 MG tablet Take 1 tablet (600 mg total) by mouth every 6 (six) hours as needed for moderate pain. 30 tablet 0   . predniSONE (DELTASONE) 10 MG tablet Take 2 tablets (20 mg total) by mouth 2 (two) times daily with a meal. 16 tablet 0     Patient Stressors: Marital or family conflict  Patient Strengths: Ability for insight Average or  above average intelligence Capable of independent living General fund of knowledge Motivation for treatment/growth Physical Health  Treatment Modalities: Medication Management, Group therapy, Case management,  1 to 1 session with clinician, Psychoeducation, Recreational therapy.   Physician Treatment Plan for Primary Diagnosis: <principal problem not specified> Long Term Goal(s): Improvement in symptoms so as ready for discharge Improvement in symptoms so as ready for discharge   Short Term Goals: Ability to identify changes in lifestyle to reduce recurrence of condition will improve Ability to maintain clinical measurements within normal limits will improve Ability to identify changes in lifestyle to reduce recurrence of condition will improve Ability to verbalize feelings will improve Ability to disclose and discuss suicidal ideas Ability to demonstrate self-control will improve Ability to identify and develop effective coping behaviors will improve Ability to maintain clinical measurements within normal limits will improve  Medication Management: Evaluate patient's response, side effects, and tolerance of medication regimen.  Therapeutic Interventions: 1 to 1 sessions, Unit Group sessions and Medication administration.  Evaluation of Outcomes: Progressing  Physician Treatment Plan for Secondary Diagnosis: Active Problems:   MDD (major depressive disorder), single episode, severe , no psychosis (HCC)  Long Term Goal(s): Improvement in symptoms so as ready for discharge Improvement in symptoms so as ready for discharge   Short Term Goals: Ability to identify changes in lifestyle to reduce recurrence of condition will improve Ability to maintain  clinical measurements within normal limits will improve Ability to identify changes in lifestyle to reduce recurrence of condition will improve Ability to verbalize feelings will improve Ability to disclose and discuss suicidal  ideas Ability to demonstrate self-control will improve Ability to identify and develop effective coping behaviors will improve Ability to maintain clinical measurements within normal limits will improve     Medication Management: Evaluate patient's response, side effects, and tolerance of medication regimen.  Therapeutic Interventions: 1 to 1 sessions, Unit Group sessions and Medication administration.  Evaluation of Outcomes: Progressing   RN Treatment Plan for Primary Diagnosis: <principal problem not specified> Long Term Goal(s): Knowledge of disease and therapeutic regimen to maintain health will improve  Short Term Goals: Ability to participate in decision making will improve, Ability to verbalize feelings will improve, Ability to disclose and discuss suicidal ideas, Ability to identify and develop effective coping behaviors will improve and Compliance with prescribed medications will improve  Medication Management: RN will administer medications as ordered by provider, will assess and evaluate patient's response and provide education to patient for prescribed medication. RN will report any adverse and/or side effects to prescribing provider.  Therapeutic Interventions: 1 on 1 counseling sessions, Psychoeducation, Medication administration, Evaluate responses to treatment, Monitor vital signs and CBGs as ordered, Perform/monitor CIWA, COWS, AIMS and Fall Risk screenings as ordered, Perform wound care treatments as ordered.  Evaluation of Outcomes: Progressing   LCSW Treatment Plan for Primary Diagnosis: <principal problem not specified> Long Term Goal(s): Safe transition to appropriate next level of care at discharge, Engage patient in therapeutic group addressing interpersonal concerns.  Short Term Goals: Engage patient in aftercare planning with referrals and resources  Therapeutic Interventions: Assess for all discharge needs, 1 to 1 time with Social worker, Explore available  resources and support systems, Assess for adequacy in community support network, Educate family and significant other(s) on suicide prevention, Complete Psychosocial Assessment, Interpersonal group therapy.  Evaluation of Outcomes: Progressing   Progress in Treatment: Attending groups: No. Participating in groups: No. Taking medication as prescribed: Yes. Toleration medication: Yes. Family/Significant other contact made: No, will contact:  the patient's mother Patient understands diagnosis: Yes. Discussing patient identified problems/goals with staff: Yes. Medical problems stabilized or resolved: Yes. Denies suicidal/homicidal ideation: Yes. Issues/concerns per patient self-inventory: No. Other:   New problem(s) identified: None  New Short Term/Long Term Goal(s): medication stabilization, elimination of SI thoughts, development of comprehensive mental wellness plan.    Patient Goals: Sometimes I feel I want to die. I feel like my husband is not right, he drinks a lot.   Discharge Plan or Barriers: Patient reports she is discharging home with her mother and plans to follow up with a medication management and therapy provider with Spanish-speaking services. CSW will continue to follow and assess for any additional referrals.   Reason for Continuation of Hospitalization: Anxiety Depression Medication stabilization  Estimated Length of Stay: 01/22/2019  Attendees: Patient: 01/20/2019 11:13 AM  Physician: Dr.Fernando Cobos, MD 01/20/2019 11:13 AM  Nursing: Arlyss Repress.Shela Commons RN 01/20/2019 11:13 AM  RN Care Manager: Onnie Boer, RN 01/20/2019 11:13 AM  Social Worker: Baldo Daub, LCSWA 01/20/2019 11:13 AM  Recreational Therapist:  01/20/2019 11:13 AM  Other: Beverly Gust, NP 01/20/2019 11:13 AM  Other:  01/20/2019 11:13 AM  Other: 01/20/2019 11:13 AM    Scribe for Treatment Team: Maeola Sarah, LCSWA 01/20/2019 11:13 AM

## 2019-01-20 NOTE — Progress Notes (Signed)
[facility] MD Progress Note Interviewed in [place] 01/20/2019 6:57 PM Kristen Frank  MRN:  [id number] Subjective: Patient reports she is feeling better than she did on admission.  Currently denies suicidal ideations.  Presents future oriented and hopeful for discharge soon.  States she plans to stay with her mother for period of time.  At this time does not endorse medication side effects.  Objective: Have discussed case with treatment team and have met with patient.  33 year old female, lives with boyfriend and 3 children.  Self-employed.  Spanish-speaking only. Presented voluntarily to hospital reporting worsening depression, suicidal ideations which she describes as passive, prominent neurovegetative symptoms.  Attributes to relationship discord.  States her boyfriend drinks heavily, is often verbally abusive, demeaning, insulting, blaming her for "everything". She denies any prior history of psychiatric treatment, does endorse past episodes of depression and a remote history of psychotic symptoms during a prior depressive episode but not recently.   Today patient presents with improving mood and range of affect, not tearful.  Future oriented and stating that she plans to go live with family for period of time after she discharges.  No disruptive or agitated behaviors on unit, going to some groups.  Although reading Celexa well thus far, denies side effects.  TSH 2.65 (within normal limits) Principal Problem: MDD Diagnosis: Active Problems:   MDD (major depressive disorder), single episode, severe , no psychosis ([place])  Total Time spent with patient: 20 minutes  Past Psychiatric History:   Past Medical History:  Past Medical History:  Diagnosis Date  . HSV-2 infection   . Medical history non-contributory   . No pertinent past medical history    History reviewed. No pertinent surgical history. Family History:  Family History  Problem Relation Age of Onset  . Asthma Brother    Family  Psychiatric  History:  Social History:  Social History   Substance and Sexual Activity  Alcohol Use No     Social History   Substance and Sexual Activity  Drug Use No    Social History   Socioeconomic History  . Marital status: Single    Spouse name: Not on file  . Number of children: Not on file  . Years of education: Not on file  . Highest education level: Not on file  Occupational History  . Not on file  Social Needs  . Financial resource strain: Not on file  . Food insecurity:    Worry: Not on file    Inability: Not on file  . Transportation needs:    Medical: Not on file    Non-medical: Not on file  Tobacco Use  . Smoking status: Never Smoker  . Smokeless tobacco: Never Used  Substance and Sexual Activity  . Alcohol use: No  . Drug use: No  . Sexual activity: Yes    Birth control/protection: None  Lifestyle  . Physical activity:    Days per week: Not on file    Minutes per session: Not on file  . Stress: Not on file  Relationships  . Social connections:    Talks on phone: Not on file    Gets together: Not on file    Attends religious service: Not on file    Active member of club or organization: Not on file    Attends meetings of clubs or organizations: Not on file    Relationship status: Not on file  Other Topics Concern  . Not on file  Social History Narrative  . Not on  file   Additional Social History:   Sleep: Good  Appetite:  Good  Current Medications: Current Facility-Administered Medications  Medication Dose Route Frequency Provider Last Rate Last Dose  . acetaminophen (TYLENOL) tablet 650 mg  650 mg Oral Q6H PRN Ethelene Hal, NP      . alum & mag hydroxide-simeth (MAALOX/MYLANTA) 200-200-20 MG/5ML suspension 30 mL  30 mL Oral Q4H PRN Ethelene Hal, NP      . citalopram (CELEXA) tablet 10 mg  10 mg Oral Daily Cobos, Myer Peer, MD   10 mg at 01/20/19 0818  . hydrOXYzine (ATARAX/VISTARIL) tablet 25 mg  25 mg Oral TID PRN  Ethelene Hal, NP      . magnesium hydroxide (MILK OF MAGNESIA) suspension 30 mL  30 mL Oral Daily PRN Ethelene Hal, NP      . traZODone (DESYREL) tablet 50 mg  50 mg Oral QHS PRN Ethelene Hal, NP        Lab Results:  Results for orders placed or performed during the hospital encounter of 01/18/19 (from the past 48 hour(s))  TSH     Status: None   Collection Time: 01/20/19  6:07 AM  Result Value Ref Range   TSH 2.658 0.350 - 4.500 uIU/mL    Comment: Performed by a 3rd Generation assay with a functional sensitivity of <=0.01 uIU/mL. Performed at [facility], [place] [address], [place], [place] [address]     Blood Alcohol level:  Lab Results  Component Value Date   ETH <10 60/45/4098    Metabolic Disorder Labs: No results found for: HGBA1C, MPG No results found for: PROLACTIN No results found for: CHOL, TRIG, HDL, CHOLHDL, VLDL, LDLCALC  Physical Findings: AIMS: Facial and Oral Movements Muscles of Facial Expression: None, normal Lips and Perioral Area: None, normal Jaw: None, normal Tongue: None, normal,Extremity Movements Upper (arms, wrists, hands, fingers): None, normal Lower (legs, knees, ankles, toes): None, normal, Trunk Movements Neck, shoulders, hips: None, normal, Overall Severity Severity of abnormal movements (highest score from questions above): None, normal Incapacitation due to abnormal movements: None, normal Patient's awareness of abnormal movements (rate only patient's report): No Awareness, Dental Status Current problems with teeth and/or dentures?: No Does patient usually wear dentures?: No  CIWA:    COWS:     Musculoskeletal: Strength & Muscle Tone: within normal limits Gait & Station: normal Patient leans: N/A  Psychiatric Specialty Exam: Physical Exam  ROS denies headache, denies chest pain, denies shortness of breath  Blood pressure 111/76, pulse 76, temperature 99.1 F (37.3 C), temperature source  Oral, resp. rate 16, height _0  (1.549 m), weight 72.6 kg, unknown if currently breastfeeding.Body mass index is 30.23 kg/m.  General Appearance: Improved grooming  Eye Contact:  Good  Speech:  Normal Rate  Volume:  Normal  Mood:  Improving mood, states she feels better  Affect:  More reactive, smiles briefly at times, not tearful today  Thought Process:  Linear and Descriptions of Associations: Intact  Orientation:  Other:  Fully alert and attentive  Thought Content:  Denies hallucinations, no delusions, less intensely ruminative about relationship stressors  Suicidal Thoughts:  No currently denies suicidal or self-injurious ideations, denies homicidal or violent ideations  Homicidal Thoughts:  No  Memory:  Recent and remote grossly intact  Judgement:  Fair-improving  Insight:  Fair-improving  Psychomotor Activity:  Normal  Concentration:  Concentration: Good and Attention Span: Good  Recall:  Good  Fund of Knowledge:  Good  Language:  Good  Akathisia:  Negative  Handed:  Right  AIMS (if indicated):     Assets:  Communication Skills Desire for Improvement Resilience  ADL's:  Intact  Cognition:  WNL  Sleep:  Number of Hours: 6.25   Assessment:  33 year old female, lives with boyfriend and 3 children.  Self-employed.  Spanish-speaking only. Presented voluntarily to hospital reporting worsening depression, suicidal ideations which she describes as passive, prominent neurovegetative symptoms.  Attributes to relationship discord.  States her boyfriend drinks heavily, is often verbally abusive, demeaning, insulting, blaming her for "everything". She denies any prior history of psychiatric treatment, does endorse past episodes of depression and a remote history of psychotic symptoms during a prior depressive episode but not recently.   Today patient presents with improving mood and range of affect, less ruminative, denies suicidal ideations, future oriented and hoping for discharge  soon.  Tolerating Celexa trial well thus far.   Treatment Plan Summary: Daily contact with patient to assess and evaluate symptoms and progress in treatment, Medication management, Plan Inpatient treatment and Medications as below Encourage group and milieu participation to work on coping skills and symptom reduction Continue Celexa 10 mg daily for depression/anxiety Continue Vistaril 25 mg every 8 hours PRN for anxiety as needed Continue Trazodone 50 mg nightly PRN for insomnia as needed Treatment team working on disposition planning options Jenne Campus, MD 01/20/2019, 6:57 PM

## 2019-01-20 NOTE — Progress Notes (Signed)
Patient ID: Kristen Frank, female   DOB: 1986-05-04, 33 y.o.   MRN: [id number]   D: Patient pleasant and smiling on approach tonight. Reports that her mood has been better today and is talking about wanting to go home due to missing her kids. Currently denies any SI and contracts for safety on the unit. A: Staff will monitor on q 15 minute checks, follow treatment plan, and give medications as ordered. R: Cooperative on the unit.

## 2019-01-21 MED ORDER — HYDROXYZINE HCL 25 MG PO TABS: 25.0000 mg | ORAL_TABLET | Freq: Three times a day (TID) | ORAL | 0 refills | Status: DC | PRN

## 2019-01-21 MED ORDER — CITALOPRAM HYDROBROMIDE 10 MG PO TABS: 10.0000 mg | ORAL_TABLET | Freq: Every day | ORAL | 0 refills | Status: DC

## 2019-01-21 NOTE — Progress Notes (Signed)
Patient ID: Kristen Frank, female   DOB: [birth year], 33 y.o.   MRN: [id number]   D: Patient pleasant on approach tonight. Smiling and watching tv in dayroom. Reports having a good day. No complaints of any issues. No active SI and contracts on the unit. A: Staff will monitor on q 15 minute checks, follow treatment plan, and give medication as ordered. R: Cooperative on the unit

## 2019-01-21 NOTE — Progress Notes (Signed)
Nutrition Education Note  Pt attended group focusing on general, healthful nutrition education.  RD emphasized the importance of eating regular meals and snacks throughout the day. Consuming sugar-free beverages and incorporating fruits and vegetables into diet when possible. Provided examples of healthy snacks. Patient encouraged to leave group with a goal to improve nutrition/healthy eating.   Diet Order:  Diet Order            Diet regular Room service appropriate? No; Fluid consistency: Thin  Diet effective now             Pt is also offered choice of unit snacks mid-morning and mid-afternoon.  Pt is eating as desired.   If additional nutrition issues arise, please consult RD.    Trenton Gammon, MS, RD, LDN, [facility] Inpatient Clinical Dietitian Pager # [phone] After hours/weekend pager # [phone]

## 2019-01-21 NOTE — BHH Group Notes (Signed)
[facility] Mental Health Association Group Therapy      01/21/2019 2:19 PM  Type of Therapy: Mental Health Association Presentation  Participation Level: Active  Participation Quality: Attentive  Affect: Appropriate  Cognitive: Oriented  Insight: Developing/Improving  Engagement in Therapy: Engaged  Modes of Intervention: Discussion, Education and Socialization  Summary of Progress/Problems: Mental Health Association (MHA) Speaker came to talk about his personal journey with mental health. The pt processed ways by which to relate to the speaker. MHA speaker provided handouts and educational information pertaining to groups and services offered by the [facility]. Pt was engaged in speaker's presentation and was receptive to resources provided.    Alcario Drought Clinical Social Worker

## 2019-01-21 NOTE — Plan of Care (Signed)
Discharge note  Patient verbalizes readiness for discharge. Follow up plan explained, AVS, Transition record and SRA given. Prescriptions and teaching provided. Belongings returned and signed for. Suicide safety plan completed and signed. Patient verbalizes understanding. Patient denies SI/HI and assures this Clinical research associate he will seek assistance should that change. Patient discharged to lobby where mother was waiting for her.  Problem: Education: Goal: Knowledge of [facility] Education information/materials will improve Outcome: Adequate for Discharge Goal: Emotional status will improve Outcome: Adequate for Discharge Goal: Mental status will improve Outcome: Adequate for Discharge Goal: Verbalization of understanding the information provided will improve Outcome: Adequate for Discharge   Problem: Activity: Goal: Interest or engagement in activities will improve Outcome: Adequate for Discharge Goal: Sleeping patterns will improve Outcome: Adequate for Discharge   Problem: Coping: Goal: Ability to verbalize frustrations and anger appropriately will improve Outcome: Adequate for Discharge Goal: Ability to demonstrate self-control will improve Outcome: Adequate for Discharge   Problem: Health Behavior/Discharge Planning: Goal: Identification of resources available to assist in meeting health care needs will improve Outcome: Adequate for Discharge Goal: Compliance with treatment plan for underlying cause of condition will improve Outcome: Adequate for Discharge   Problem: Physical Regulation: Goal: Ability to maintain clinical measurements within normal limits will improve Outcome: Adequate for Discharge   Problem: Safety: Goal: Periods of time without injury will increase Outcome: Adequate for Discharge   Problem: Education: Goal: Ability to make informed decisions regarding treatment will improve Outcome: Adequate for Discharge   Problem: Coping: Goal: Coping ability  will improve Outcome: Adequate for Discharge   Problem: Health Behavior/Discharge Planning: Goal: Identification of resources available to assist in meeting health care needs will improve Outcome: Adequate for Discharge   Problem: Medication: Goal: Compliance with prescribed medication regimen will improve Outcome: Adequate for Discharge   Problem: Self-Concept: Goal: Ability to disclose and discuss suicidal ideas will improve Outcome: Adequate for Discharge Goal: Will verbalize positive feelings about self Outcome: Adequate for Discharge   Problem: Education: Goal: Utilization of techniques to improve thought processes will improve Outcome: Adequate for Discharge Goal: Knowledge of the prescribed therapeutic regimen will improve Outcome: Adequate for Discharge   Problem: Activity: Goal: Interest or engagement in leisure activities will improve Outcome: Adequate for Discharge Goal: Imbalance in normal sleep/wake cycle will improve Outcome: Adequate for Discharge   Problem: Coping: Goal: Coping ability will improve Outcome: Adequate for Discharge Goal: Will verbalize feelings Outcome: Adequate for Discharge   Problem: Health Behavior/Discharge Planning: Goal: Ability to make decisions will improve Outcome: Adequate for Discharge Goal: Compliance with therapeutic regimen will improve Outcome: Adequate for Discharge   Problem: Role Relationship: Goal: Will demonstrate positive changes in social behaviors and relationships Outcome: Adequate for Discharge   Problem: Safety: Goal: Ability to disclose and discuss suicidal ideas will improve Outcome: Adequate for Discharge Goal: Ability to identify and utilize support systems that promote safety will improve Outcome: Adequate for Discharge   Problem: Self-Concept: Goal: Will verbalize positive feelings about self Outcome: Adequate for Discharge Goal: Level of anxiety will decrease Outcome: Adequate for Discharge

## 2019-01-21 NOTE — Progress Notes (Signed)
  [facility] Adult Case Management Discharge Plan :  Will you be returning to the same living situation after discharge:  No. Patient reports she is discharging home with her mother.  At discharge, do you have transportation home?: Yes,  patient reports her mother is picking her up around 6:00pm Do you have the ability to pay for your medications: No.  Release of information consent forms completed and in the chart;  Patient's signature needed at discharge.  Patient to Follow up at: Follow-up Information    [facility] Go on 01/28/2019.   Specialty:  Behavioral Health Why:  Hospital follow up appointment 1/30 at 8:00a. Please bring your photo ID, SSN, current medications, and discharge paperwork from this hospitalization.  Contact information: [address] [place] [address] [phone]           Next level of care provider has access to [facility] Link:yes  Safety Planning and Suicide Prevention discussed: Yes,  with the patient's mother  Have you used any form of tobacco in the last 30 days? (Cigarettes, Smokeless Tobacco, Cigars, and/or Pipes): No  Has patient been referred to the Quitline?: N/A patient is not a smoker  Patient has been referred for addiction treatment: N/A  Kristen Frank, LCSWA 01/21/2019, 12:57 PM

## 2019-01-21 NOTE — BHH Suicide Risk Assessment (Signed)
[facility]   Principal Problem: depression Discharge Diagnoses: Active Problems:   MDD (major depressive disorder), single episode, severe , no psychosis ([facility])   Total Time spent with patient: 30 minutes  Musculoskeletal: Strength & Muscle Tone: within normal limits Gait & Station: normal Patient leans: N/A  Psychiatric Specialty Exam: ROS  No headache, no chest pain, no shortness of breath, no vomiting   Blood pressure 111/76, pulse 76, temperature 99.1 F (37.3 C), temperature source Oral, resp. rate 16, height 5\' 1"  (1.549 m), weight 72.6 kg, unknown if currently breastfeeding.Body mass index is 30.23 kg/m.  General Appearance: Well Groomed  Eye Contact::  Good  Speech:  Normal Rate409  Volume:  Normal  Mood:  reports feeling " a lot better", denies depression at this time  Affect:  much improved, more reactive  Thought Process:  Linear and Descriptions of Associations: Intact  Orientation:  Full (Time, Place, and Person)  Thought Content:  no hallucinations, no delusions, not internally preoccupied  Suicidal Thoughts:  No denies suicidal or self injurious ideations, denies homicidal or violent ideations, specifically also denies any homicidal ideations towards SO  Homicidal Thoughts:  No  Memory:  recent and remote grossly intact   Judgement:  Other:  improved   Insight:  improving   Psychomotor Activity:  Normal  Concentration:  Good  Recall:  Good  Fund of Knowledge:Good  Language: Good  Akathisia:  Negative  Handed:  Right  AIMS (if indicated):     Assets:  Communication Skills Desire for Improvement Resilience  Sleep:  Number of Hours: 6.75  Cognition: WNL  ADL's:  Intact   Mental Status Per Nursing Assessment::   On Admission:  Self-harm thoughts  Demographic Factors:  32, lives with SO and her three children, employed   Loss Factors: Relationship discord/stressors. Language barrier  Historical Factors: No prior  psychiatric admissions, history of prior depressive episodes, history of suicide attempt by cutting wrists as a teenager  Risk Reduction Factors:   Responsible for children under 33 years of age, Employed, Positive social support and Positive coping skills or problem solving skills  Continued Clinical Symptoms:  At this time patient is alert, attentive, well related, mood improved and currently presents euthymic, with fuller range of affect, no thought disorder, no suicidal or self injurious ideations, no homicidal or violent ideations, no hallucinations, no delusions, not internally preoccupied . Future oriented . Behavior on unit in good control. Pleasant on approach. Denies medication side effects, which we reviewed .  Cognitive Features That Contribute To Risk:  No gross cognitive deficits noted upon discharge. Is alert , attentive, and oriented x 3   Suicide Risk:  Mild:  Suicidal ideation of limited frequency, intensity, duration, and specificity.  There are no identifiable plans, no associated intent, mild dysphoria and related symptoms, good self-control (both objective and subjective assessment), few other risk factors, and identifiable protective factors, including available and accessible social support.  Follow-up Information    [facility] Go on 01/28/2019.   Specialty:  Behavioral Health Why:  Hospital follow up appointment 1/30 at 8:00a. Please bring your photo ID, SSN, current medications, and discharge paperwork from this hospitalization.  Contact information: [address] [place] [id number] [phone]           Plan Of Care/Follow-up recommendations:  Activity:  as tolerated  Diet:  regular Tests:  NA Other:  See below  Patient expresses readiness for discharge and is leaving unit in good spirits  Plans to go live with her mother Follow up as above   Craige CottaFernando A Cobos, MD 01/21/2019, 8:24 AM
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
## 2020-04-21 ENCOUNTER — Ambulatory Visit: Payer: Self-pay | Attending: Internal Medicine

## 2020-04-21 ENCOUNTER — Ambulatory Visit: Payer: Self-pay

## 2020-04-21 DIAGNOSIS — Z23 Encounter for immunization: Secondary | ICD-10-CM

## 2020-04-21 NOTE — Progress Notes (Signed)
   Covid-19 Vaccination Clinic  Name:  Ivonna Kinnick    MRN: [id number] DOB: [birth year]  04/21/2020  Ms. Malachi was observed post Covid-19 immunization for 15 minutes without incident. She was provided with Vaccine Information Sheet and instruction to access the V-Safe system.   Ms. Mimnaugh was instructed to call 911 with any severe reactions post vaccine: Marland Kitchen Difficulty breathing  . Swelling of face and throat  . A fast heartbeat  . A bad rash all over body  . Dizziness and weakness   Immunizations Administered    Name Date Dose VIS Date Route   Pfizer COVID-19 Vaccine 04/21/2020 10:19 AM 0.3 mL 02/23/2019 Intramuscular   Manufacturer: [employer], [employer]   Lot: [id number]   NDC: [address]-7

## 2020-05-15 ENCOUNTER — Ambulatory Visit: Payer: Self-pay

## 2020-05-15 ENCOUNTER — Ambulatory Visit: Payer: Self-pay | Attending: Internal Medicine

## 2020-05-15 DIAGNOSIS — Z23 Encounter for immunization: Secondary | ICD-10-CM

## 2020-05-15 NOTE — Progress Notes (Signed)
   Covid-19 Vaccination Clinic  Name:  Harriett Azar    MRN: [id number] DOB: 1986-06-05  05/15/2020  Ms. Nofsinger was observed post Covid-19 immunization for 15 minutes without incident. She was provided with Vaccine Information Sheet and instruction to access the V-Safe system.   Ms. Daloia was instructed to call 911 with any severe reactions post vaccine: Marland Kitchen Difficulty breathing  . Swelling of face and throat  . A fast heartbeat  . A bad rash all over body  . Dizziness and weakness   Immunizations Administered    Name Date Dose VIS Date Route   Pfizer COVID-19 Vaccine 05/15/2020  9:47 AM 0.3 mL 02/23/2019 Intramuscular   Manufacturer: [employer], [employer]   Lot: [id number]   NDC: [address]-8

## 2020-07-25 ENCOUNTER — Encounter (HOSPITAL_COMMUNITY): Payer: Self-pay | Admitting: Emergency Medicine

## 2020-07-25 ENCOUNTER — Emergency Department (HOSPITAL_COMMUNITY)
Admission: EM | Admit: 2020-07-25 | Discharge: 2020-07-25 | Disposition: A | Discharge status: Home / Self Care | Payer: Medicaid Other | Attending: Emergency Medicine | Admitting: Emergency Medicine

## 2020-07-25 ENCOUNTER — Other Ambulatory Visit: Payer: Self-pay

## 2020-07-25 DIAGNOSIS — Z91013 Allergy to seafood: Secondary | ICD-10-CM | POA: Diagnosis not present

## 2020-07-25 DIAGNOSIS — L500 Allergic urticaria: Secondary | ICD-10-CM | POA: Diagnosis not present

## 2020-07-25 DIAGNOSIS — T7840XA Allergy, unspecified, initial encounter: Secondary | ICD-10-CM

## 2020-07-25 DIAGNOSIS — T781XXA Other adverse food reactions, not elsewhere classified, initial encounter: Secondary | ICD-10-CM | POA: Diagnosis present

## 2020-07-25 MED ORDER — METHYLPREDNISOLONE 4 MG PO TBPK: ORAL_TABLET | ORAL | 0 refills | Status: DC

## 2020-07-25 MED ORDER — HYDROXYZINE HCL 25 MG PO TABS: 25.0000 mg | ORAL_TABLET | Freq: Four times a day (QID) | ORAL | 0 refills | Status: DC

## 2020-07-25 MED ORDER — EPINEPHRINE 0.3 MG/0.3ML IJ SOAJ: 0.3000 mg | Freq: Once | INTRAMUSCULAR | 1 refills | Status: DC | PRN

## 2020-07-25 NOTE — ED Triage Notes (Signed)
Pt states at 4pm she ate some shrimp and then began to have hives and itching and had some swelling in cheeks. Pt states last night her throat felt itching but that has resolved.  Pt has rash on arms and chest and a couple bumps on face. Pt denies any oral swelling or trouble breathing. Airway intact.

## 2020-07-25 NOTE — Discharge Instructions (Addendum)
Get help right away if: °You develop symptoms of an allergic reaction. You may notice them soon after you are exposed to a substance. Symptoms may include: °Flushed skin. °Hives. °Swelling of the eyes, lips, face, mouth, tongue, or throat. °Difficulty breathing, speaking, or swallowing. °Wheezing. °Dizziness or light-headedness. °Fainting. °Pain or cramping in the abdomen. °Vomiting. °Diarrhea. °You used epinephrine. You need more medical care even if the medicine seems to be working. This is important because anaphylaxis may happen again within 72 hours (rebound anaphylaxis). You may need more doses of epinephrine. °

## 2020-07-25 NOTE — ED Provider Notes (Signed)
[facility] EMERGENCY DEPARTMENT Provider Note   CSN: [id number] Arrival date & time: 07/25/20  1529     History Chief Complaint  Patient presents with  . Allergic Reaction    Kristen Frank is a 34 y.o. female who presents emergency department with chief complaint of allergic reaction.  Patient states that she has eaten shrimp in the past and developed hives.  She had some last night and developed facial rash and itching over her arms.  She took Benadryl was able to get to sleep.  This morning she continues to have the rash.  She denies nausea vomiting, syncope, wheezing, swelling in her throat.  HPI     Past Medical History:  Diagnosis Date  . HSV-2 infection   . Medical history non-contributory   . No pertinent past medical history     Patient Active Problem List   Diagnosis Date Noted  . MDD (major depressive disorder), single episode, severe , no psychosis (HCC) 01/18/2019  . Active labor at term 02/09/2016  . Labor and delivery, indication for care 03/08/2015  . Evaluate anatomy not seen on prior sonogram   . [redacted] weeks gestation of pregnancy   . Preeclampsia 04/20/2012    History reviewed. No pertinent surgical history.   OB History    Gravida  3   Para  3   Term  3   Preterm      AB      Living  3     SAB      TAB      Ectopic      Multiple  0   Live Births  3           Family History  Problem Relation Age of Onset  . Asthma Brother     Social History   Tobacco Use  . Smoking status: Never Smoker  . Smokeless tobacco: Never Used  Substance Use Topics  . Alcohol use: No  . Drug use: No    Home Medications Prior to Admission medications   Medication Sig Start Date End Date Taking? Authorizing Provider  citalopram (CELEXA) 10 MG tablet Take 1 tablet (10 mg total) by mouth daily. For mood 01/22/19   Aldean Baker, NP  hydrOXYzine (ATARAX/VISTARIL) 25 MG tablet Take 1 tablet (25 mg total) by mouth 3 (three)  times daily as needed for anxiety. 01/21/19   Aldean Baker, NP    Allergies    Shrimp [shellfish allergy]  Review of Systems   Review of Systems Ten systems reviewed and are negative for acute change, except as noted in the HPI.   Physical Exam Updated Vital Signs BP 125/79   Pulse 86   Temp 98.5 F (36.9 C) (Oral)   Resp 16   SpO2 100%   Physical Exam Vitals and nursing note reviewed.  Constitutional:      General: She is not in acute distress.    Appearance: She is well-developed. She is not diaphoretic.  HENT:     Head: Normocephalic and atraumatic.     Mouth/Throat:     Mouth: Mucous membranes are moist.     Comments: Normal phonation, uvula midline Eyes:     General: No scleral icterus.    Extraocular Movements: Extraocular movements intact.     Conjunctiva/sclera: Conjunctivae normal.     Pupils: Pupils are equal, round, and reactive to light.  Cardiovascular:     Rate and Rhythm: Normal rate and regular rhythm.  Heart sounds: Normal heart sounds. No murmur heard.  No friction rub. No gallop.   Pulmonary:     Effort: Pulmonary effort is normal. No respiratory distress.     Breath sounds: Normal breath sounds. No wheezing.  Abdominal:     General: Bowel sounds are normal. There is no distension.     Palpations: Abdomen is soft. There is no mass.     Tenderness: There is no abdominal tenderness. There is no guarding.  Musculoskeletal:     Cervical back: Normal range of motion.  Skin:    General: Skin is warm and dry.     Findings: Rash present. Rash is urticarial.     Comments: Hives on the arms and right side of the face, no lip involvement.  Neurological:     Mental Status: She is alert and oriented to person, place, and time.  Psychiatric:        Behavior: Behavior normal.     ED Results / Procedures / Treatments   Labs (all labs ordered are listed, but only abnormal results are displayed) Labs Reviewed - No data to  display  EKG None  Radiology No results found.  Procedures Procedures (including critical care time)  Medications Ordered in ED Medications - No data to display  ED Course  I have reviewed the triage vital signs and the nursing notes.  Pertinent labs & imaging results that were available during my care of the patient were reviewed by me and considered in my medical decision making (see chart for details).    MDM Rules/Calculators/A&P                          Patient here with allergic reaction since last night.  She is not had any medications today.  She denies any airway involvement.  No other signs or symptoms of anaphylaxis.  She has been here in the emergency department for 4 hours.  Without any worsening in her symptoms.  Patient be discharged with prednisone, Vistaril and EpiPen.  Discussed outpatient follow-up and return precautions. Final Clinical Impression(s) / ED Diagnoses Final diagnoses:  None    Rx / DC Orders ED Discharge Orders    None       Arthor Captain, PA-C 07/25/20 2125    Wynetta Fines, MD 07/27/20 [phone]

## 2020-07-25 NOTE — ED Notes (Signed)
Patient verbalizes understanding of discharge instructions. Opportunity for questioning and answers were provided. Armband removed by staff, pt discharged from ED.  

## 2020-08-26 ENCOUNTER — Other Ambulatory Visit: Payer: Self-pay

## 2020-08-26 ENCOUNTER — Encounter (HOSPITAL_COMMUNITY): Payer: Self-pay | Admitting: Emergency Medicine

## 2020-08-26 ENCOUNTER — Emergency Department (HOSPITAL_COMMUNITY)
Admission: EM | Admit: 2020-08-26 | Discharge: 2020-08-26 | Disposition: A | Discharge status: Home / Self Care | Payer: Medicaid Other | Attending: Emergency Medicine | Admitting: Emergency Medicine

## 2020-08-26 DIAGNOSIS — R519 Headache, unspecified: Secondary | ICD-10-CM | POA: Insufficient documentation

## 2020-08-26 DIAGNOSIS — Z5321 Procedure and treatment not carried out due to patient leaving prior to being seen by health care provider: Secondary | ICD-10-CM | POA: Insufficient documentation

## 2020-08-26 NOTE — ED Triage Notes (Signed)
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 Pt states he BP got very low today but does not know how low. Pt has bleached hair in bag for visual aid, pt states he scalp gets very hot when she is in the sun since having to shave her head.  Pt would like her blood work and her head checked, interpretor ipad used for triage dialogue.

## 2020-08-26 NOTE — ED Notes (Signed)
Called for x4 no reply

## 2020-09-26 ENCOUNTER — Other Ambulatory Visit: Payer: Self-pay

## 2020-09-26 ENCOUNTER — Emergency Department (HOSPITAL_COMMUNITY)
Admission: EM | Admit: 2020-09-26 | Discharge: 2020-09-26 | Disposition: A | Discharge status: Home / Self Care | Payer: Medicaid Other | Attending: Emergency Medicine | Admitting: Emergency Medicine

## 2020-09-26 ENCOUNTER — Emergency Department (HOSPITAL_COMMUNITY): Payer: Medicaid Other

## 2020-09-26 ENCOUNTER — Encounter (HOSPITAL_COMMUNITY): Payer: Self-pay | Admitting: Emergency Medicine

## 2020-09-26 DIAGNOSIS — M79602 Pain in left arm: Secondary | ICD-10-CM | POA: Diagnosis not present

## 2020-09-26 DIAGNOSIS — R0602 Shortness of breath: Secondary | ICD-10-CM

## 2020-09-26 DIAGNOSIS — R079 Chest pain, unspecified: Secondary | ICD-10-CM | POA: Diagnosis not present

## 2020-09-26 LAB — CBC
HCT: 44.1 % (ref 36.0–46.0)
Hemoglobin: 14.2 g/dL (ref 12.0–15.0)
MCH: 29.8 pg (ref 26.0–34.0)
MCHC: 32.2 g/dL (ref 30.0–36.0)
MCV: 92.6 fL (ref 80.0–100.0)
Platelets: 376 10*3/uL (ref 150–400)
RBC: 4.76 MIL/uL (ref 3.87–5.11)
RDW: 11.9 % (ref 11.5–15.5)
WBC: 11.5 10*3/uL — ABNORMAL HIGH (ref 4.0–10.5)
nRBC: 0 % (ref 0.0–0.2)

## 2020-09-26 LAB — BASIC METABOLIC PANEL
Anion gap: 9 (ref 5–15)
BUN: 14 mg/dL (ref 6–20)
CO2: 24 mmol/L (ref 22–32)
Calcium: 9.6 mg/dL (ref 8.9–10.3)
Chloride: 103 mmol/L (ref 98–111)
Creatinine, Ser: 0.62 mg/dL (ref 0.44–1.00)
GFR calc Af Amer: >60 mL/min (ref >60)
GFR calc non Af Amer: >60 mL/min (ref >60)
Glucose, Bld: 103 mg/dL — ABNORMAL HIGH (ref 70–99)
Potassium: 4 mmol/L (ref 3.5–5.1)
Sodium: 136 mmol/L (ref 135–145)

## 2020-09-26 LAB — TROPONIN I (HIGH SENSITIVITY): Troponin I (High Sensitivity): <2 ng/L (ref <18)

## 2020-09-26 LAB — I-STAT BETA HCG BLOOD, ED (MC, WL, AP ONLY): I-stat hCG, quantitative: <5 m[IU]/mL (ref <5)

## 2020-09-26 MED ORDER — NAPROXEN 375 MG PO TABS: 375.0000 mg | ORAL_TABLET | Freq: Two times a day (BID) | ORAL | 0 refills | Status: DC

## 2020-09-26 NOTE — ED Triage Notes (Signed)
Pt c/o left arm pain and left sided chest pain that started last night.  Denies SOB, N/V/D, dizziness.

## 2020-09-26 NOTE — ED Provider Notes (Signed)
[facility]   CSN: [id number] Arrival date & time: 09/26/20  0232     History Chief Complaint  Patient presents with  . Chest Pain  . Arm Pain    Kristen Frank is a 34 y.o. female.  Patient with history of depression presents the emergency department today for complaint of left arm pain.  Patient states that she had an allergic reaction in July for which she was prescribed an EpiPen, steroids, hydroxyzine.  Since taking these medications she states that every day she has had recurrent symptoms of feeling fatigued, having swelling around her eyes, feeling of heat on the inside of her body, heat and sun intolerance she is outside, anxiety causing her to bite her fingernails, and various other pains.  She states that last evening she developed pain that started in her left shoulder and moved down into her left hand.  She states that she went to sleep last night and it woke her up, prompting emergency department evaluation.  She has had subjective shortness of breath with the symptoms.  When she moves her arm around, does not cause her arm to hurt more, however it does cause pain in her left lateral chest.  No treatments prior to arrival.  No vomiting, diaphoresis, weakness.  Symptoms are not exertional.  She has had pain like this in the past over the past several weeks.  She does not currently have a primary care doctor and has not sought evaluation of this.  Patient denies risk factors for pulmonary embolism including: unilateral leg swelling, history of DVT/PE/other blood clots, use of exogenous hormones, recent immobilizations, recent surgery, recent travel (>4hr segment), malignancy, hemoptysis.          Past Medical History:  Diagnosis Date  . HSV-2 infection   . Medical history non-contributory   . No pertinent past medical history     Patient Active Problem List   Diagnosis Date Noted  . MDD (major depressive disorder),  single episode, severe , no psychosis (HCC) 01/18/2019  . Active labor at term 02/09/2016  . Labor and delivery, indication for care 03/08/2015  . Evaluate anatomy not seen on prior sonogram   . [redacted] weeks gestation of pregnancy   . Preeclampsia 04/20/2012    History reviewed. No pertinent surgical history.   OB History    Gravida  3   Para  3   Term  3   Preterm      AB      Living  3     SAB      TAB      Ectopic      Multiple  0   Live Births  3           Family History  Problem Relation Age of Onset  . Asthma Brother     Social History   Tobacco Use  . Smoking status: Never Smoker  . Smokeless tobacco: Never Used  Substance Use Topics  . Alcohol use: No  . Drug use: No    Home Medications Prior to Admission medications   Medication Sig Start Date End Date Taking? Authorizing Provider  citalopram (CELEXA) 10 MG tablet Take 1 tablet (10 mg total) by mouth daily. For mood 01/22/19   Aldean BakerSykes, Janet E, NP  EPINEPHrine (EPIPEN 2-PAK) 0.3 mg/0.3 mL IJ SOAJ injection Inject 0.3 mLs (0.3 mg total) into the muscle once as needed for up to 1 dose (for severe allergic  reaction). CAll 911 immediately if you have to use this medicine 07/25/20   Arthor Captain, PA-C  naproxen (NAPROSYN) 375 MG tablet Take 1 tablet (375 mg total) by mouth 2 (two) times daily. 09/26/20   Renne Crigler, PA-C    Allergies    Shrimp [shellfish allergy]  Review of Systems   Review of Systems  Constitutional: Positive for fatigue. Negative for diaphoresis and fever.  HENT: Negative for rhinorrhea and sore throat.   Eyes: Negative for redness.  Respiratory: Positive for shortness of breath. Negative for cough.   Cardiovascular: Negative for chest pain, palpitations and leg swelling.  Gastrointestinal: Negative for abdominal pain, diarrhea, nausea and vomiting.  Genitourinary: Negative for dysuria, frequency, hematuria and urgency.  Musculoskeletal: Positive for myalgias. Negative for  back pain and neck pain.  Skin: Negative for rash.  Neurological: Negative for syncope, light-headedness and headaches.  Psychiatric/Behavioral: The patient is nervous/anxious.     Physical Exam Updated Vital Signs BP 107/80 (BP Location: Right Arm)   Pulse 79   Temp 98.6 F (37 C) (Oral)   Resp 16   SpO2 100%   Physical Exam Vitals and nursing note reviewed.  Constitutional:      Appearance: She is well-developed. She is not diaphoretic.  HENT:     Head: Normocephalic and atraumatic.     Mouth/Throat:     Mouth: Mucous membranes are not dry.  Eyes:     Conjunctiva/sclera: Conjunctivae normal.  Neck:     Vascular: Normal carotid pulses. No carotid bruit or JVD.     Trachea: Trachea normal. No tracheal deviation.  Cardiovascular:     Rate and Rhythm: Normal rate and regular rhythm.     Pulses: No decreased pulses.     Heart sounds: Normal heart sounds, S1 normal and S2 normal. No murmur heard.   Pulmonary:     Effort: Pulmonary effort is normal. No respiratory distress.     Breath sounds: No wheezing.  Chest:     Chest wall: Tenderness present.     Comments: Pt reports tenderness to palpation over the lateral left chest wall.  Abdominal:     General: Bowel sounds are normal.     Palpations: Abdomen is soft.     Tenderness: There is no abdominal tenderness. There is no guarding or rebound.  Musculoskeletal:        General: Normal range of motion.     Cervical back: Normal range of motion and neck supple. No muscular tenderness.     Comments: Patient's left upper extremity evaluated.  She has 2+ radial pulse.  Soft tissue and compartments of the forearm and upper arm are soft without signs of swelling.  Arm is symmetric in size when compared to the right.  No signs of cellulitis, rashes, abscess.  Normal distal sensation.  2+ capillary refill bilaterally.  Skin:    General: Skin is warm and dry.     Coloration: Skin is not pale.  Neurological:     Mental Status: She is  alert.  Psychiatric:        Mood and Affect: Mood is anxious.     ED Results / Procedures / Treatments   Labs (all labs ordered are listed, but only abnormal results are displayed) Labs Reviewed  BASIC METABOLIC PANEL - Abnormal; Notable for the following components:      Result Value   Glucose, Bld 103 (*)    All other components within normal limits  CBC - Abnormal; Notable for the  following components:   WBC 11.5 (*)    All other components within normal limits  I-STAT BETA HCG BLOOD, ED ([facility], [facility], [facility] ONLY)  TROPONIN I (HIGH SENSITIVITY)  TROPONIN I (HIGH SENSITIVITY)    EKG EKG Interpretation  Date/Time:  Tuesday September 26 2020 14:24:17 EDT Ventricular Rate:  94 PR Interval:  142 QRS Duration: 110 QT Interval:  345 QTC Calculation: 430 R Axis:   54 Text Interpretation: Sinus rhythm Consider right atrial enlargement Incomplete left bundle branch block Low voltage, precordial leads Interpretation limited secondary to artifact Confirmed by Pricilla Loveless [phone]) on 09/26/2020 2:39:00 PM   Radiology DG Chest 2 View  Result Date: 09/26/2020 CLINICAL DATA:  Left arm pain and left-sided chest pain starting last night EXAM: CHEST - 2 VIEW COMPARISON:  None. FINDINGS: The heart size and mediastinal contours are within normal limits. Both lungs are clear. The visualized skeletal structures are unremarkable. IMPRESSION: No active cardiopulmonary disease. Electronically Signed   By: Burman Nieves M.D.   On: 09/26/2020 03:31    Procedures Procedures (including critical care time)  Medications Ordered in ED Medications - No data to display  ED Course  I have reviewed the triage vital signs and the nursing notes.  Pertinent labs & imaging results that were available during my care of the patient were reviewed by me and considered in my medical decision making (see chart for details).  Patient seen and examined. Work-up reviewed and is negative.  Chest x-ray is clear.  I  reviewed EKG from 2:50 AM today which is normal.  EKG from 2:24 PM has significant artifact and is difficult to interpret.  After speaking with patient, I do not feel that she has any significant life-threatening conditions.  Her arm pain may be musculoskeletal in nature or possibly nerve related.  She does have some chest wall tenderness.  Troponin negative x1.  EKG is nonischemic.  She does not have any significant risk factors for PE and no clinical signs of DVT.  She would benefit from a routine physical exam and routine lab evaluation, that we discussed we do not do here in the emergency department.  She is agreeable to following up.  I have provided resources and encouraged patient to follow-up.  Will give prescription for NSAID to see if this helps with the patient's musculoskeletal type pain.  No signs of anaphylaxis today.  3:17 PM Patient was counseled to return with severe chest pain, especially if the pain is crushing or pressure-like and spreads to the arms, back, neck, or jaw, or if they have sweating, nausea, or shortness of breath with the pain. They were encouraged to call 911 with these symptoms.   The patient verbalized understanding and agreed.   Vital signs reviewed and are as follows: BP 107/80 (BP Location: Right Arm)   Pulse 79   Temp 98.6 F (37 C) (Oral)   Resp 16   SpO2 100%     MDM Rules/Calculators/A&P                          Patient with multiple vague symptoms today as discussed above.  Low concern for PE, MI.  No signs of upper extremity DVT.  No neck pain to suggest central cord syndrome.  No signs of cellulitis or abscess.  Lab work-up including troponin, chest x-ray, EKG, basic labs is very reassuring.  No indications for further evaluation in emergency department today, however I do feel patient  would benefit from primary care follow-up.  Resources given.  Return instructions as above.     Final Clinical Impression(s) / ED Diagnoses Final diagnoses:    Left arm pain  Shortness of breath    Rx / DC Orders ED Discharge Orders         Ordered    naproxen (NAPROSYN) 375 MG tablet  2 times daily        09/26/20 1500           Renne Crigler, PA-C 09/26/20 1518    Pricilla Loveless, MD 09/26/20 1539

## 2020-09-26 NOTE — Discharge Instructions (Signed)
Please read and follow all provided instructions.  Your diagnoses today include:  1. Left arm pain   2. Shortness of breath     Tests performed today include:  An EKG of your heart  A chest x-ray  Cardiac enzymes - a blood test for heart muscle damage, no sign of heart attack  Blood counts and electrolytes  Vital signs. See below for your results today.   Medications prescribed:   Naproxen - anti-inflammatory pain medication  Do not exceed 500mg  naproxen every 12 hours, take with food  You have been prescribed an anti-inflammatory medication or NSAID. Take with food. Take smallest effective dose for the shortest duration needed for your pain. Stop taking if you experience stomach pain or vomiting.   Take any prescribed medications only as directed.  Follow-up instructions: Please follow-up with your primary care provider as soon as you can for further evaluation of your symptoms.   Return instructions:  SEEK IMMEDIATE MEDICAL ATTENTION IF:  You have severe chest pain, especially if the pain is crushing or pressure-like and spreads to the arms, back, neck, or jaw, or if you have sweating, nausea (feeling sick to your stomach), or shortness of breath. THIS IS AN EMERGENCY. Don't wait to see if the pain will go away. Get medical help at once. Call 911 or 0 (operator). DO NOT drive yourself to the hospital.   Your chest pain gets worse and does not go away with rest.   You have an attack of chest pain lasting longer than usual, despite rest and treatment with the medications your caregiver has prescribed.   You wake from sleep with chest pain or shortness of breath.  You feel dizzy or faint.  You have chest pain not typical of your usual pain for which you originally saw your caregiver.   You have any other emergent concerns regarding your health.  Additional Information: Chest pain comes from many different causes. Your caregiver has diagnosed you as having chest pain  that is not specific for one problem, but does not require admission.  You are at low risk for an acute heart condition or other serious illness.   Your vital signs today were: BP 107/80 (BP Location: Right Arm)   Pulse 79   Temp 98.6 F (37 C) (Oral)   Resp 16   SpO2 100%  If your blood pressure (BP) was elevated above 135/85 this visit, please have this repeated by your doctor within one month. --------------

## 2020-10-19 ENCOUNTER — Ambulatory Visit: Payer: Medicaid Other | Admitting: Family Medicine

## 2020-11-09 ENCOUNTER — Encounter: Payer: Self-pay | Admitting: Family Medicine

## 2020-11-09 ENCOUNTER — Other Ambulatory Visit: Payer: Self-pay

## 2020-11-09 ENCOUNTER — Ambulatory Visit: Payer: Medicaid Other | Attending: Family Medicine | Admitting: Family Medicine

## 2020-11-09 VITALS — BP 121/83 | HR 76 | Wt 159.2 lb

## 2020-11-09 DIAGNOSIS — R8290 Unspecified abnormal findings in urine: Secondary | ICD-10-CM | POA: Diagnosis not present

## 2020-11-09 DIAGNOSIS — H539 Unspecified visual disturbance: Secondary | ICD-10-CM

## 2020-11-09 DIAGNOSIS — R42 Dizziness and giddiness: Secondary | ICD-10-CM | POA: Diagnosis not present

## 2020-11-09 DIAGNOSIS — R350 Frequency of micturition: Secondary | ICD-10-CM

## 2020-11-09 DIAGNOSIS — Z758 Other problems related to medical facilities and other health care: Secondary | ICD-10-CM

## 2020-11-09 DIAGNOSIS — Z789 Other specified health status: Secondary | ICD-10-CM | POA: Diagnosis not present

## 2020-11-09 DIAGNOSIS — T7840XA Allergy, unspecified, initial encounter: Secondary | ICD-10-CM | POA: Diagnosis not present

## 2020-11-09 DIAGNOSIS — Z603 Acculturation difficulty: Secondary | ICD-10-CM

## 2020-11-09 LAB — POCT URINALYSIS DIP (CLINITEK)
Bilirubin, UA: NEGATIVE
Glucose, UA: NEGATIVE mg/dL
Ketones, POC UA: NEGATIVE mg/dL
Leukocytes, UA: NEGATIVE
Nitrite, UA: NEGATIVE
POC PROTEIN,UA: NEGATIVE
Spec Grav, UA: 1.025
Urobilinogen, UA: 0.2 U/dL
pH, UA: 6

## 2020-11-09 NOTE — Progress Notes (Signed)
New Patient Office Visit  Subjective:  Patient ID: Kristen Frank, female    DOB: [birth year]  Age: 34 y.o. MRN: [id number]   Due to language barrier, patient is accompanied by a Spanish-speaking interpreter at today's visit  CC:  Chief Complaint  Patient presents with  . Establish Care    HPI Kristen Frank, 34 year old female who presents to establish care.  She reports that she has been having recurrent allergic reaction since May of this year and she would like to find out what is causing her to have these reactions.  Patient reports that in May of this year, she went to a hairstylist to have her hair dyed white.  Patient reports that after having her hair dyed, she developed an allergic reaction causing blistering of her scalp and eventually causing her hair to fall out.  Since that time she has had an allergic reaction which caused her to have a rash on her arms and face along with swelling of her face.  She did not feel as if she was having any swelling/closing of her throat and no swelling of the mouth or tongue.  She reports that she was seen in the emergency room at the end of July secondary to this rash.  She had eaten shrimp the night before the emergency department visit as she was told that this might be the cause of her allergic reaction.  She reports however that last week she ate shrimp for several days in a row to see if this would cause her to have an allergic reaction but she did not experience any rash or facial swelling after eating shrimp recently.  She does have an EpiPen that was prescribed at the emergency department but has not had to use it.          She reports a past history of depression for which she was on medication but after starting to feel better, she was able to discontinue the medication.  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  After the reaction to the hair dye back in May however she feels that she again developed some depression and anxiety  due to the reaction to the hair dye and loss of her hair and she also lost clients from her business during that time.  She states that she has been having issues with increased anxiety and depression symptoms since the incident in May.  In September of this year she had an episode of chest pain and left arm pain for which she went to the emergency department.  She states that she was told that her chest pain and arm pain were related to anxiety.  She reports that she was prescribed Celexa by her doctor to help with anxiety however the medication made her feel funny and she stopped taking it.  She would like further testing to find out the cause of her allergy symptoms.  On review of systems, she reports that she has had issues with frontal headaches, dizziness and blurred vision.  She states that she believes that some of the product that was used to dye her hair back in May may have gotten into her eyes causing her to have changes in her vision.  She denies any issues with nasal congestion, no runny nose or postnasal drainage.  She has had no additional episodes of chest pain or palpitations.  She has felt at times as if she has had some joint pain and some swelling in  her legs.  She reports that she has had some urinary frequency.  She denies any increased thirst.  Past Medical History:  Diagnosis Date  . History of depression   . HSV-2 infection     Past Surgical History:  Procedure Laterality Date  . No past surgery      Family History  Problem Relation Age of Onset  . Asthma Brother   . Heart disease Neg Hx     Social History   Socioeconomic History  . Marital status: Single    Spouse name: Not on file  . Number of children: Not on file  . Years of education: Not on file  . Highest education level: Not on file  Occupational History  . Not on file  Tobacco Use  . Smoking status: Never Smoker  . Smokeless tobacco: Never Used  Substance and Sexual Activity  . Alcohol use: No  .  Drug use: No  . Sexual activity: Yes    Birth control/protection: None  Other Topics Concern  . Not on file  Social History Narrative  . Not on file   Social Determinants of Health   Financial Resource Strain:   . Difficulty of Paying Living Expenses: Not on file  Food Insecurity:   . Worried About Programme researcher, broadcasting/film/video in the Last Year: Not on file  . Ran Out of Food in the Last Year: Not on file  Transportation Needs:   . Lack of Transportation (Medical): Not on file  . Lack of Transportation (Non-Medical): Not on file  Physical Activity:   . Days of Exercise per Week: Not on file  . Minutes of Exercise per Session: Not on file  Stress:   . Feeling of Stress : Not on file  Social Connections:   . Frequency of Communication with Friends and Family: Not on file  . Frequency of Social Gatherings with Friends and Family: Not on file  . Attends Religious Services: Not on file  . Active Member of Clubs or Organizations: Not on file  . Attends Banker Meetings: Not on file  . Marital Status: Not on file  Intimate Partner Violence:   . Fear of Current or Ex-Partner: Not on file  . Emotionally Abused: Not on file  . Physically Abused: Not on file  . Sexually Abused: Not on file    ROS Review of Systems  Constitutional: Positive for fatigue. Negative for chills and fever.  HENT: Negative for sore throat and trouble swallowing.   Eyes: Positive for visual disturbance. Negative for photophobia.  Respiratory: Negative for cough and shortness of breath.   Cardiovascular: Positive for leg swelling. Negative for chest pain and palpitations.  Gastrointestinal: Negative for abdominal pain, constipation, diarrhea and nausea.  Endocrine: Positive for polyuria. Negative for polydipsia and polyphagia.  Genitourinary: Positive for frequency. Negative for dysuria.  Musculoskeletal: Positive for arthralgias and joint swelling.  Skin: Positive for rash (With allergic reaction but  no recent rash).  Neurological: Positive for dizziness and headaches.  Hematological: Negative for adenopathy. Does not bruise/bleed easily.  Psychiatric/Behavioral: Negative for suicidal ideas. The patient is nervous/anxious.     Objective:   Today's Vitals: BP 121/83 (BP Location: Right Arm, Patient Position: Sitting)   Pulse 76   Wt 159 lb 3.2 oz (72.2 kg)   SpO2 100%   BMI 29.12 kg/m   Physical Exam Vitals and nursing note reviewed.  Constitutional:      Appearance: Normal appearance.  Neck:  Vascular: No carotid bruit.  Cardiovascular:     Rate and Rhythm: Normal rate and regular rhythm.  Pulmonary:     Effort: Pulmonary effort is normal.     Breath sounds: Normal breath sounds. No wheezing or rhonchi.  Abdominal:     Palpations: Abdomen is soft.     Tenderness: There is no abdominal tenderness. There is no right CVA tenderness, left CVA tenderness, guarding or rebound.  Musculoskeletal:        General: No tenderness.     Cervical back: Normal range of motion and neck supple. No rigidity or tenderness.     Right lower leg: No edema.     Left lower leg: No edema.  Lymphadenopathy:     Cervical: No cervical adenopathy.  Skin:    General: Skin is warm and dry.     Findings: No rash.  Neurological:     General: No focal deficit present.     Mental Status: She is alert and oriented to person, place, and time.  Psychiatric:     Comments: Patient with rapid speech.  She appears to be slightly anxious.     Assessment & Plan:  1. Allergic reaction, initial encounter Notes from patient 07/25/2020 emergency department visit regarding allergic reaction which was thought to be secondary to patient having recently eaten shrimp was reviewed.  Patient reports that she does have an EpiPen.  She reports however that she recently ate shrimp for several days in a row and did not have an allergic reaction.  She reports that she had an incident in May in which she had a reaction to  hair dye which she believes started at her issues with allergic reactions.  Patient will be referred to an allergist for further evaluation and treatment. - Ambulatory referral to Allergy  2. Dizziness Patient with complaint of recurrent episodes of dizziness.  She will have CBC to look for anemia and basic metabolic panel to look for electrolyte abnormality. - CBC with Differential - Basic Metabolic Panel  3. Urinary frequency She reports issues with urinary frequency and will have urinalysis to look for urinary tract infection, hemoglobin A1c to look for possible elevated blood sugar/diabetes as well as glucose level as part of BMP.  Urine will also be sent for culture. - POCT URINALYSIS DIP (CLINITEK) - Hemoglobin A1c - Urine Culture  4. Changes in vision She reports changes in vision as well as blurred vision since May of this year and she believes that she may have gotten substances into her eyes when she had her hair dyed.  She will be referred to ophthalmology for further evaluation and treatment.  Blood sugar levels over the past 90 days will be checked as part of hemoglobin A1c to determine if patient may have prediabetes or diabetes which can also cause issues with blurred vision. - Ambulatory referral to Ophthalmology  5. Abnormal urinalysis Urinalysis with trace lysed blood.  Urine will be sent for culture and she will be notified if antibiotic therapy is needed based on the culture results. - Urine Culture  6. Language barrier She was accompanied by an in person Spanish interpreter supplied by [employer] to help with language barrier at today's visit.   Outpatient Encounter Medications as of 11/09/2020  Medication Sig  . EPINEPHrine (EPIPEN 2-PAK) 0.3 mg/0.3 mL IJ SOAJ injection Inject 0.3 mLs (0.3 mg total) into the muscle once as needed for up to 1 dose (for severe allergic reaction). CAll 911 immediately if you  have to use this medicine  . naproxen (NAPROSYN) 375 MG  tablet Take 1 tablet (375 mg total) by mouth 2 (two) times daily.  . [DISCONTINUED] citalopram (CELEXA) 10 MG tablet Take 1 tablet (10 mg total) by mouth daily. For mood   No facility-administered encounter medications on file as of 11/09/2020.     Follow-up: Return in about 4 weeks (around 12/07/2020).   Cain Saupe, MD

## 2020-11-09 NOTE — Progress Notes (Signed)
WANTS ALLERGY PANEL COMPLETED HAVING ALLERGIC REACTIONS TO DIFFERENT FOODS RECENTLY

## 2020-11-10 LAB — CBC WITH DIFFERENTIAL/PLATELET
Basophils Absolute: 0.1 x10E3/uL (ref 0.0–0.2)
Basos: 1 %
EOS (ABSOLUTE): 0.2 x10E3/uL (ref 0.0–0.4)
Eos: 2 %
Hematocrit: 46 % (ref 34.0–46.6)
Hemoglobin: 15 g/dL (ref 11.1–15.9)
Immature Grans (Abs): 0 x10E3/uL (ref 0.0–0.1)
Immature Granulocytes: 0 %
Lymphocytes Absolute: 4.5 x10E3/uL — ABNORMAL HIGH (ref 0.7–3.1)
Lymphs: 39 %
MCH: 30 pg (ref 26.6–33.0)
MCHC: 32.6 g/dL (ref 31.5–35.7)
MCV: 92 fL (ref 79–97)
Monocytes Absolute: 0.7 x10E3/uL (ref 0.1–0.9)
Monocytes: 6 %
Neutrophils Absolute: 6 x10E3/uL (ref 1.4–7.0)
Neutrophils: 52 %
Platelets: 363 x10E3/uL (ref 150–450)
RBC: 5 x10E6/uL (ref 3.77–5.28)
RDW: 11.9 % (ref 11.7–15.4)
WBC: 11.4 x10E3/uL — ABNORMAL HIGH (ref 3.4–10.8)

## 2020-11-10 LAB — BASIC METABOLIC PANEL WITH GFR
BUN/Creatinine Ratio: 24 — ABNORMAL HIGH (ref 9–23)
BUN: 19 mg/dL (ref 6–20)
CO2: 24 mmol/L (ref 20–29)
Calcium: 10.2 mg/dL (ref 8.7–10.2)
Chloride: 104 mmol/L (ref 96–106)
Creatinine, Ser: 0.79 mg/dL (ref 0.57–1.00)
GFR calc Af Amer: 113 mL/min/1.73
GFR calc non Af Amer: 98 mL/min/1.73
Glucose: 95 mg/dL (ref 65–99)
Potassium: 4.6 mmol/L (ref 3.5–5.2)
Sodium: 142 mmol/L (ref 134–144)

## 2020-11-10 LAB — HEMOGLOBIN A1C
Est. average glucose Bld gHb Est-mCnc: 126 mg/dL
Hgb A1c MFr Bld: 6 % — ABNORMAL HIGH (ref 4.8–5.6)

## 2020-11-16 ENCOUNTER — Telehealth: Payer: Self-pay

## 2020-11-16 NOTE — Telephone Encounter (Addendum)
Using Lyn Hollingshead, [facility] Agent as interpreter, patient given results as noted by Dr. Jillyn Hidden on 11/10/20, patient verbalized understanding. Advised to schedule a f/u as noted, Kristen Frank will schedule. Patient is requesting a return to work note, CRM will be sent by [place] to office. Advised once urine culture results are resulted she will receive a call.    Cammie Fulp, MD  11/10/2020 2:07 PM EST     Mild increase in white blood cell count at 11.4 otherwise normal CBC. White blood cell count can be increased with infections and patient urine culture is pending. Normal basic metabolic panel. Hemoglobin A1c of 6.0 which is consistent with the diagnosis of prediabetes or an increased risk of becoming diabetic. Please keep follow-up appointment to discuss.

## 2020-11-20 ENCOUNTER — Telehealth: Payer: Self-pay

## 2020-11-20 NOTE — Telephone Encounter (Signed)
Copied from CRM [id number]. Topic: General - Other >> Nov 16, 2020  1:01 PM Lyn Hollingshead D wrote: PT requesting a PHY letter to return to work, stating she has no issues or restrictions / please advise

## 2021-01-02 ENCOUNTER — Ambulatory Visit: Payer: Medicaid Other | Attending: Nurse Practitioner | Admitting: Nurse Practitioner

## 2021-01-02 ENCOUNTER — Other Ambulatory Visit: Payer: Self-pay

## 2021-01-02 ENCOUNTER — Encounter: Payer: Self-pay | Admitting: Nurse Practitioner

## 2021-01-02 DIAGNOSIS — T7840XS Allergy, unspecified, sequela: Secondary | ICD-10-CM

## 2021-01-02 NOTE — Progress Notes (Signed)
Virtual Visit via Telephone Note Due to national recommendations of social distancing due to COVID 19, telehealth visit is felt to be most appropriate for this patient at this time.  I discussed the limitations, risks, security and privacy concerns of performing an evaluation and management service by telephone and the availability of in person appointments. I also discussed with the patient that there may be a patient responsible charge related to this service. The patient expressed understanding and agreed to proceed.    I connected with Kristen Frank on 01/02/21  at  11:10 AM EST  EDT by telephone and verified that I am speaking with the correct person using two identifiers.   Consent I discussed the limitations, risks, security and privacy concerns of performing an evaluation and management service by telephone and the availability of in person appointments. I also discussed with the patient that there may be a patient responsible charge related to this service. The patient expressed understanding and agreed to proceed.   Location of Patient: Private Residence   Location of Provider: [facility]    Persons participating in Telemedicine visit: Kristen Denver FNP-BC Kristen Frank CMA Kristen Frank  Spanish Interpreter Kristen [id number]    History of Present Illness: Telemedicine visit for: Establish Care  She is requesting a note from me that she can give to her attorney. She states she had her hair dyed last July and she experienced an allergic reaction which caused her hair to fall out for which she was evaluated in the ED. However the ED note from that visit does not indicate an allergic reaction to hair day.  Notes as reviewed on 07-25-2020: Kristen Frank is a 35 y.o. Frank who presents emergency department with chief complaint of allergic reaction.  Patient states that she has eaten shrimp in the past and developed hives.  She had some last night and  developed facial rash and itching over her arms.  She took Benadryl was able to get to sleep.  This morning she continues to have the rash.  She denies nausea vomiting, syncope, wheezing, swelling in her throat  There is an establish care note from her PCP 10-2020 which notes:  Kristen Frank, 35 year old Frank who presents to establish care.  She reports that she has been having recurrent allergic reaction since May of this year and she would like to find out what is causing her to have these reactions.  Patient reports that in May of this year, she went to a hairstylist to have her hair dyed white.  Patient reports that after having her hair dyed, she developed an allergic reaction causing blistering of her scalp and eventually causing her hair to fall out.  Since that time she has had an allergic reaction which caused her to have a rash on her arms and face along with swelling of her face.  She did not feel as if she was having any swelling/closing of her throat and no swelling of the mouth or tongue.  She reports that she was seen in the emergency room at the end of July secondary to this rash.  She had eaten shrimp the night before the emergency department visit as she was told that this might be the cause of her allergic reaction.  She reports however that last week she ate shrimp for several days in a row to see if this would cause her to have an allergic reaction but she did not experience any rash  or facial swelling after eating shrimp recently.    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  She states that she has been having issues with increased anxiety and depression symptoms since the incident in May.  In September of this year she had an episode of chest pain and left arm pain for which she went to the  emergency department.  She states that she was told that her chest pain and arm pain were related to anxiety.  She reports that she was prescribed Celexa by her doctor to help with anxiety however the medication made her feel funny and she stopped taking it.  She would like further testing to find out the cause of her allergy symptoms.  On review of systems, she reports that she has had issues with frontal headaches, dizziness and blurred vision.  She states that she believes that some of the product that was used to dye her hair back in May may have gotten into her eyes causing her to have changes in her vision.  She denies any issues with nasal congestion, no runny nose or postnasal drainage.  She has had no additional episodes of chest pain or palpitations.  She has felt at times as if she has had some joint pain and some swelling in her legs.  She reports that she has had some urinary frequency.  She denies any increased thirst.   I instructed Ms. Frank today that I am only able to give her a letter stating her subjective symptoms as she was never treated for an allergic reaction to hair dye nor is she taking any medications related to the symptoms that have been prescribed by a provider.  Past Medical History:  Diagnosis Date   History of depression    HSV-2 infection     Past Surgical History:  Procedure Laterality Date   No past surgery      Family History  Problem Relation Age of Onset   Asthma Brother    Heart disease Neg Hx     Social History   Socioeconomic History   Marital status: Single    Spouse name: Not on file   Number of children: Not on file   Years of education: Not on file   Highest education level: Not on file  Occupational History   Not on file  Tobacco Use   Smoking status: Never Smoker   Smokeless tobacco: Never Used  Substance and Sexual Activity   Alcohol use: No   Drug use: No   Sexual activity: Yes    Birth control/protection: None   Other Topics Concern   Not on file  Social History Narrative   Not on file   Social Determinants of Health   Financial Resource Strain: Not on file  Food Insecurity: Not on file  Transportation Needs: Not on file  Physical Activity: Not on file  Stress: Not on file  Social Connections: Not on file     Observations/Objective: Awake, alert and oriented x 3   Review of Systems  Constitutional: Negative for fever, malaise/fatigue and weight loss.  HENT: Negative.  Negative for nosebleeds.   Eyes: Negative.  Negative for blurred vision, double vision and photophobia.  Respiratory: Negative.  Negative for cough and shortness of breath.  Cardiovascular: Negative.  Negative for chest pain, palpitations and leg swelling.  Gastrointestinal: Negative.  Negative for heartburn, nausea and vomiting.  Musculoskeletal: Negative.  Negative for myalgias.  Neurological: Negative.  Negative for dizziness, focal weakness, seizures and headaches.  Endo/Heme/Allergies: Positive for environmental allergies.  Psychiatric/Behavioral: Negative.  Negative for suicidal ideas.    Assessment and Plan: [place] was seen today for request for note.  Diagnoses and all orders for this visit:  Allergic reaction, sequela Letter given to patient today.    Follow Up Instructions Return if symptoms worsen or fail to improve.     I discussed the assessment and treatment plan with the patient. The patient was provided an opportunity to ask questions and all were answered. The patient agreed with the plan and demonstrated an understanding of the instructions.   The patient was advised to call back or seek an in-person evaluation if the symptoms worsen or if the condition fails to improve as anticipated.  I provided 12 minutes of non-face-to-face time during this encounter including median intraservice time, reviewing previous notes, labs, imaging, medications and explaining diagnosis and management.  Claiborne Rigg, FNP-BC

## 2021-01-07 ENCOUNTER — Encounter: Payer: Self-pay | Admitting: Nurse Practitioner

## 2021-01-18 ENCOUNTER — Other Ambulatory Visit: Payer: Self-pay

## 2021-01-18 ENCOUNTER — Ambulatory Visit (INDEPENDENT_AMBULATORY_CARE_PROVIDER_SITE_OTHER): Payer: Medicaid Other | Admitting: Allergy & Immunology

## 2021-01-18 ENCOUNTER — Encounter: Payer: Self-pay | Admitting: Allergy & Immunology

## 2021-01-18 VITALS — BP 122/74 | HR 80 | Temp 97.9°F | Resp 18 | Ht 62.0 in | Wt 163.0 lb

## 2021-01-18 DIAGNOSIS — L500 Allergic urticaria: Secondary | ICD-10-CM

## 2021-01-18 DIAGNOSIS — K9049 Malabsorption due to intolerance, not elsewhere classified: Secondary | ICD-10-CM

## 2021-01-18 DIAGNOSIS — L239 Allergic contact dermatitis, unspecified cause: Secondary | ICD-10-CM | POA: Diagnosis not present

## 2021-01-18 NOTE — Progress Notes (Signed)
NEW PATIENT  Date of Service/Encounter:  01/18/21  Referring provider: Claiborne Rigg, NP   Assessment:   Allergic contact dermatitis - patch testing pending  Food intolerance - with negative testing to everything tested today with adequate controls  Weight gain - unlikely to be related to the reaction to the hair dye  Abdominal pain - consider GI referral in the future   Plan/Recommendations:*    1. Allergic contact dermatitis - patch testing pending - We can test for sensitivities to chemicals, dyes, metals etc next Monday. - This is done via patch testing, where stickers were placed on the back on a Monday and then read on Monday Wednesday and Friday. - If you can get a sample of the hair dye that was used on your hair in July 2021, that would be ideal so that we can test it directly. - I will write a letter once we know more about what the patch testing demonstrated.   2. Food intolerance - Testing was negative to all of the foods tested today. - Copy of test results provided. - There is a the low positive predictive value of food allergy testing and hence the high possibility of false positives. - In contrast, food allergy testing has a high negative predictive value, therefore if testing is negative we can be relatively assured that they are indeed negative.  - I do not have any idea why there would be a connection between your reaction to the hair dye in any gastrointestinal symptoms. - But what I can tell you is that you do not have any sensitivities or evidence of food allergies to the foods tested today.  3. Return in about 4 days (around 01/22/2021) for [facility] TESTING.     Subjective:   Kristen Frank is a 35 y.o. female presenting today for evaluation of  Chief Complaint  Patient presents with  . Allergic Reaction    July went to ED because her hair fell down. In ED she was told she was allergic to shrimp. Patient thinks that it was a new hair color that  she got due to her hair falling out. Wants to confirm it's not the shrimp. Wants a letter that states that it was the hair coloring dye that made her hair fall out. Says she has eaten shrimp before and after ED visit and she never had a reaction. Patient says she know it was the color chemicals that caused her hair to fall out due to being in for 6 hours    Kristen Frank has a history of the following: Patient Active Problem List   Diagnosis Date Noted  . MDD (major depressive disorder), single episode, severe , no psychosis (HCC) 01/18/2019  . Active labor at term 02/09/2016  . Labor and delivery, indication for care 03/08/2015  . Evaluate anatomy not seen on prior sonogram   . [redacted] weeks gestation of pregnancy   . Preeclampsia 04/20/2012    History obtained from: chart review and patient.  Kristen Frank was referred by Claiborne Rigg, NP.     Kristen Frank is a 35 y.o. female presenting for an evaluation of an allergic reaction. She reports that she was sent here to be "tested for allergies". She is unsure of the testing needed.   She reports that her hair fell out from the root. This occurred after going to a beauty salon. They bleached her hair until it was all white. This all occurred in July 2021.  She went to the ED for an allergic reaction in July 2021. That note said that she had hives. She reports that she had a swollen face and her hair. She reports that she had a lot of "bumps" in her hair. She tells me that her hair was all out when she went to the ED. There is not hair loss noted in the ED note. She decided to cut her hair very short AFTER the ED visit. The hair continued to fall out.    She wants to prove that she is not allergic to shrimp and she wants a letter saying that she was allergic to the hair dye, which made her hair fall out. The salon reported that hair falling out was "normal". She had hives during her ED visit as well. She received steroids in the ED. However, she  has continued to eat shrimp, even yesterday, and was fine. She does report a reaction to tomatoes at one point.   She was told that the bleaching needed to occur every three months. But the lady at the beauty salon wanted to do it in one fail swoop. Her hair has grown back, although it took some time. She did talk to the beauty salon about this and she was told that hair falling out was not necessarily because of anything done at the salon. She was told that she could do an entire new procedure on her hair without a charge. She never had anything else done by this salon. She does not know which chemical that they used.  She also wants a letter stating that the emergency that she had was not related to the shrimp, but the real reason was the hair bleaching. She also reports that "everything changed in her body" after the hair treatments. She reports that if she eats something, she "wants to throw up". She tells me that the food was not making her "well to [her] stomach". She is not really able to pin down a particular food.  She shows me a series of pictures where she has some weight gain and then some eventual weight loss.   Evidently she and the salon share customers.  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  The salon owner has told her customers that the patient has cancer and other chronic medical problems, which has decreased a lot of her business selling of the weight loss supplements.  The patient wants evidence that she does not have food allergies and she would like evidence that she reacted to the hair dye.  She is planning to sue the salon owner for libel. The patient is truly under the impression that the hair dye episode caused her to have GI issues.   Otherwise, there is no history of other atopic diseases, including asthma, drug allergies, environmental allergies, stinging insect allergies, eczema or urticaria. There is  no significant infectious history. Vaccinations are up to date.    Past Medical History: Patient Active Problem List   Diagnosis Date Noted  . MDD (major depressive disorder), single episode, severe , no psychosis (HCC) 01/18/2019  . Active labor at term 02/09/2016  . Labor and delivery, indication for care 03/08/2015  . Evaluate anatomy not seen on prior sonogram   . [redacted] weeks gestation of pregnancy   . Preeclampsia 04/20/2012    Medication List:  Allergies as of 01/18/2021      Reactions   Shrimp [  shellfish Allergy] Hives      Medication List       Accurate as of January 18, 2021  9:48 AM. If you have any questions, ask your nurse or doctor.        EPINEPHrine 0.3 mg/0.3 mL Soaj injection Commonly known as: EpiPen 2-Pak Inject 0.3 mLs (0.3 mg total) into the muscle once as needed for up to 1 dose (for severe allergic reaction). CAll 911 immediately if you have to use this medicine   naproxen 375 MG tablet Commonly known as: NAPROSYN Take 1 tablet (375 mg total) by mouth 2 (two) times daily.       Birth History: non-contributory  Developmental History: non-contributory  Past Surgical History: Past Surgical History:  Procedure Laterality Date  . No past surgery       Family History: Family History  Problem Relation Age of Onset  . Asthma Brother   . Heart disease Neg Hx      Social History: Nyajah lives at home with her family.  She lives in a house that is 35 years old.  There are no animals inside or outside of the home.  There is no tobacco exposure.  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   Review of Systems  Constitutional: Negative.  Negative for fever, malaise/fatigue and weight loss.  HENT: Negative.  Negative for congestion, ear discharge, ear pain and sore throat.   Eyes: Negative for pain, discharge and redness.  Respiratory: Negative for cough, sputum production, shortness of breath and wheezing.   Cardiovascular:  Negative.  Negative for chest pain and palpitations.  Gastrointestinal: Positive for abdominal pain and nausea. Negative for constipation, diarrhea, heartburn and vomiting.  Skin: Positive for rash. Negative for itching.  Neurological: Negative for dizziness and headaches.  Endo/Heme/Allergies: Negative for environmental allergies. Does not bruise/bleed easily.       Objective:   Blood pressure 122/74, pulse 80, temperature 97.9 F (36.6 C), resp. rate 18, height 5\' 2"  (1.575 m), weight 163 lb (73.9 kg), SpO2 100 %, unknown if currently breastfeeding. Body mass index is 29.81 kg/m.   Physical Exam: General:  alert, active, in no acute distress. Cooperative with the exam.  Head:  normocephalic, no masses, lesions, tenderness or abnormalities Eyes:  conjunctiva clear without injection or discharge, EOMI, PERL Ears:  TM's pearly white bilaterally, external auditory canals are clear, external ears are normally set and rotated Nose:  External nose within normal limits, normal appearing turbinates, no discharge, septum midline Throat:  moist mucous membranes without erythema, exudates or petechiae, no thrush Neck:  Supple without thyromegaly or adenopathy appreciated Lymphatic: no adenopathy appreciated in the cervical, posterior auricular, axillary, epitrochlear, inguinal, or popliteal regions Lungs:  clear to auscultation, no wheezing, crackles or rhonchi, breathing unlabored, moving air well in all lung fields Heart:  regular rate and rhythm, normal S1/S2, no murmurs or gallops, normal peripheral perfusion Abdomen:  Soft, non-tender, BS normal, no masses, no organomegaly Neuro:  Normal mental status, speech normal, alert and oriented x3 Musculoskeletal:  no cyanosis, clubbing or edema Skin:  skin color, texture and turgor are normal; no bruising, rashes or lesions noted. Psych: Normal Affect and mood   Diagnostic studies:    Allergy Studies: Negative to egg, shellfish mix, fish  mix, Fish, bass, Trout, tuna, salmon, flounder, cod, shrimp, crab, lobster, oyster, scallops, and tomato with adequate controls    Allergy testing results were read and interpreted by myself, documented by clinical  staff.         Kristen BondsJoel Nakeyia Menden, MD [facility]

## 2021-01-18 NOTE — Patient Instructions (Addendum)
1. Allergic contact dermatitis - patch testing pending - We can test for sensitivities to chemicals, dyes, metals etc next Monday. - This is done via patch testing, where stickers were placed on the back on a Monday and then read on Monday Wednesday and Friday. - If you can get a sample of the hair dye that was used on your hair in July 2021, that would be ideal so that we can test it directly.  2. Food intolerance - Testing was negative to all of the foods tested today. - Copy of test results provided. - There is a the low positive predictive value of food allergy testing and hence the high possibility of false positives. - In contrast, food allergy testing has a high negative predictive value, therefore if testing is negative we can be relatively assured that they are indeed negative.  - I do not have any idea why there would be a connection between your reaction to the hair dye in any gastrointestinal symptoms. - But what I can tell you is that you do not have any sensitivities or evidence of food allergies to the foods tested today.  3. Return in about 4 days (around 01/22/2021) for [facility] TESTING.    Please inform us of any Emergency Department visits, hospitalizations, or changes in symptoms. Call us before going to the ED for breathing or allergy symptoms since we might be able to fit you in for a sick visit. Feel free to contact us anytime with any questions, problems, or concerns.  It was a pleasure to meet you today!  Websites that have reliable patient information: 1. American Academy of Asthma, Allergy, and Immunology: www.aaaai.org 2. Food Allergy Research and Education (FARE): foodallergy.org 3. Mothers of Asthmatics: http://www.asthmacommunitynetwork.org 4. American College of Allergy, Asthma, and Immunology: www.acaai.org   COVID-19 Vaccine Information can be found at: PodExchange.nl For questions related to  vaccine distribution or appointments, please email [email or web address] or call [phone].     "Like" [place] on Facebook and Instagram for our latest updates!       Make sure you are registered to vote! If you have moved or changed any of your contact information, you will need to get this updated before voting!  In some cases, you MAY be able to register to vote online: [email or web address]

## 2021-01-22 ENCOUNTER — Ambulatory Visit: Payer: Medicaid Other | Admitting: Allergy

## 2021-01-22 ENCOUNTER — Encounter: Payer: Self-pay | Admitting: Allergy

## 2021-01-22 ENCOUNTER — Other Ambulatory Visit: Payer: Self-pay

## 2021-01-22 VITALS — BP 144/82 | HR 78 | Temp 98.5°F | Resp 18 | Ht 61.0 in | Wt 164.4 lb

## 2021-01-22 DIAGNOSIS — L2389 Allergic contact dermatitis due to other agents: Secondary | ICD-10-CM | POA: Insufficient documentation

## 2021-01-22 DIAGNOSIS — L239 Allergic contact dermatitis, unspecified cause: Secondary | ICD-10-CM

## 2021-01-22 NOTE — Progress Notes (Signed)
   Follow Up Note  RE: Kristen Frank MRN: [id number] DOB: 29-Jun-1986 Date of Office Visit: 01/22/2021  Referring provider: Claiborne Rigg, NP Primary care provider: Claiborne Rigg, NP  History of Present Illness: I had the pleasure of seeing Kristen Frank for a follow up visit at the [facility] on 01/22/2021. She is a 35 y.o. female, who is being followed for contact dermatitis and food intolerance. Today she is here for patch test placement, given suspected history of contact dermatitis. Spanish interpreter present in person.  Patient was unable to get a sample of the dye as the person who dyed her hair won't provide her with a sample. She did bring the hair that fell out with her - which was blonde.  She is still interested in pursuing patch testing today.    Patient wants me to check her scalp - she had issues with scabbing and oozing in July 2021 but now doing much better.  Patient also wants to know if she can get the police to make her stylist provide her with a sample of the hair dye.   Physical exam: Scalp looks normal without any rashes, scabbing.   Diagnostics: TRUE Test patches placed.   Assessment and Plan: Kristen Frank is a 35 y.o. female with: Allergic contact dermatitis . TRUE Patches placed today.  . Please avoid strenuous physical activities and do not get the patches on the back wet. No showering until final patch reading done. Molli Knock to take antihistamines for itching but avoid placing any creams on the back where the patches are. . We will remove the patches on Wednesday and will do our initial read. . Then you will come back on Friday for a final read. . Normal scalp today on exam. . Discussed with patient that I'm not sure what the process is if she involves the police to see if she can get a sample of the hair dye. She will find out.   It was my pleasure to see Kristen Frank today and participate in her care. Please feel free to contact me with any  questions or concerns.  Sincerely,  Wyline Mood, DO Allergy & Immunology  [facility] office: [phone] [facility] office: [phone] [place] office: [phone]

## 2021-01-22 NOTE — Assessment & Plan Note (Addendum)
.   TRUE Patches placed today.  . Please avoid strenuous physical activities and do not get the patches on the back wet. No showering until final patch reading done. Molli Knock to take antihistamines for itching but avoid placing any creams on the back where the patches are. . We will remove the patches on Wednesday and will do our initial read. . Then you will come back on Friday for a final read. . Normal scalp today on exam. . Discussed with patient that I'm not sure what the process is if she involves the police to see if she can get a sample of the hair dye. She will find out.

## 2021-01-22 NOTE — Patient Instructions (Signed)
Patches placed today. Please avoid strenuous physical activities and do not get the patches on the back wet. No showering until final patch reading done. Okay to take antihistamines for itching but avoid placing any creams on the back where the patches are. We will remove the patches on Wednesday and will do our initial read. Then you will come back on Friday for a final read. 

## 2021-01-23 NOTE — Progress Notes (Signed)
   Follow Up Note  RE: Morrison Masser MRN: [id number] DOB: [birth year] Date of Office Visit: 01/24/2021  Referring provider: Claiborne Rigg, NP Primary care provider: Claiborne Rigg, NP  History of Present Illness: I had the pleasure of seeing Adin Lariccia for a follow up visit at the [facility] on 01/24/2021. She is a 35 y.o. female, who is being followed for contact dermatitis and food intolerance. Today she is here for initial patch test interpretation, given suspected history of contact dermatitis. Spanish interpreter present in person.   Diagnostics:  TRUE TEST 48 hour reading:   T.R.U.E. Test - 01/24/21 0800    Time Antigen Placed 1059    Manufacturer Other   [employer]   Lot # [id number]    Location Back    Number of Test 36    Reading Interval Day 1;Day 3;Day 5    Panel Panel 1;Panel 2;Panel 3    1. Nickel Sulfate 2    2. Wool Alcohols 0    3. Neomycin Sulfate 0    4. Potassium Dichromate 0    5. Caine Mix 0    6. Fragrance Mix 0    7. Colophony 0    8. Paraben Mix 0    9. Negative Control 0    10. Balsam of [place] 0    11. Ethylenediamine Dihydrochloride 0    12. Cobalt Dichloride 0    13. p-tert Butylphenol Formaldehyde Resin 0    14. Epoxy Resin 0    15. Carba Mix 1    16.  Black Rubber Mix 0    17. Cl+ Me-Isothiazolinone 0    18. Quaternium-15 0    19. Methyldibromo Glutaronitrile 0    20. p-Phenylenediamine 0    21. Formaldehyde 0    22. Mercapto Mix 0    23. Thimerosal 0    24. Thiuram Mix 0    25. Diazolidinyl Urea 0    26. Quinoline Mix 0    27. Tixocortol-21-Pivalate 0    28. Gold Sodium Thiosulfate 1    29. Imidazolidinyl Urea 0    30. Budesonide 0    31. Hydrocortisone-17-Butyrate 0    32. Mercaptobenzothiazole 0    33. Bacitracin 0    34. Parthenolide 0    35. Disperse Blue 106 0    36. 2-Bromo-2-Nitropropane-1,3-diol 0            Assessment and Plan: Jadelin is a 35 y.o. female with: Allergic contact  dermatitis . TRUE Patches removed today. . 48 hour reading positive to Nickel, carba mix and gold.  Jovita Gamma handouts on these 3 items in Spanish.  . Return in 2 days for final read.    The patient has been provided detailed information regarding the substances she is sensitive to, as well as products containing the substances.  Meticulous avoidance of these substances is recommended.  Return in about 2 days (around 01/26/2021) for Patch reading.  It was my pleasure to see Jaleesa today and participate in her care. Please feel free to contact me with any questions or concerns.  Sincerely,  Wyline Mood, DO Allergy & Immunology  [facility] office: [phone] [facility] office: [phone] [place] office: [phone]

## 2021-01-24 ENCOUNTER — Ambulatory Visit: Payer: Medicaid Other | Admitting: Allergy

## 2021-01-24 ENCOUNTER — Other Ambulatory Visit: Payer: Self-pay

## 2021-01-24 ENCOUNTER — Encounter: Payer: Self-pay | Admitting: Allergy

## 2021-01-24 DIAGNOSIS — L239 Allergic contact dermatitis, unspecified cause: Secondary | ICD-10-CM

## 2021-01-24 NOTE — Assessment & Plan Note (Signed)
.   TRUE Patches removed today. . 48 hour reading positive to Nickel, carba mix and gold.  Kristen Frank handouts on these 3 items in Spanish.  . Return in 2 days for final read.

## 2021-01-24 NOTE — Patient Instructions (Signed)
Follow up in 2 days for final patch reading. You may shower but do NOT put directly anything on the back - NO body wash, NO lotion, NO cream, NO scrubbing.  Minimize contact with water.

## 2021-01-26 ENCOUNTER — Other Ambulatory Visit: Payer: Self-pay

## 2021-01-26 ENCOUNTER — Encounter: Payer: Self-pay | Admitting: Allergy

## 2021-01-26 ENCOUNTER — Ambulatory Visit (INDEPENDENT_AMBULATORY_CARE_PROVIDER_SITE_OTHER): Payer: Medicaid Other | Admitting: Allergy

## 2021-01-26 DIAGNOSIS — L2389 Allergic contact dermatitis due to other agents: Secondary | ICD-10-CM | POA: Diagnosis not present

## 2021-01-26 NOTE — Progress Notes (Signed)
    Follow-up Note  RE: Kristen Frank MRN: [id number] DOB: October 04, 1986 Date of Office Visit: 01/26/2021  Primary care provider: Claiborne Rigg, NP Referring provider: Claiborne Rigg, NP   [name] returns to the office today for the initial patch test interpretation, given suspected history of contact dermatitis.    Diagnostics:  TRUE TEST 96 hour reading: #1 Nickel sulfate 2+ erythematous papules; #28 Gold soidum thiosulfate 1+ with erythema.   Plan:  Allergic contact dermatitis  The patient has been provided detailed information regarding the substances she is sensitive to, as well as products containing the substances.  Meticulous avoidance of these substances is recommended. If avoidance is not possible, the use of barrier creams or lotions is recommended.  Margo Aye, MD [facility]

## 2021-02-08 DIAGNOSIS — H5203 Hypermetropia, bilateral: Secondary | ICD-10-CM | POA: Diagnosis not present

## 2021-02-11 DIAGNOSIS — H5213 Myopia, bilateral: Secondary | ICD-10-CM | POA: Diagnosis not present

## 2021-02-13 ENCOUNTER — Telehealth: Payer: Self-pay | Admitting: Nurse Practitioner

## 2021-02-13 NOTE — Telephone Encounter (Signed)
Patient calling to request  A letter or note that need to say that she is allergic is for court purpose   To Nickel sulfate  erythematous papules;  Gold soidum thiosulfate with erythema. she said that his allergy place the office of Dr Lucie Leather told her to contact her pcp to do it

## 2021-02-18 NOTE — Telephone Encounter (Signed)
What does she need a  letter for court for?

## 2021-02-21 NOTE — Telephone Encounter (Signed)
Patient called saying she would like to know if her PCP can prescribe medication for her allergies. Patient states that the medication she was prescribed does not help her and would like to know if their is an alternative.    Patient also states she needs the letter because she was told she was allergic to shrimp at the ED and she is not. Please f/u

## 2021-02-23 ENCOUNTER — Encounter: Payer: Self-pay | Admitting: Nurse Practitioner

## 2021-02-23 NOTE — Telephone Encounter (Signed)
Spoke to patient and informed the letter is ready for pick up. Patient understood with [place] interpreter assist w/ the call.

## 2021-02-23 NOTE — Telephone Encounter (Signed)
She can pick up letter.
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
## 2021-02-26 DIAGNOSIS — H5203 Hypermetropia, bilateral: Secondary | ICD-10-CM | POA: Diagnosis not present

## 2021-02-26 DIAGNOSIS — H1013 Acute atopic conjunctivitis, bilateral: Secondary | ICD-10-CM | POA: Diagnosis not present

## 2021-03-02 ENCOUNTER — Ambulatory Visit: Payer: Medicaid Other | Attending: Internal Medicine | Admitting: Internal Medicine

## 2021-03-02 ENCOUNTER — Other Ambulatory Visit: Payer: Self-pay

## 2021-03-02 DIAGNOSIS — R413 Other amnesia: Secondary | ICD-10-CM | POA: Diagnosis not present

## 2021-03-02 NOTE — Progress Notes (Signed)
Virtual Visit via Telephone Note  I connected with Kristen Frank on 03/02/21 at  9:50 AM EST by telephone and verified that I am speaking with the correct person using two identifiers.  Location: Patient: home Provider: office The pt, my CMA Julius Bowels myself and [name] [phone] from [employer] participated I discussed the limitations, risks, security and privacy concerns of performing an evaluation and management service by telephone and the availability of in person appointments. I also discussed with the patient that there may be a patient responsible charge related to this service. The patient expressed understanding and agreed to proceed.   History of Present Illness: PCP is NP [name].   This is an urgent care appointment.. Pt wants referral to neurology.  C/o declining memory and headaches since 07/25/2020.  This was today she was seen in the emergency room for an allergic type reaction that was thought to be due to shrimp allergy.  However patient did not feel it was due to shrimp.  She thinks it was an ongoing reaction to something else. .  Since then she has found herself to be more forgetful.  She was studying music with a  Buyer, retail but no longer remembers the notes.  Sometimes when she is driving, she has to pull over because she does not remember where she was going.  Not working any more.  Loss job due to this situation.  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     Observations/Objective: No direct observation done as this was a telephone encounter. Patient very talkative and goes off on tangents.  She had to be redirected several times.  Assessment and Plan: 1. Memory changes - Ambulatory referral to Neurology   Follow Up Instructions: PRN   I discussed the assessment and treatment plan with the patient. The patient was provided an opportunity to ask questions and all were answered. The patient agreed with the plan and  demonstrated an understanding of the instructions.   The patient was advised to call back or seek an in-person evaluation if the symptoms worsen or if the condition fails to improve as anticipated.  I provided 13 minutes of non-face-to-face time during this encounter.   Kristen Blue, MD

## 2021-03-02 NOTE — Progress Notes (Signed)
Pt is wanting a neurology referral for headaches

## 2021-03-07 ENCOUNTER — Encounter: Payer: Self-pay | Admitting: Family Medicine

## 2021-03-07 ENCOUNTER — Other Ambulatory Visit: Payer: Self-pay

## 2021-03-07 ENCOUNTER — Ambulatory Visit (INDEPENDENT_AMBULATORY_CARE_PROVIDER_SITE_OTHER): Payer: Medicaid Other | Admitting: Family Medicine

## 2021-03-07 VITALS — BP 130/86 | HR 77 | Temp 96.6°F | Resp 16 | Ht 61.0 in | Wt 163.4 lb

## 2021-03-07 DIAGNOSIS — L2084 Intrinsic (allergic) eczema: Secondary | ICD-10-CM | POA: Diagnosis not present

## 2021-03-07 DIAGNOSIS — L2389 Allergic contact dermatitis due to other agents: Secondary | ICD-10-CM

## 2021-03-07 DIAGNOSIS — K9049 Malabsorption due to intolerance, not elsewhere classified: Secondary | ICD-10-CM

## 2021-03-07 DIAGNOSIS — L501 Idiopathic urticaria: Secondary | ICD-10-CM

## 2021-03-07 MED ORDER — FAMOTIDINE 20 MG PO TABS: 20.0000 mg | ORAL_TABLET | Freq: Two times a day (BID) | ORAL | 2 refills | Status: DC

## 2021-03-07 MED ORDER — EPINEPHRINE 0.3 MG/0.3ML IJ SOAJ: 0.3000 mg | Freq: Once | INTRAMUSCULAR | 1 refills | Status: DC | PRN

## 2021-03-07 MED ORDER — CETIRIZINE HCL 10 MG PO TABS: 10.0000 mg | ORAL_TABLET | Freq: Two times a day (BID) | ORAL | 2 refills | Status: DC

## 2021-03-07 NOTE — Patient Instructions (Addendum)
Hives (urticaria) Use the lowest amount of medication while remaining hive free . Cetirizine (Zyrtec) 10mg  twice a day and famotidine (Pepcid) 20 mg twice a day. If no symptoms for 7-14 days then decrease to. . Cetirizine (Zyrtec) 10mg  twice a day and famotidine (Pepcid) 20 mg once a day.  If no symptoms for 7-14 days then decrease to. . Cetirizine (Zyrtec) 10mg  twice a day.  If no symptoms for 7-14 days then decrease to. . Cetirizine (Zyrtec) 10mg  once a day.  May use Benadryl (diphenhydramine) as needed for breakthrough hives       If symptoms return, then step up dosage  Keep a detailed symptom journal including foods eaten, contact with allergens, medications taken, weather changes.  Suggest a referral for evaluation and treatment from dermatology specialty.   Atopic dermatitis Begin a daily moisturizing routine Begin Eucrisa twice a day as needed to red, itchy areas on your hands  Allergic contact dermatitis due to metals Continue to avoid nickel and gold  Food intolerance Recommend gastrointestinal specialist referral  Allergic reaction  In case of an allergic reaction, take Benadryl 50 mg every 4 hours, and if life-threatening symptoms occur, inject with EpiPen 0.3 mg.  Call the clinic if this treatment plan is not working well for you  Follow up in 2 months or sooner if needed.

## 2021-03-07 NOTE — Telephone Encounter (Signed)
Please send a referral for gastrointestinal specialist

## 2021-03-07 NOTE — Progress Notes (Addendum)
[address] [place] [address] Dept: [phone]  FOLLOW UP NOTE  Patient ID: Kristen Frank, female    DOB: [birth year]  Age: 35 y.o. MRN: [id number] Date of Office Visit: 03/07/2021  Assessment  Chief Complaint: Urticaria (Had a allergic reaction to nickel and came for medication. No hives or swelling of eyes at this time. Has not been prescribed any medication by the pcp. She was prescribed medication for a shrimp allergy, but she had an infection on her scalp. ), Angioedema, and Allergic Reaction (When shopping has a reaction from touching fruits and when to the eye doctor for an exam she broke out in hives near eyes after looking into the machine )  HPI Kristen Frank is a 35 year old female who presents to the clinic for follow-up visit.  She was last seen in this clinic on 01/20/2021 for evaluation and patch testing and prior to that appointment she was seen on 01/18/2021 by Dr. Ernst Bowler for evaluation of atopic dermatitis and food intolerance.  She is accompanied by an interpreter who assists with language.  At today's visit, Charelle has a myriad of complaints and concerns most of which revolve around body swelling, body itching, and hives.  She does report this all started with pimple-like itchy bumps that appeared on her scalp in July 2021 after visiting her hairdresser.  At today's visit she reports that she is still experiencing these itchy areas occurring on her scalp about 3 times a week.  She said these are extremely itchy and resolve in about 3 days.  She reports that these occur when she steps into the sunlight.  She does not experience the symptoms if she stays inside.  She is not currently taking any medication for these lesions on her scalp.  She does report dry, and itchy, swelling on her fingers if she touches certain fruits or vegetables.  She does report that she was wearing gloves and the symptoms began to decrease.  She is not currently using any medications,  lotions, or creams on her hands or skin.  She does report a food intolerance with symptoms including vomiting after eating about 4 times a week.  She is currently trying to avoid all foods that contain nickel including red meat, chicken, fish, corn, bread.  She has not seen a gastrointestinal specialist at this time.  She denies throat tightening, shortness of breath, dizziness, or widespread hives that occur with the vomiting.  And lastly, she reports that when she gets overheated she begins to experience swelling occurring over her body, dizziness, change in her eyesight, and a feverish feeling.  She reports this only occurs during warm weather months and does not occur during the winter or cold weather months.  Her current medications are listed in the chart.   Drug Allergies:  Allergies  Allergen Reactions  . 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  Constitutional:      Appearance: Normal appearance.  HENT:     Head: Normocephalic and atraumatic.     Right Ear: Tympanic membrane normal.     Left Ear: Tympanic membrane normal.     Nose:     Comments: Bilateral nares normal.  Pharynx normal.  Ears normal.  Eyes normal.    Mouth/Throat:     Pharynx: Oropharynx is clear.  Eyes:     Conjunctiva/sclera: Conjunctivae normal.  Cardiovascular:     Rate and Rhythm: Normal rate and regular rhythm.     Heart sounds: Normal heart sounds. No murmur heard.   Pulmonary:     Effort: Pulmonary effort is normal.     Breath sounds: Normal breath sounds.     Comments: Lungs clear to auscultation Musculoskeletal:        General: Normal range of motion.     Cervical back: Normal range of motion and neck supple.  Skin:    General: Skin is warm and dry.     Comments: Fingers with dry flaking areas at the end  of each digit.  No hives or other rash noted  Neurological:     Mental Status: She is alert and oriented to person, place, and time.  Psychiatric:        Mood and Affect: Mood normal.        Behavior: Behavior normal.        Thought Content: Thought content normal.        Judgment: Judgment normal.    Assessment and Plan: 1. Allergic contact dermatitis due to other agents   2. Food intolerance   3. Idiopathic urticaria   4. Intrinsic atopic dermatitis     Meds ordered this encounter  Medications  . EPINEPHrine (EPIPEN 2-PAK) 0.3 mg/0.3 mL IJ SOAJ injection    Sig: Inject 0.3 mg into the muscle once as needed for up to 1 dose (for severe allergic reaction). CAll 911 immediately if you have to use this medicine    Dispense:  1 each    Refill:  1  . famotidine (PEPCID) 20 MG tablet    Sig: Take 1 tablet (20 mg total) by mouth 2 (two) times daily.    Dispense:  60 tablet    Refill:  2  . cetirizine (ZYRTEC) 10 MG tablet    Sig: Take 1 tablet (10 mg total) by mouth 2 (two) times daily.    Dispense:  60 tablet    Refill:  2    Patient Instructions  Hives (urticaria) Use the lowest amount of medication while remaining hive free . Cetirizine (Zyrtec) 16m twice a day and famotidine (Pepcid) 20 mg twice a day. If no symptoms for 7-14 days then decrease to. . Cetirizine (Zyrtec) 169mtwice a day and famotidine (Pepcid) 20 mg once a day.  If no symptoms for 7-14 days then decrease to. . Cetirizine (Zyrtec) 1017mwice a day.  If no symptoms for 7-14 days then decrease to. . Cetirizine (Zyrtec) 83m78mce a day.  May use Benadryl (diphenhydramine) as needed for breakthrough hives       If symptoms return, then step up dosage  Keep a detailed symptom journal including foods eaten, contact with allergens, medications taken, weather changes.  Suggest a referral for evaluation and treatment from dermatology specialty.   Atopic dermatitis Begin a daily moisturizing routine Begin Eucrisa  twice a day as needed to red, itchy areas on your hands  Allergic contact dermatitis due to metals Continue to avoid nickel and gold  Food intolerance Recommend gastrointestinal specialist referral  Allergic reaction  In case of an allergic reaction, take Benadryl 50 mg every 4 hours, and if life-threatening symptoms occur, inject with EpiPen 0.3 mg.  Call the clinic if this treatment plan is not  working well for you  Follow up in 2 months or sooner if needed.   Return in about 2 months (around 05/07/2021), or if symptoms worsen or fail to improve.    Thank you for the opportunity to care for this patient.  Please do not hesitate to contact me with questions.  Gareth Morgan, FNP [facility]:  I reviewed the Nurse Practitioner's note and agree with the documented findings and plan of care. We discussed the patient and developed a plan concurrently.   Prudy Feeler, MD [facility]

## 2021-03-08 ENCOUNTER — Telehealth: Payer: Self-pay | Admitting: Allergy & Immunology

## 2021-03-08 NOTE — Telephone Encounter (Signed)
I gave her a treatment plan for if she is experiencing hives anywhere on her body including her scalp. At this clinic, I am not able to tell if she has an infection on her scalp or the reason for hair loss. She had shrimp listed as a food allergy from when she went to the ED on 07/25/2020 after getting hives. She told the medical personal that she sometimes gets hives after eating shrimp. She did have negative skin testing while at our clinic. Is she eating shrimp on a regular basis? How many times a week? Is this causing any hives? We usually do blood testing and a food challenge before removing an allergen from the list-unless she is consuming shrimp with no adverse reactions on a regular basis. Thank you

## 2021-03-08 NOTE — Telephone Encounter (Signed)
Patient was seen yesterday by you. Please advice . Thank you

## 2021-03-08 NOTE — Telephone Encounter (Signed)
Patient states she was tested for shrimp and it was negative, but it still shows in her chart that she has a shrimp allergy. Patient would like this removed. Also, patient states she never got an answer as to why her hair was falling out. Patient states the ED told her she had an infection in her scalp that was not treated. Informed patient that she would have to contact the ED to get those records or talk to someone regarding that problem.  Please advise.

## 2021-03-09 DIAGNOSIS — Z3046 Encounter for surveillance of implantable subdermal contraceptive: Secondary | ICD-10-CM | POA: Diagnosis not present

## 2021-03-09 DIAGNOSIS — Z1388 Encounter for screening for disorder due to exposure to contaminants: Secondary | ICD-10-CM | POA: Diagnosis not present

## 2021-03-09 DIAGNOSIS — Z3009 Encounter for other general counseling and advice on contraception: Secondary | ICD-10-CM | POA: Diagnosis not present

## 2021-03-09 DIAGNOSIS — Z01419 Encounter for gynecological examination (general) (routine) without abnormal findings: Secondary | ICD-10-CM | POA: Diagnosis not present

## 2021-03-09 DIAGNOSIS — Z0389 Encounter for observation for other suspected diseases and conditions ruled out: Secondary | ICD-10-CM | POA: Diagnosis not present

## 2021-03-12 NOTE — Telephone Encounter (Signed)
I called patient back today. She states she has been eating shrimp on a daily basis without a reaction and states she does not know why she was diagnosed with a shrimp allergy.She states she was seen last year for a scalp infection and that she still wants to know why her hair is falling out. Patient would like referral to dermatology. Please advice.

## 2021-03-12 NOTE — Telephone Encounter (Signed)
Pt came into office to pick up paperwork that was left. Pt stated that she would like a note stating that she is not allergic to shrimp. I clarified to patient that she would need to be tested for the shrimp allergy per Anne's message. Pt then told me she already had been tested and it turned out negative. Pt then stated she would like the note stating she is not allergic to shrimp.   Pt would also like to be referred to a dermatologist.   Please advise.

## 2021-03-12 NOTE — Telephone Encounter (Signed)
Left voicemail for patient to return call.

## 2021-03-14 ENCOUNTER — Telehealth: Payer: Self-pay

## 2021-03-14 NOTE — Telephone Encounter (Signed)
Patient notified.  Verbalized understanding. 

## 2021-03-14 NOTE — Telephone Encounter (Signed)
I will write an amendment to the last note about the shrimp allergy.

## 2021-03-14 NOTE — Telephone Encounter (Signed)
-----   Message from Hetty Blend, FNP sent at 03/12/2021  5:06 PM EDT ----- Can you please let this patient know that with the type of insurance she has a referral to dermatology specialty needs to come from her primary care provider. I have amended the note that her PCP will get to suggest that she would benefit from a referral to derm. I will send out a new AVS to the patient's address as it is listed in her chart. Thank you

## 2021-03-14 NOTE — Telephone Encounter (Signed)
Patient was notified of this and verbalized understanding.

## 2021-03-15 ENCOUNTER — Telehealth: Payer: Self-pay

## 2021-03-15 ENCOUNTER — Telehealth: Payer: Self-pay | Admitting: Nurse Practitioner

## 2021-03-15 NOTE — Telephone Encounter (Signed)
Please contact pt and make her aware that she will need to contact neurology to see if they have a sooner appt for her.

## 2021-03-15 NOTE — Telephone Encounter (Signed)
Copied from CRM [id number]. Topic: General - Other >> Mar 15, 2021  1:28 PM Gwenlyn Fudge wrote: Reason for CRM: Pt called stating that she went to allergy and asthma specialist and that she is not able to have her chart corrected by them. She states that she is needing to have her allergies on file updated. She states that she is allergic to Nickel and gold sodium. She states that she is not allergic to shellfish. She is also requesting to have a referral to dermatologist due to the lumps, itching, and hair loss. She states that she would prefer to have the appt with neurology could be moved to something sooner as she is very concerned. Please advise.

## 2021-03-15 NOTE — Telephone Encounter (Signed)
I received a message requesting the patient be seen by [facility] for food intolerance. Referral has been placed to their office via Epic.   Contact [address] [place], [place]: [phone]   Patient speaks spanish, Shanda Bumps could you help me out with informing the patient of this referral being placed? She can give their office a call to get scheduled.    Thanks

## 2021-03-15 NOTE — Telephone Encounter (Signed)
I have added nickel to allergy list. Wasn't ale to add Gold Sodium. Will forward to provider to place referral to dermatology

## 2021-03-16 ENCOUNTER — Encounter: Payer: Self-pay | Admitting: Family Medicine

## 2021-03-16 ENCOUNTER — Telehealth: Payer: Self-pay | Admitting: *Deleted

## 2021-03-16 ENCOUNTER — Telehealth: Payer: Self-pay | Admitting: Nurse Practitioner

## 2021-03-16 ENCOUNTER — Other Ambulatory Visit: Payer: Self-pay | Admitting: *Deleted

## 2021-03-16 ENCOUNTER — Encounter: Payer: Self-pay | Admitting: Physician Assistant

## 2021-03-16 NOTE — Telephone Encounter (Signed)
Will forward to provider  

## 2021-03-16 NOTE — Telephone Encounter (Signed)
Patient was informed and verbalized understanding. She will give them a call to schedule an appointment

## 2021-03-16 NOTE — Telephone Encounter (Signed)
PA has been submitted through [facility] for EpiPen and is currently pending approval/denial.

## 2021-03-16 NOTE — Telephone Encounter (Signed)
Requesting a referral for Dermatology again.   Pt was told she was allergic to Shrimp but pt states she's not- she's only allergic to Nickel. Pt is working on obtaining a Letter Faxed to [facility] stating that. (Fax pending).  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 She's changed her diet and no longer using teflon related cooking products but patient still is struggling w/ reactions to certain foods.  Pt had eyes checked and says the machines used gave her bumps below her eyebrows. Pt states her Allergist mentioned the nickel is all over the body and she has many different reactions to what she touches on a daily basis.   Pt states she is afraid of the side effects the epinephrine brought. Pt states her abdomen looked like she was 8 months pregnant, it was swollen, felt hard, felt the need to urinate but had trouble with that. Pt has gained 18 lbs since.   Pt states she needs to work to pay for her bills but she was told at her job, " You can't work here if you're going to be carrying your Epi shot with you- because we don't want you to say you had a reaction or something happen to you while you're on the job." Pt is asking PCP for help to know what to do /how to handle this to provide for herself or if she can have a letter stating she cannot work with anything related to Nickel.   This happened due to hair dye reaction back on 07/25/2020 and since has suffered hair loss. Pt seeks PCP's help to know how to proceed or help her deal with his issue. Please advise and thank you.

## 2021-03-16 NOTE — Telephone Encounter (Signed)
PA has been submitted through [employer] for EpiPen and is currently pending approval/denial.  

## 2021-03-16 NOTE — Telephone Encounter (Signed)
Routing back

## 2021-03-17 NOTE — Telephone Encounter (Signed)
Needs office visit to discuss.

## 2021-03-17 NOTE — Telephone Encounter (Signed)
She already has a letter by me stating she is allergic to nickel that she can give her employer. If she needs more than that she will need an appt

## 2021-03-19 ENCOUNTER — Telehealth: Payer: Self-pay | Admitting: Nurse Practitioner

## 2021-03-19 NOTE — Telephone Encounter (Signed)
PT [facility] in [facility] from [facility] stating she is not allergic to shrimp. Pt requested to have PCP sign this document to show that PCP is aware so this could be added to her chart. Pt requested to be notified when it's ready for her to pick up.   Please advise and thank you.

## 2021-03-19 NOTE — Telephone Encounter (Signed)
Will forward to provider  

## 2021-03-19 NOTE — Telephone Encounter (Signed)
Allergy to shrimp has been removed from chart per patient request

## 2021-03-19 NOTE — Telephone Encounter (Signed)
PA was approved. Approval has been faxed to pharmacy, labeled, and placed in bulk scanning.

## 2021-03-19 NOTE — Telephone Encounter (Signed)
Please contact pt and scheduled an appt to discuss dermatology referral

## 2021-03-20 NOTE — Telephone Encounter (Signed)
Copied from CRM [id number]. Topic: General - Call Back - No Documentation >> Mar 20, 2021 11:17 AM Randol Kern wrote: Reason for CRM: Pt needs a call back from office (spanish speaking) regarding returning to work.   Best contact: [phone]

## 2021-03-21 ENCOUNTER — Encounter: Payer: Self-pay | Admitting: Nurse Practitioner

## 2021-03-21 ENCOUNTER — Other Ambulatory Visit: Payer: Self-pay

## 2021-03-21 ENCOUNTER — Ambulatory Visit: Payer: Medicaid Other | Attending: Nurse Practitioner | Admitting: Nurse Practitioner

## 2021-03-21 DIAGNOSIS — L659 Nonscarring hair loss, unspecified: Secondary | ICD-10-CM

## 2021-03-21 NOTE — Progress Notes (Signed)
Virtual Visit via Telephone Note Due to national recommendations of social distancing due to COVID 19, telehealth visit is felt to be most appropriate for this patient at this time.  I discussed the limitations, risks, security and privacy concerns of performing an evaluation and management service by telephone and the availability of in person appointments. I also discussed with the patient that there may be a patient responsible charge related to this service. The patient expressed understanding and agreed to proceed.    I connected with Kristen Frank on 03/21/21  at  10:30 AM EDT  EDT by telephone and verified that I am speaking with the correct person using two identifiers.   Consent I discussed the limitations, risks, security and privacy concerns of performing an evaluation and management service by telephone and the availability of in person appointments. I also discussed with the patient that there may be a patient responsible charge related to this service. The patient expressed understanding and agreed to proceed.   Location of Patient: Private  Residence   Location of Provider: [facility]    Persons participating in Telemedicine visit: Bertram Denver FNP-BC YY Leonardo CMA [name] Leola Brazil    History of Present Illness: Telemedicine visit for: Requesting Dermatology referral  She is requesting a referral to dermatology with the following symptoms: itchy scalp,  hair thinning and "bumps in her scalp". States she has not been able to find a shampoo to help with her scalp itching or thinning hair. She believes these symptoms are related to when her hair was dyed last year around May. She was seen in the emergency room July 25 2020 but did not mention her hair being dyed at that time and felt as if her symptoms were related to eating shrimp. Since then she has been to the allergist and diagnosed with a nickel allergy. She believes the hair dye that was put  in her hair in May had nickel products in it and has caused all the symptoms she is currently experiencing. She also notes depression and anxiety since her hair started thinning.     Past Medical History:  Diagnosis Date  . History of depression   . HSV-2 infection     Past Surgical History:  Procedure Laterality Date  . No past surgery      Family History  Problem Relation Age of Onset  . Heart disease Neg Hx     Social History   Socioeconomic History  . Marital status: Single    Spouse name: Not on file  . Number of children: Not on file  . Years of education: Not on file  . Highest education level: Not on file  Occupational History  . Not on file  Tobacco Use  . Smoking status: Never Smoker  . Smokeless tobacco: Never Used  Substance and Sexual Activity  . Alcohol use: No  . Drug use: No  . Sexual activity: Yes    Birth control/protection: None  Other Topics Concern  . Not on file  Social History Narrative  . Not on file   Social Determinants of Health   Financial Resource Strain: Not on file  Food Insecurity: Not on file  Transportation Needs: Not on file  Physical Activity: Not on file  Stress: Not on file  Social Connections: Not on file     Observations/Objective: Awake, alert and oriented x 3   Review of Systems  Constitutional: Negative for fever, malaise/fatigue and weight loss.  SEE HPI  HENT: Negative.  Negative for nosebleeds.   Eyes: Negative.  Negative for blurred vision, double vision and photophobia.  Respiratory: Negative.  Negative for cough and shortness of breath.   Cardiovascular: Negative.  Negative for chest pain, palpitations and leg swelling.  Gastrointestinal: Negative.  Negative for heartburn, nausea and vomiting.  Musculoskeletal: Negative.  Negative for myalgias.  Neurological: Negative.  Negative for dizziness, focal weakness, seizures and headaches.  Psychiatric/Behavioral: Positive for depression. Negative for  suicidal ideas. The patient is nervous/anxious.     Assessment and Plan: Kristen Frank was seen today for referral.  Diagnoses and all orders for this visit:  Hair thinning -     Ambulatory referral to Dermatology     Follow Up Instructions Return if symptoms worsen or fail to improve.     I discussed the assessment and treatment plan with the patient. The patient was provided an opportunity to ask questions and all were answered. The patient agreed with the plan and demonstrated an understanding of the instructions.   The patient was advised to call back or seek an in-person evaluation if the symptoms worsen or if the condition fails to improve as anticipated.  I provided 14 minutes of non-face-to-face time during this encounter including median intraservice time, reviewing previous notes, labs, imaging, medications and explaining diagnosis and management.  Claiborne Rigg, FNP-BC

## 2021-03-21 NOTE — Progress Notes (Signed)
Pt states she is wanting to see dermatology and she is wanting to see them scalp infection

## 2021-04-03 ENCOUNTER — Encounter: Payer: Self-pay | Admitting: Physician Assistant

## 2021-04-03 ENCOUNTER — Other Ambulatory Visit: Payer: Self-pay

## 2021-04-03 ENCOUNTER — Telehealth: Payer: Self-pay | Admitting: Physician Assistant

## 2021-04-03 ENCOUNTER — Ambulatory Visit: Payer: Medicaid Other | Admitting: Physician Assistant

## 2021-04-03 VITALS — BP 110/78 | HR 76 | Ht 61.0 in | Wt 170.0 lb

## 2021-04-03 DIAGNOSIS — R112 Nausea with vomiting, unspecified: Secondary | ICD-10-CM

## 2021-04-03 DIAGNOSIS — R140 Abdominal distension (gaseous): Secondary | ICD-10-CM | POA: Diagnosis not present

## 2021-04-03 DIAGNOSIS — K219 Gastro-esophageal reflux disease without esophagitis: Secondary | ICD-10-CM | POA: Diagnosis not present

## 2021-04-03 MED ORDER — OMEPRAZOLE 20 MG PO CPDR: 20.0000 mg | DELAYED_RELEASE_CAPSULE | Freq: Two times a day (BID) | ORAL | 3 refills | Status: DC

## 2021-04-03 NOTE — Telephone Encounter (Signed)
Called patient via interpreter and I let her know that it was okay for her to take her omeprazole. Patient stated she didn't have anymore questions.

## 2021-04-03 NOTE — Telephone Encounter (Signed)
Inbound call from patient stating when she got home she saw the last medications she took; she took hydroxyzine and methylprednisolone, not Naproxen.  Wants to know if she should still take the omeprazole or what should she do.  Please advise.

## 2021-04-03 NOTE — Progress Notes (Signed)
Chief Complaint: Bloating, GERD, nausea and vomiting  HPI:    Kristen Frank is a 35 year old Spanish-speaking female, who was referred to me by Claiborne Rigg, NP for a complaint of bloating, nausea and vomiting, GERD.     Today, the patient presents to clinic accompanied by an interpreter.  She explains that on July 27 she developed an "infection on her head", she thinks this was related to hair dye and her health hair fell out.  She was seen in the ER and they thought she was having an allergic reaction.  She was put on Naproxen 375 mg twice daily for a week and also given Epinephrine pen and some cortisone.  She did have to use the Epinephrine pen about a week later due to a reaction she was experiencing.  Ever since then the patient tells me she has had trouble with bloating, and an "acidic feeling in my stomach", and nausea and vomiting.  Most recently she has followed with an allergist and was found to be allergic to "nickel and gold", apparently she was cooking foods on some surfaces that have this and when she would eat them she would vomit.  If she avoids cooking food on those surfaces she can avoid vomiting, but continues with her bloating and feeling of "acid on my stomach".  Tells me this occurs when she eats anything even if it is just an orange in the morning.    Patient also asked to be referred to a nutritionist given all of her new allergies tells me it is hard to try and find foods that she can eat.    Denies fever, chills, blood in her stool, change in bowel habits or symptoms that awaken her from sleep.     Past Medical History:  Diagnosis Date  . History of depression   . HSV-2 infection     Past Surgical History:  Procedure Laterality Date  . No past surgery      Current Outpatient Medications  Medication Sig Dispense Refill  . cetirizine (ZYRTEC) 10 MG tablet Take 1 tablet (10 mg total) by mouth 2 (two) times daily. 60 tablet 2  . EPINEPHrine (EPIPEN 2-PAK) 0.3 mg/0.3  mL IJ SOAJ injection Inject 0.3 mg into the muscle once as needed for up to 1 dose (for severe allergic reaction). CAll 911 immediately if you have to use this medicine 1 each 1  . famotidine (PEPCID) 20 MG tablet Take 1 tablet (20 mg total) by mouth 2 (two) times daily. 60 tablet 2  . naproxen (NAPROSYN) 375 MG tablet Take 1 tablet (375 mg total) by mouth 2 (two) times daily. 20 tablet 0   No current facility-administered medications for this visit.    Allergies as of 04/03/2021 - Review Complete 03/21/2021  Allergen Reaction Noted  . Nickel  03/15/2021    Family History  Problem Relation Age of Onset  . Heart disease Neg Hx     Social History   Socioeconomic History  . Marital status: Single    Spouse name: Not on file  . Number of children: Not on file  . Years of education: Not on file  . Highest education level: Not on file  Occupational History  . Not on file  Tobacco Use  . Smoking status: Never Smoker  . Smokeless tobacco: Never Used  Substance and Sexual Activity  . Alcohol use: No  . Drug use: No  . Sexual activity: Yes    Birth control/protection: None  Other Topics Concern  . Not on file  Social History Narrative  . Not on file   Social Determinants of Health   Financial Resource Strain: Not on file  Food Insecurity: Not on file  Transportation Needs: Not on file  Physical Activity: Not on file  Stress: Not on file  Social Connections: Not on file  Intimate Partner Violence: Not on file    Review of Systems:    Constitutional: No weight loss, fever or chills Skin: No rash  Cardiovascular: No chest pain Respiratory: No SOB  Gastrointestinal: See HPI and otherwise negative Genitourinary: No dysuria  Neurological: No headache, dizziness or syncope Musculoskeletal: No new muscle or joint pain Hematologic: No bleeding  Psychiatric: No history of depression or anxiety   Physical Exam:  Vital signs: BP 110/78   Pulse 76   Ht 5\' 1"  (1.549 m)    Wt 170 lb (77.1 kg)   BMI 32.12 kg/m   Constitutional:   Pleasant Hispanic female appears to be in NAD, Well developed, Well nourished, alert and cooperative Head:  Normocephalic and atraumatic. Eyes:   PEERL, EOMI. No icterus. Conjunctiva pink. Ears:  Normal auditory acuity. Neck:  Supple Throat: Oral cavity and pharynx without inflammation, swelling or lesion.  Respiratory: Respirations even and unlabored. Lungs clear to auscultation bilaterally.   No wheezes, crackles, or rhonchi.  Cardiovascular: Normal S1, S2. No MRG. Regular rate and rhythm. No peripheral edema, cyanosis or pallor.  Gastrointestinal:  Soft, nondistended, nontender. No rebound or guarding. Normal bowel sounds. No appreciable masses or hepatomegaly. Rectal:  Not performed.  Msk:  Symmetrical without gross deformities. Without edema, no deformity or joint abnormality.  Neurologic:  Alert and  oriented x4;  grossly normal neurologically.  Skin:   Dry and intact without significant lesions or rashes. Psychiatric:  Demonstrates good judgement and reason without abnormal affect or behaviors.  No recent labs or imaging.  Assessment: 1.  Bloating: After eating just about anything with a "acid feeling" in her stomach, started after using a high dose of Naproxen and taking an epinephrine injection; consider gastritis+/-relation allergies 2.  Nausea/vomiting: Better if she avoids eating food soft surfaces she is allergic to 3.  GERD: With bloating  Plan: 1.  Discussed with patient that possibly being on such a high strength of Naproxen caused some inflammation in her stomach which is still irritated and giving her this feeling of bloating and "acid". 2.  Started the patient on Omeprazole 20 mg twice daily, 30-60 minutes before breakfast and dinner.  Prescribed #60 with 3 refills. 3.  Referred the patient to a nutritionist given all of her new allergies. 4.  Patient will follow in clinic with me in 2 months.  At that time if  she has had no change in symptoms then will need to discuss an EGD+/-colonoscopy.  She was assigned to Dr. this morning.  Meridee Score, PA-C [facility] 04/03/2021, 8:35 AM  Cc: 06/03/2021, NP

## 2021-04-03 NOTE — Patient Instructions (Signed)
If you are age 35 or older, your body mass index should be between 23-30. Your Body mass index is 32.12 kg/m. If this is out of the aforementioned range listed, please consider follow up with your Primary Care Provider.  If you are age 14 or younger, your body mass index should be between 19-25. Your Body mass index is 32.12 kg/m. If this is out of the aformentioned range listed, please consider follow up with your Primary Care Provider.   We have sent the following medications to your pharmacy for you to pick up at your convenience: Omeprazole 20 mg   Thank you for choosing me and [facility].  Jacelyn Grip

## 2021-04-03 NOTE — Addendum Note (Signed)
Addended by: Justice Britain on: 04/03/2021 10:30 AM   Modules accepted: Orders

## 2021-04-04 NOTE — Progress Notes (Signed)
Attending Physician's Attestation   I have reviewed the chart.   I agree with the Advanced Practitioner's note, impression, and recommendations with any updates as below.    Danielle Mink Mansouraty, MD [facility] Office # [phone]  

## 2021-04-05 ENCOUNTER — Encounter: Payer: Medicaid Other | Attending: Physician Assistant | Admitting: Registered"

## 2021-04-05 ENCOUNTER — Other Ambulatory Visit: Payer: Self-pay

## 2021-04-05 ENCOUNTER — Encounter: Payer: Self-pay | Admitting: Registered"

## 2021-04-05 DIAGNOSIS — K219 Gastro-esophageal reflux disease without esophagitis: Secondary | ICD-10-CM | POA: Diagnosis not present

## 2021-04-05 NOTE — Progress Notes (Signed)
Medical Nutrition Therapy  Appointment Start time:  9:30  Appointment End time:  10:45  Primary concerns today: GERD  Referral diagnosis: GERD Preferred learning style: no preference indicated Learning readiness: ready change in progress   NUTRITION ASSESSMENT   Pt arrives stating her life has changed since having a reaction to hair chemicals in 2021. States 07/25/20 - she found out she is allergic to nickel. 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 States she doesn't have same friends anymore. States she cannot eat everything, go to the gym, and has to watch what she wears. States she was working at [employer]. Reports she feels she was misdiagnosed initially because she was initially told she had a shrimp allergy and later discovered it was nickel.   States she was admitted into hospital for depression previously and currently is not taking medication or seeing mental health provider. States she has challenges retaining information when reading, has upcoming appt with neurologist. Reports also having upcoming appointment with allergist and on the waiting list for dermatologist appointment.   States she lost 15 lbs with Herbal Life. Started taking 15 years ago. States she is now overweight by 20 lbs. States all her problems started after having challenges with hair and scalp last summer. States she eats healthy. Reports instead of losing weight she is gaining weight. States she is unsure of what is causing weight gain. States sometimes she vomits when she eats because she feels the food is not helping her. State vomiting is not intentional but happens as result of food being grilled and came in contact with nickel while being prepared. States this happens when eating out and when preparing meals at home. States after she gets sick, she drinks Herbal Life shakes for 2 days to get stomach in better place.   States she goes to bed around 10-11 pm. States she went to gym 5 days ago and had swelling all  over her body.   States milk products cause stomach inflammation  Pt expectations: wants to lose 20 lbs that she gained    Clinical Medical Hx: GERD, contact dermatitis Medications: See list Labs: none Notable Signs/Symptoms: vomiting at times Allergies: nickel  Lifestyle & Dietary Hx   Estimated daily fluid intake: 96-128 oz Supplements: none Sleep: 4-5 hrs/night Stress / self-care: Zumba, running sometimes Current average weekly physical activity: Zumba 3x/week,   24-Hr Dietary Recall First Meal (11 am): soup - lentils + bell pepper + onions + hot chili with olive oil  Snack:  Second Meal (4 pm): soup - lentils + bell pepper + onions + hot chili with olive oil  Snack:  Third Meal (9 pm): Herbal Life shake Snack:  Beverages: water (6-8*16 oz; 96-128 oz)   NUTRITION DIAGNOSIS  NB-1.1 Food and nutrition-related knowledge deficit As related to GERD.  As evidenced by pt verbalizes incomplete knowledge.   NUTRITION INTERVENTION  Nutrition education (E-1) on the following topics: Pt was educated and counseled on GERD, importance of having small meals, foods to reduce/avoid that trigger GERD, and importance of physical activity. Discussed allergy testing and having follow-up nutrition appt once allergy results are received. Pt agreed with goals listed.  Handouts Provided Include   GERD Nutrition Therapy (in Spanish)  Learning Style & Readiness for Change Teaching method utilized: Visual & Auditory  Demonstrated degree of understanding via: Teach Back  Barriers to learning/adherence to lifestyle change: none identified  Goals Established by Pt . Aim to have balanced meals throughout the day using  sample menu as guide from handout . Follow guidelines onhandout   MONITORING & EVALUATION Dietary intake, weekly physical activity prn.   Next Steps  Patient is to follow-up after allergy appointment in 04/2021 with colleague Noel Journey, MS, RDN, LDN.

## 2021-04-06 ENCOUNTER — Ambulatory Visit: Payer: Self-pay | Admitting: *Deleted

## 2021-04-06 NOTE — Telephone Encounter (Signed)
Spoke w/ Pt. Pt confirmed she will be going to UC to be seen asap.

## 2021-04-06 NOTE — Telephone Encounter (Signed)
Patient is calling to report she had allergic reaction 3/29 that made her face swell- that reaction is gone now- she is now stating she is having swelling in " female area" since 4/1. Advised UC today per protocol- no open appointment within disposition. Reason for Disposition . [1] SEVERE pain AND [2] not improved 2 hours after pain medicine  Answer Assessment - Initial Assessment Questions 1. ONSET: "When did the swelling start?" (e.g., minutes, hours, days)     Swelling started 3/29- gone now 2. LOCATION: "What part of the face is swollen?"     Whole face- was swollen- not swollen any more,vulva is swollen 3. SEVERITY: "How swollen is it?"     Very red and irritated  4. ITCHING: "Is there any itching?" If Yes, ask: "How much?"   (Scale 1-10; mild, moderate or severe)     Yes- severe 5. PAIN: "Is the swelling painful to touch?" If Yes, ask: "How painful is it?"   (Scale 1-10; mild, moderate or severe)     Yes- 8 6. FEVER: "Do you have a fever?" If Yes, ask: "What is it, how was it measured, and when did it start?"      No- 03/27/29 7. CAUSE: "What do you think is causing the face swelling?"     Patient thinks she is having allergic reaction- nickel, vulvar swelling- 4/1 8. RECURRENT SYMPTOM: "Have you had face swelling before?" If Yes, ask: "When was the last time?" "What happened that time?"     *No Answer* 9. OTHER SYMPTOMS: "Do you have any other symptoms?" (e.g., toothache, leg swelling)     *No Answer* 10. PREGNANCY: "Is there any chance you are pregnant?" "When was your last menstrual period?"       *No Answer*  Answer Assessment - Initial Assessment Questions 1. SYMPTOM: "What's the main symptom you're concerned about?" (e.g., rash, itching, swelling, dryness)     Swelling- irritation of vulva 2. LOCATION: "Where is the  Outside located?" (e.g., inside/outside, left/right)     outside 3. ONSET: "When did the  irritation  start?"     4/1 4. PAIN: "Is there any pain?" If Yes,  ask: "How bad is it?" (Scale: 1-10; mild, moderate, severe)   -  MILD (1-3): doesn't interfere with normal activities    -  MODERATE (4-7): interferes with normal activities (e.g., work or school) or awakens from sleep     -  SEVERE (8-10): excruciating pain, unable to do any normal activities    severe 5. CAUSE: "What do you think is causing the symptoms?"     unknown 6. OTHER SYMPTOMS: "Do you have any other symptoms?" (e.g., fever, vaginal bleeding, pain with urination)     burning when urinates with rash in area 7. PREGNANCY: "Is there any chance you are pregnant?" "When was your last menstrual period?"     No- LMP- 4 months ago- Nexplanon  Protocols used: VULVAR SYMPTOMS-A-AH, FACE [facility]

## 2021-04-09 ENCOUNTER — Telehealth: Payer: Self-pay | Admitting: Nurse Practitioner

## 2021-04-09 ENCOUNTER — Other Ambulatory Visit: Payer: Self-pay

## 2021-04-09 ENCOUNTER — Encounter (HOSPITAL_COMMUNITY): Payer: Self-pay

## 2021-04-09 ENCOUNTER — Ambulatory Visit (HOSPITAL_COMMUNITY)
Admission: EM | Admit: 2021-04-09 | Discharge: 2021-04-09 | Disposition: A | Discharge status: Home / Self Care | Payer: Medicaid Other | Attending: Student | Admitting: Student

## 2021-04-09 DIAGNOSIS — B373 Candidiasis of vulva and vagina: Secondary | ICD-10-CM | POA: Diagnosis not present

## 2021-04-09 DIAGNOSIS — S39012A Strain of muscle, fascia and tendon of lower back, initial encounter: Secondary | ICD-10-CM | POA: Diagnosis not present

## 2021-04-09 DIAGNOSIS — Z113 Encounter for screening for infections with a predominantly sexual mode of transmission: Secondary | ICD-10-CM | POA: Insufficient documentation

## 2021-04-09 DIAGNOSIS — Z789 Other specified health status: Secondary | ICD-10-CM | POA: Diagnosis not present

## 2021-04-09 DIAGNOSIS — B3731 Acute candidiasis of vulva and vagina: Secondary | ICD-10-CM

## 2021-04-09 LAB — POCT URINALYSIS DIPSTICK, ED / UC
Bilirubin Urine: NEGATIVE
Glucose, UA: NEGATIVE mg/dL
Ketones, ur: NEGATIVE mg/dL
Nitrite: NEGATIVE
Protein, ur: NEGATIVE mg/dL
Specific Gravity, Urine: <=1.005 (ref 1.005–1.030)
Urobilinogen, UA: 0.2 mg/dL (ref 0.0–1.0)
pH: 6 (ref 5.0–8.0)

## 2021-04-09 MED ORDER — FLUCONAZOLE 150 MG PO TABS: 150.0000 mg | ORAL_TABLET | Freq: Every day | ORAL | 0 refills | Status: DC

## 2021-04-09 NOTE — ED Triage Notes (Signed)
Pt c/o a rash on vaginal area. She states it burns when she urinates and states she is experiencing vaginal itching.

## 2021-04-09 NOTE — Telephone Encounter (Signed)
Copied from CRM [id number]. Topic: General - Other >> Apr 09, 2021  1:22 PM Pawlus, Maxine Glenn A wrote: Reason for CRM: Pt wanted to know if she could get a doctors note because she was just in the ED 04/09/21, please advise. (pt requires spanish interpreter) >> Apr 09, 2021  1:26 PM Pawlus, Maxine Glenn A wrote: Pt wanted the note for work, also wanted to know if she should return to work with her ongoing issues.

## 2021-04-09 NOTE — ED Notes (Signed)
Utilized interpreter services to explain self swab process. Also, utilized for clarification of instructions

## 2021-04-09 NOTE — Discharge Instructions (Addendum)
-  For your yeast infection, start the Diflucan (fluconazole), take 1 pill today if symptoms persist in 3 days take second pill. -We will call you if any of the lab work is abnormal and if we need to send an additional medication. -Seek additional medical attention if your symptoms worsen or persist, like worsening of symptoms despite treatment, new abdominal pain, new back pain, fever/chills.

## 2021-04-09 NOTE — ED Provider Notes (Addendum)
[facility]    CSN: [id number] Arrival date & time: 04/09/21  4098      History   Chief Complaint Chief Complaint  Patient presents with  . Vaginal Itching  . Dysuria    HPI Kristen Frank is a 35 y.o. female presenting with vaginal itching and discharge for 1 week.  States that she has thick white cottage cheese discharge and external vaginal itching.  Some irritation surrounding the vagina, described this as red.  Dysuria but denies other urinary symptoms including frequency, urgency, CVAT, abdominal pain, fever/chills.  Denies new partners or STI risk.  States she had an STI panel at her physical 1 month ago and this was normal.   HPI  Past Medical History:  Diagnosis Date  . GERD (gastroesophageal reflux disease)   . History of depression   . HSV-2 infection     Patient Active Problem List   Diagnosis Date Noted  . Allergic contact dermatitis 01/22/2021  . MDD (major depressive disorder), single episode, severe , no psychosis (HCC) 01/18/2019  . Active labor at term 02/09/2016  . Labor and delivery, indication for care 03/08/2015  . Evaluate anatomy not seen on prior sonogram   . [redacted] weeks gestation of pregnancy   . Preeclampsia 04/20/2012    Past Surgical History:  Procedure Laterality Date  . No past surgery      OB History    Gravida  3   Para  3   Term  3   Preterm      AB      Living  3     SAB      IAB      Ectopic      Multiple  0   Live Births  3            Home Medications    Prior to Admission medications   Medication Sig Start Date End Date Taking? Authorizing Provider  fluconazole (DIFLUCAN) 150 MG tablet Take 1 tablet (150 mg total) by mouth daily. Take 1 pill today, if symptoms persist in 3 days take second dose. 04/09/21  Yes Rhys Martini, PA-C  EPINEPHrine (EPIPEN 2-PAK) 0.3 mg/0.3 mL IJ SOAJ injection Inject 0.3 mg into the muscle once as needed for up to 1 dose (for severe allergic reaction). CAll 911  immediately if you have to use this medicine 03/07/21   Ambs, Norvel Richards, FNP  naproxen (NAPROSYN) 375 MG tablet Take 1 tablet (375 mg total) by mouth 2 (two) times daily. 09/26/20   Renne Crigler, PA-C  omeprazole (PRILOSEC) 20 MG capsule Take 1 capsule (20 mg total) by mouth in the morning and at bedtime. Take one capsule 30-60 minutes before breakfast and dinner. 04/03/21   Unk Lightning, PA    Family History Family History  Problem Relation Age of Onset  . Kidney disease Maternal Uncle   . Cervical cancer Maternal Grandmother   . Cancer Other   . Hypertension Other   . Diabetes Other   . Heart disease Neg Hx     Social History Social History   Tobacco Use  . Smoking status: Never Smoker  . Smokeless tobacco: Never Used  Substance Use Topics  . Alcohol use: No  . Drug use: No     Allergies   Gold sodium thiosulfate and Nickel   Review of Systems Review of Systems  Constitutional: Negative for appetite change, chills, diaphoresis and fever.  Respiratory: Negative for shortness of breath.  Cardiovascular: Negative for chest pain.  Gastrointestinal: Negative for abdominal pain, blood in stool, constipation, diarrhea, nausea and vomiting.  Genitourinary: Positive for vaginal discharge. Negative for decreased urine volume, difficulty urinating, dysuria, flank pain, frequency, genital sores, hematuria, urgency, vaginal bleeding and vaginal pain.  Musculoskeletal: Negative for back pain.  Neurological: Negative for dizziness, weakness and light-headedness.  All other systems reviewed and are negative.    Physical Exam Triage Vital Signs ED Triage Vitals  Enc Vitals Group     BP 04/09/21 0854 112/78     Pulse Rate 04/09/21 0854 68     Resp 04/09/21 0854 18     Temp 04/09/21 0854 98.5 F (36.9 C)     Temp Source 04/09/21 0854 Oral     SpO2 04/09/21 0854 99 %     Weight --      Height --      Head Circumference --      Peak Flow --      Pain Score 04/09/21 0853  0     Pain Loc --      Pain Edu? --      Excl. in GC? --    No data found.  Updated Vital Signs BP 112/78 (BP Location: Right Arm)   Pulse 68   Temp 98.5 F (36.9 C) (Oral)   Resp 18   LMP  (LMP Unknown)   SpO2 99%   Visual Acuity Right Eye Distance:   Left Eye Distance:   Bilateral Distance:    Right Eye Near:   Left Eye Near:    Bilateral Near:     Physical Exam Vitals reviewed.  Constitutional:      General: She is not in acute distress.    Appearance: Normal appearance. She is not ill-appearing.  HENT:     Head: Normocephalic and atraumatic.     Mouth/Throat:     Mouth: Mucous membranes are moist.     Comments: Moist mucous membranes Eyes:     Extraocular Movements: Extraocular movements intact.     Pupils: Pupils are equal, round, and reactive to light.  Cardiovascular:     Rate and Rhythm: Normal rate and regular rhythm.     Heart sounds: Normal heart sounds.  Pulmonary:     Effort: Pulmonary effort is normal.     Breath sounds: Normal breath sounds. No wheezing, rhonchi or rales.  Abdominal:     General: Bowel sounds are normal. There is no distension.     Palpations: Abdomen is soft. There is no mass.     Tenderness: There is no abdominal tenderness. There is no right CVA tenderness, left CVA tenderness, guarding or rebound.  Genitourinary:    Comments: deferred Musculoskeletal:     Comments: Bilateral lumbar paraspinous muscle tenderness to deep palpation.  No CVAT.  Strength and sensation intact in bilateral lower extremities.  Negative straight leg raise bilaterally.  Skin:    General: Skin is warm.     Capillary Refill: Capillary refill takes less than 2 seconds.     Comments: Good skin turgor  Neurological:     General: No focal deficit present.     Mental Status: She is alert and oriented to person, place, and time.  Psychiatric:        Mood and Affect: Mood normal.        Behavior: Behavior normal.        Thought Content: Thought content  normal.        Judgment: Judgment  normal.      UC Treatments / Results  Labs (all labs ordered are listed, but only abnormal results are displayed) Labs Reviewed  POCT URINALYSIS DIPSTICK, ED / UC - Abnormal; Notable for the following components:      Result Value   Hgb urine dipstick MODERATE (*)    Leukocytes,Ua SMALL (*)    All other components within normal limits  URINE CULTURE  CERVICOVAGINAL ANCILLARY ONLY    EKG   Radiology No results found.  Procedures Procedures (including critical care time)  Medications Ordered in UC Medications - No data to display  Initial Impression / Assessment and Plan / UC Course  I have reviewed the triage vital signs and the nursing notes.  Pertinent labs & imaging results that were available during my care of the patient were reviewed by me and considered in my medical decision making (see chart for details).     This patient is a 35 year old female presenting with vaginal candidiasis.  No CVAT or abdominal pain; she is afebrile nontachycardic.  UA today with moderate blood, small leuk.  Culture sent.  Would defer treatment until culture results that she does not have urinary symptoms other than dysuria, which can be explained by candida.  She declines STI panel.  For back pain, ibuprofen/Tylenol.  Diflucan sent as below.  States she is not pregnant.  ED return precautions discussed. Spoke with this patient using language line.  Final Clinical Impressions(s) / UC Diagnoses   Final diagnoses:  Vaginal candida  Strain of lumbar region, initial encounter  Language barrier  Routine screening for STI (sexually transmitted infection)     Discharge Instructions     -For your yeast infection, start the Diflucan (fluconazole), take 1 pill today if symptoms persist in 3 days take second pill. -We will call you if any of the lab work is abnormal and if we need to send an additional medication. -Seek additional medical attention  if your symptoms worsen or persist, like worsening of symptoms despite treatment, new abdominal pain, new back pain, fever/chills.    ED Prescriptions    Medication Sig Dispense Auth. Provider   fluconazole (DIFLUCAN) 150 MG tablet Take 1 tablet (150 mg total) by mouth daily. Take 1 pill today, if symptoms persist in 3 days take second dose. 2 tablet Rhys Martini, PA-C     PDMP not reviewed this encounter.   Rhys Martini, PA-C 04/09/21 1018    Rhys Martini, PA-C 04/09/21 1018

## 2021-04-10 ENCOUNTER — Telehealth (HOSPITAL_COMMUNITY): Payer: Self-pay | Admitting: Emergency Medicine

## 2021-04-10 LAB — CERVICOVAGINAL ANCILLARY ONLY
Bacterial Vaginitis (gardnerella): POSITIVE — AB
Candida Glabrata: NEGATIVE
Candida Vaginitis: NEGATIVE
Chlamydia: NEGATIVE
Comment: NEGATIVE
Comment: NEGATIVE
Comment: NEGATIVE
Comment: NEGATIVE
Comment: NEGATIVE
Comment: NORMAL
Neisseria Gonorrhea: NEGATIVE
Trichomonas: NEGATIVE

## 2021-04-10 LAB — URINE CULTURE: Culture: NO GROWTH

## 2021-04-10 MED ORDER — METRONIDAZOLE 500 MG PO TABS: 500.0000 mg | ORAL_TABLET | Freq: Two times a day (BID) | ORAL | 0 refills | Status: DC

## 2021-04-10 NOTE — Telephone Encounter (Signed)
Pt should have asked the the urgent care for a doctors note. Will forward to provider. Provider maybe unable to write note

## 2021-04-11 NOTE — Telephone Encounter (Signed)
I do not provide notes for ED visits and I do not see any recommendations for her to remain out of work. So she should not be missing work at this time.

## 2021-04-12 NOTE — Telephone Encounter (Signed)
Could you please contact pt and make aware of Zelda response

## 2021-04-12 NOTE — Telephone Encounter (Signed)
I called the PT, LVM inform her of the request of the doctor note, see note from provider

## 2021-04-18 ENCOUNTER — Ambulatory Visit: Payer: Medicaid Other | Admitting: Family Medicine

## 2021-04-18 ENCOUNTER — Telehealth: Payer: Self-pay | Admitting: Family Medicine

## 2021-04-18 NOTE — Telephone Encounter (Signed)
Pt states she is taking Cetirizine for allergies & medication is making her drowsy when driving. Pt would like to know if she could take a different medication.   Please advise.

## 2021-04-18 NOTE — Telephone Encounter (Signed)
She can try half of the current dose and also take this medication at night before bed so she is sleeping for the onset of side effects.

## 2021-04-18 NOTE — Telephone Encounter (Signed)
Please advise 

## 2021-04-19 NOTE — Telephone Encounter (Signed)
Called patient and she verbalized understanding to take half a dose of the cetirizine at night to prevent drowsiness when driving.

## 2021-04-19 NOTE — Telephone Encounter (Signed)
Thank you :)

## 2021-04-19 NOTE — Telephone Encounter (Signed)
Per Thurston Hole: She can try half of the current dose and also take this medication at night before bed so she is sleeping for the onset of side effects.

## 2021-04-26 ENCOUNTER — Encounter: Payer: Self-pay | Admitting: Family Medicine

## 2021-04-26 ENCOUNTER — Other Ambulatory Visit: Payer: Self-pay

## 2021-04-26 ENCOUNTER — Ambulatory Visit: Payer: Medicaid Other | Admitting: Family Medicine

## 2021-04-26 VITALS — BP 124/80 | Ht 61.0 in

## 2021-04-26 DIAGNOSIS — L2389 Allergic contact dermatitis due to other agents: Secondary | ICD-10-CM

## 2021-04-26 DIAGNOSIS — K9049 Malabsorption due to intolerance, not elsewhere classified: Secondary | ICD-10-CM | POA: Diagnosis not present

## 2021-04-26 DIAGNOSIS — L239 Allergic contact dermatitis, unspecified cause: Secondary | ICD-10-CM | POA: Diagnosis not present

## 2021-04-26 DIAGNOSIS — L501 Idiopathic urticaria: Secondary | ICD-10-CM

## 2021-04-26 MED ORDER — EUCRISA 2 % EX OINT: TOPICAL_OINTMENT | CUTANEOUS | 2 refills | Status: DC

## 2021-04-26 NOTE — Patient Instructions (Signed)
Hives (urticaria) Use the lowest amount of medication while remaining hive free . Cetirizine (Zyrtec) 10mg  twice a day and famotidine (Pepcid) 20 mg twice a day. If no symptoms for 7-14 days then decrease to. . Cetirizine (Zyrtec) 10mg  twice a day and famotidine (Pepcid) 20 mg once a day.  If no symptoms for 7-14 days then decrease to. . Cetirizine (Zyrtec) 10mg  twice a day.  If no symptoms for 7-14 days then decrease to. . Cetirizine (Zyrtec) 10mg  once a day.  May use Benadryl (diphenhydramine) as needed for breakthrough hives       If symptoms return, then step up dosage  Keep a detailed symptom journal including foods eaten, contact with allergens, medications taken, weather changes.  Suggest a referral for evaluation and treatment from dermatology specialty.   Atopic dermatitis Continue a daily moisturizing routine Continue Eucrisa twice a day as needed to red, itchy areas on your hands  Allergic contact dermatitis due to metals Continue to avoid nickel and gold  Food intolerance Skin testing to to selected foods at your last visit was negative.  Skin testing to the remaining foods available on the adult food panel was negative at today's visit. Recommend gastrointestinal specialist referral  Allergic reaction In case of an allergic reaction, take Benadryl 50 mg every 4 hours, and if life-threatening symptoms occur, inject with EpiPen 0.3 mg.  Chronic rhinoconjunctivitis Your skin testing to environmental allergens was negative to the adult environmental panel Some over the counter eye drops include Pataday one drop in each eye once a day as needed for red, itchy eyes OR Zaditor one drop in each eye twice a day as needed for red itchy eyes. Consider saline nasal rinses as needed for nasal symptoms. Use this before any medicated nasal sprays for best result  Call the clinic if this treatment plan is not working well for you  Follow up in 2 months or sooner if needed.

## 2021-04-26 NOTE — Progress Notes (Signed)
[address] [address] Dept: [phone]  FOLLOW UP NOTE  Patient ID: Kristen Frank, female    DOB: 06/08/1986  Age: 35 y.o. MRN: [id number] Date of Office Visit: 04/26/2021  Assessment  Chief Complaint: Allergy Testing (Here for allergy testing plans to see a nutritionist soon )  HPI Kristen Frank is a 35 year old female who presents to the clinic for follow-up visit.  She was last seen in this clinic on 03/07/2021 for evaluation of papular urticaria, atopic dermatitis, and food sensitivity.  At today's visit, she reports that she still feels tired most of the time.  She reports that she is not currently avoiding any foods, however, plans to see a nutrition specialist soon and would like to be evaluated for food allergy.  She does report occasional sneeze, postnasal drainage, and ocular pruritus with redness occurring mostly in the spring and summer season.  She is currently taking an antihistamine which she reports makes her very sleepy.  She is not currently using an allergy eyedrop.  She reports that she continues to have hive breakouts with the last breakout about 3 weeks ago while she was at the gym touching heavy metal.  She reports a decrease in the amount of skin breakouts or hives she is experiencing since her last visit to this clinic.  She does report that she is taking half a dose of antihistamine due to daytime somnolence with full dose antihistamine.  She reports she is feeling well enough for testing today.  She denies cardiopulmonary or integumentary symptoms at today's visit.  She reports she has not had an anaphylactic reaction to an unknown trigger nor has she needed to use her EpiPen since her last visit to this clinic.  Her current medications are listed in the chart.   Drug Allergies:  Allergies  Allergen Reactions  . Gold Sodium Thiosulfate   . Nickel     Physical Exam: BP 124/80   Ht 5\' 1"  (1.549 m)   LMP  (LMP Unknown)   BMI 32.12 kg/m     Physical Exam Vitals reviewed.  Constitutional:      Appearance: Normal appearance.  HENT:     Head: Normocephalic and atraumatic.     Right Ear: Tympanic membrane normal.     Left Ear: Tympanic membrane normal.     Nose:     Comments: Bilateral nares normal.  Pharynx normal.  Ears normal.  Eyes normal.    Mouth/Throat:     Pharynx: Oropharynx is clear.  Eyes:     Conjunctiva/sclera: Conjunctivae normal.  Cardiovascular:     Rate and Rhythm: Normal rate and regular rhythm.     Heart sounds: Normal heart sounds. No murmur heard.   Pulmonary:     Effort: Pulmonary effort is normal.     Breath sounds: Normal breath sounds.     Comments: Lungs clear to auscultation Musculoskeletal:        General: Normal range of motion.     Cervical back: Normal range of motion and neck supple.  Skin:    General: Skin is warm and dry.     Comments: No hives or eczematous areas noted at today's visit  Neurological:     Mental Status: She is alert and oriented to person, place, and time.  Psychiatric:        Mood and Affect: Mood normal.        Behavior: Behavior normal.        Thought Content:  Thought content normal.        Judgment: Judgment normal.     Diagnostics: Percutaneous skin testing to environmental panel was negative with adequate controls.  Percutaneous skin testing to selected foods was negative with adequate controls.  Assessment and Plan: 1. Allergic contact dermatitis due to other agents   2. Food intolerance   3. Idiopathic urticaria   4. Allergic contact dermatitis, unspecified trigger     Patient Instructions  Hives (urticaria) Use the lowest amount of medication while remaining hive free . Cetirizine (Zyrtec) 10mg  twice a day and famotidine (Pepcid) 20 mg twice a day. If no symptoms for 7-14 days then decrease to. . Cetirizine (Zyrtec) 10mg  twice a day and famotidine (Pepcid) 20 mg once a day.  If no symptoms for 7-14 days then decrease to. . Cetirizine  (Zyrtec) 10mg  twice a day.  If no symptoms for 7-14 days then decrease to. . Cetirizine (Zyrtec) 10mg  once a day.  May use Benadryl (diphenhydramine) as needed for breakthrough hives       If symptoms return, then step up dosage  Keep a detailed symptom journal including foods eaten, contact with allergens, medications taken, weather changes.  Suggest a referral for evaluation and treatment from dermatology specialty.   Atopic dermatitis Continue a daily moisturizing routine Continue Eucrisa twice a day as needed to red, itchy areas on your hands  Allergic contact dermatitis due to metals Continue to avoid nickel and gold  Food intolerance Skin testing to to selected foods at your last visit was negative.  Skin testing to the remaining foods available on the adult food panel was negative at today's visit. Recommend gastrointestinal specialist referral  Allergic reaction In case of an allergic reaction, take Benadryl 50 mg every 4 hours, and if life-threatening symptoms occur, inject with EpiPen 0.3 mg.  Chronic rhinoconjunctivitis Your skin testing to environmental allergens was negative to the adult environmental panel Some over the counter eye drops include Pataday one drop in each eye once a day as needed for red, itchy eyes OR Zaditor one drop in each eye twice a day as needed for red itchy eyes. Consider saline nasal rinses as needed for nasal symptoms. Use this before any medicated nasal sprays for best result  Call the clinic if this treatment plan is not working well for you  Follow up in 2 months or sooner if needed.    Return in about 2 months (around 06/26/2021), or if symptoms worsen or fail to improve.    Thank you for the opportunity to care for this patient.  Please do not hesitate to contact me with questions.  , FNP [facility]

## 2021-04-27 ENCOUNTER — Telehealth: Payer: Self-pay | Admitting: Family Medicine

## 2021-04-27 NOTE — Telephone Encounter (Signed)
Called interpreter line and spoke to patient and informed her that she could return to work as her AVS didn't show where she had to remain out of work nor did I see a note/letter stating that she needed to be out. I reviewed her results with her as well as her treatment plan for hives. Patient expressed understanding and had no other questions or concerns at the time of the call.

## 2021-04-27 NOTE — Telephone Encounter (Signed)
Pt called asking if it was okay for her to return to work after allergy testing.   Please advise.

## 2021-05-09 ENCOUNTER — Ambulatory Visit: Payer: Medicaid Other | Admitting: Diagnostic Neuroimaging

## 2021-05-09 DIAGNOSIS — Z3046 Encounter for surveillance of implantable subdermal contraceptive: Secondary | ICD-10-CM | POA: Diagnosis not present

## 2021-05-10 ENCOUNTER — Ambulatory Visit: Payer: Medicaid Other | Admitting: Family Medicine

## 2021-05-15 ENCOUNTER — Other Ambulatory Visit (HOSPITAL_COMMUNITY)
Admission: RE | Admit: 2021-05-15 | Discharge: 2021-05-15 | Disposition: A | Discharge status: Home / Self Care | Payer: Medicaid Other | Source: Ambulatory Visit | Attending: Nurse Practitioner | Admitting: Nurse Practitioner

## 2021-05-15 ENCOUNTER — Ambulatory Visit: Payer: Medicaid Other | Attending: Nurse Practitioner | Admitting: Nurse Practitioner

## 2021-05-15 ENCOUNTER — Encounter: Payer: Self-pay | Admitting: Nurse Practitioner

## 2021-05-15 ENCOUNTER — Telehealth: Payer: Self-pay

## 2021-05-15 ENCOUNTER — Other Ambulatory Visit: Payer: Self-pay

## 2021-05-15 VITALS — BP 127/87 | HR 67 | Temp 98.5°F | Resp 16 | Ht 62.0 in | Wt 168.0 lb

## 2021-05-15 DIAGNOSIS — R413 Other amnesia: Secondary | ICD-10-CM | POA: Diagnosis not present

## 2021-05-15 DIAGNOSIS — Z560 Unemployment, unspecified: Secondary | ICD-10-CM | POA: Insufficient documentation

## 2021-05-15 DIAGNOSIS — Z791 Long term (current) use of non-steroidal anti-inflammatories (NSAID): Secondary | ICD-10-CM | POA: Insufficient documentation

## 2021-05-15 DIAGNOSIS — Z114 Encounter for screening for human immunodeficiency virus [HIV]: Secondary | ICD-10-CM | POA: Insufficient documentation

## 2021-05-15 DIAGNOSIS — N760 Acute vaginitis: Secondary | ICD-10-CM | POA: Insufficient documentation

## 2021-05-15 DIAGNOSIS — R21 Rash and other nonspecific skin eruption: Secondary | ICD-10-CM | POA: Diagnosis not present

## 2021-05-15 DIAGNOSIS — F329 Major depressive disorder, single episode, unspecified: Secondary | ICD-10-CM

## 2021-05-15 DIAGNOSIS — Z79899 Other long term (current) drug therapy: Secondary | ICD-10-CM | POA: Insufficient documentation

## 2021-05-15 DIAGNOSIS — R519 Headache, unspecified: Secondary | ICD-10-CM | POA: Diagnosis not present

## 2021-05-15 DIAGNOSIS — F32A Depression, unspecified: Secondary | ICD-10-CM | POA: Insufficient documentation

## 2021-05-15 MED ORDER — CITALOPRAM HYDROBROMIDE 10 MG PO TABS: 10.0000 mg | ORAL_TABLET | Freq: Every day | ORAL | 1 refills | Status: DC

## 2021-05-15 NOTE — Progress Notes (Addendum)
Assessment & Plan:  Kristen Frank was seen today for follow-up.  Diagnoses and all orders for this visit:  Acute vaginitis -     Cervicovaginal ancillary only  Encounter for screening for HIV -     HIV antibody (with reflex)  Reactive depression -     Ambulatory referral to Psychology  Other orders -     Discontinue: citalopram (CELEXA) 10 MG tablet; Take 1 tablet (10 mg total) by mouth daily.    Patient has been counseled on age-appropriate routine health concerns for screening and prevention. These are reviewed and up-to-date. Referrals have been placed accordingly. Immunizations are up-to-date or declined.    Subjective:   Chief Complaint  Patient presents with  . Follow-up   HPI Kristen Frank 35 y.o. female presents to office today for hospital follow up to vaginal candidiasis. VRI was used to communicate directly with patient for the entire encounter including providing detailed patient instructions.   She was treated for BV in April with flagyl and diflucan. Current symptoms: itching and irritation.  She states when the weather is hot she also notices a rash on the majora labia which goes away on its own. 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     She states since she has not been herself for a long time. No motivation. PHQ9 is elevated. She has been on celexa in the past (2 months) but states she gained a significant amount of weight with it and does not wish to start an SSRI at this time. However she is open to psychotherapy. Referral placed today Depression screen [facility] 2/9 05/15/2021 04/05/2021 11/09/2020  Decreased Interest 3 1 3   Down, Depressed, Hopeless 3 1 3   PHQ - 2 Score 6 2 6   Altered sleeping 3 3 3   Tired, decreased energy 3 3 3   Change in appetite 0 3 3  Feeling bad or failure about yourself  3 1 3   Trouble concentrating 3 1 3   Moving slowly or fidgety/restless 3 1 1   Suicidal thoughts 0 0 1  PHQ-9 Score 21 14  23   Difficult doing work/chores - Somewhat difficult -    GAD 7 : Generalized Anxiety Score 05/15/2021  Nervous, Anxious, on Edge 0  Control/stop worrying 0  Worry too much - different things 0  Trouble relaxing 0  Restless 0  Easily annoyed or irritable 0  Afraid - awful might happen 0  Total GAD 7 Score 0   Headaches and Memory Loss Pt requesting referral to neurology.  C/o declining memory and headaches since 07/25/2020.  This was today she was seen in the emergency room for an allergic type reaction that was thought to be due to shrimp allergy.  However patient did not feel it was due to shrimp.  She thinks it was an ongoing reaction to something else.  Since then she has found herself to be more forgetful.  She was studying music with a  but no longer remembers the notes.  Sometimes when she is driving, she has to pull over because she does not remember where she was going.  Not working any more.  Loss job due to this situation.  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    Review of Systems  Constitutional: Negative for fever, malaise/fatigue and weight loss.  HENT: Negative.  Negative for nosebleeds.   Eyes: Negative.  Negative for blurred vision,  double vision and photophobia.  Respiratory: Negative.  Negative for cough and shortness of breath.   Cardiovascular: Negative.  Negative for chest pain, palpitations and leg swelling.  Gastrointestinal: Negative.  Negative for heartburn, nausea and vomiting.  Genitourinary:       SEE HPI  Musculoskeletal: Negative.  Negative for myalgias.  Neurological: Positive for headaches. Negative for dizziness, focal weakness and seizures.  Psychiatric/Behavioral: Positive for depression and memory loss. Negative for suicidal ideas.    Past Medical History:  Diagnosis Date  . GERD (gastroesophageal reflux disease)   . History of depression   . HSV-2 infection     Past Surgical History:   Procedure Laterality Date  . No past surgery      Family History  Problem Relation Age of Onset  . Kidney disease Maternal Uncle   . Cervical cancer Maternal Grandmother   . Cancer Other   . Hypertension Other   . Diabetes Other   . Heart disease Neg Hx     Social History Reviewed with no changes to be made today.   Outpatient Medications Prior to Visit  Medication Sig Dispense Refill  . cetirizine (ZYRTEC) 10 MG tablet Take 10 mg by mouth daily.    . famotidine (PEPCID) 40 MG tablet Take 40 mg by mouth daily.    Lennox Solders (EUCRISA) 2 % OINT twice a day as needed to red, itchy areas on your hands 100 g 2  . EPINEPHrine (EPIPEN 2-PAK) 0.3 mg/0.3 mL IJ SOAJ injection Inject 0.3 mg into the muscle once as needed for up to 1 dose (for severe allergic reaction). CAll 911 immediately if you have to use this medicine 1 each 1  . naproxen (NAPROSYN) 375 MG tablet Take 1 tablet (375 mg total) by mouth 2 (two) times daily. 20 tablet 0  . omeprazole (PRILOSEC) 20 MG capsule Take 1 capsule (20 mg total) by mouth in the morning and at bedtime. Take one capsule 30-60 minutes before breakfast and dinner. (Patient not taking: Reported on 05/15/2021) 60 capsule 3  . fluconazole (DIFLUCAN) 150 MG tablet Take 1 tablet (150 mg total) by mouth daily. Take 1 pill today, if symptoms persist in 3 days take second dose. 2 tablet 0  . metroNIDAZOLE (FLAGYL) 500 MG tablet Take 1 tablet (500 mg total) by mouth 2 (two) times daily. (Patient not taking: Reported on 05/15/2021) 14 tablet 0   No facility-administered medications prior to visit.    Allergies  Allergen Reactions  . Gold Sodium Thiosulfate   . Nickel        Objective:    BP 127/87   Pulse 67   Temp 98.5 F (36.9 C)   Resp 16   Ht 5\' 2"  (1.575 m)   Wt 168 lb (76.2 kg)   LMP 05/08/2021   SpO2 100%   BMI 30.73 kg/m  Wt Readings from Last 3 Encounters:  05/15/21 168 lb (76.2 kg)  04/03/21 170 lb (77.1 kg)  03/07/21 163 lb 6.4 oz  (74.1 kg)    Physical Exam Vitals and nursing note reviewed.  Constitutional:      Appearance: She is well-developed.  HENT:     Head: Normocephalic and atraumatic.  Cardiovascular:     Rate and Rhythm: Normal rate and regular rhythm.     Heart sounds: Normal heart sounds. No murmur heard. No friction rub. No gallop.   Pulmonary:     Effort: Pulmonary effort is normal. No tachypnea or respiratory distress.  Breath sounds: Normal breath sounds. No decreased breath sounds, wheezing, rhonchi or rales.  Chest:     Chest wall: No tenderness.  Abdominal:     General: Bowel sounds are normal.     Palpations: Abdomen is soft.  Musculoskeletal:        General: Normal range of motion.     Cervical back: Normal range of motion.  Skin:    General: Skin is warm and dry.  Neurological:     Mental Status: She is alert and oriented to person, place, and time.     Coordination: Coordination normal.  Psychiatric:        Mood and Affect: Mood is depressed. Affect is tearful.        Behavior: Behavior is cooperative.        Thought Content: Thought content normal.        Judgment: Judgment normal.          Patient has been counseled extensively about nutrition and exercise as well as the importance of adherence with medications and regular follow-up. The patient was given clear instructions to go to ER or return to medical center if symptoms don't improve, worsen or new problems develop. The patient verbalized understanding.   Follow-up: Return if symptoms worsen or fail to improve.   Claiborne Rigg, FNP-BC [facility] [place], [place] [phone]   05/15/2021, 10:00 AM

## 2021-05-15 NOTE — Telephone Encounter (Signed)
This patient has been prescribed eucrisa 2%, which will need a PA, but pt. has only tried one cream, hydrocortisone 1%. Patient will have to try another ointment or cream before the eucrisa maybe approved. Patient has had patch tests done for her atopic dermatitis. Please advise.

## 2021-05-15 NOTE — Patient Instructions (Signed)
[facility] [place] Address: [address], [place], [place] [address] Open ? Closes 5PM Phone: [phone]

## 2021-05-15 NOTE — Progress Notes (Signed)
Medications not helping Feels the same way.   Vaginal symptoms come and go.

## 2021-05-15 NOTE — Telephone Encounter (Signed)
Can you please order triamcinolone 0.1% ointment twice a day as needed to red, itchy areas below your face? Thank you

## 2021-05-16 LAB — CERVICOVAGINAL ANCILLARY ONLY
Bacterial Vaginitis (gardnerella): NEGATIVE
Candida Glabrata: NEGATIVE
Candida Vaginitis: NEGATIVE
Chlamydia: NEGATIVE
Comment: NEGATIVE
Comment: NEGATIVE
Comment: NEGATIVE
Comment: NEGATIVE
Comment: NEGATIVE
Comment: NORMAL
Neisseria Gonorrhea: NEGATIVE
Trichomonas: NEGATIVE

## 2021-05-16 LAB — HIV ANTIBODY (ROUTINE TESTING W REFLEX): HIV Screen 4th Generation wRfx: NONREACTIVE

## 2021-05-16 MED ORDER — TRIAMCINOLONE ACETONIDE 0.1 % EX OINT: TOPICAL_OINTMENT | CUTANEOUS | 3 refills | Status: DC

## 2021-05-16 NOTE — Telephone Encounter (Signed)
Sent in triamcinolone ointment 0.1%. Will notify the patient she will have to try the triamcinolone ointment 0.1% before eucrisa can get approved.

## 2021-06-28 ENCOUNTER — Emergency Department (HOSPITAL_COMMUNITY)
Admission: EM | Admit: 2021-06-28 | Discharge: 2021-06-29 | Disposition: A | Discharge status: Home / Self Care | Payer: Medicaid Other | Attending: Emergency Medicine | Admitting: Emergency Medicine

## 2021-06-28 DIAGNOSIS — M79604 Pain in right leg: Secondary | ICD-10-CM | POA: Insufficient documentation

## 2021-06-28 DIAGNOSIS — Z5321 Procedure and treatment not carried out due to patient leaving prior to being seen by health care provider: Secondary | ICD-10-CM | POA: Insufficient documentation

## 2021-06-28 NOTE — ED Triage Notes (Signed)
Pt stung by wasp last Tuesday, swollen R leg, whole body swelling, states she feels unable to swallow "as well as normal." States she feels it has gotten worse. Airway intact,  Pt states similar episodes recently, feels she is having allergic reactions to everything lately. Reddened area & slight swelling/ welt noted to site on R leg 8/10 pain

## 2021-06-29 NOTE — ED Notes (Signed)
Patient not on hospital grounds

## 2021-07-04 ENCOUNTER — Encounter: Payer: Self-pay | Admitting: Nurse Practitioner

## 2021-07-10 ENCOUNTER — Telehealth: Payer: Self-pay | Admitting: Family Medicine

## 2021-07-10 NOTE — Telephone Encounter (Signed)
Patient was stung by a bee on her leg on 7/1 and had a reaction. Patient had redness, itchiness and then started to have swelling in her face. Patient went to the ER, but had to wait over 3 hours and left. Patient did not take any Benadryl or allergy medication.  Please advise.

## 2021-07-10 NOTE — Telephone Encounter (Signed)
Can you please have this patient make an appointment for evaluation of stinging insect allergy. Please remind her to take benadryl for an allergic reaction and wait in the ED or call 911 is she needs medical treatment. Thank you

## 2021-07-10 NOTE — Telephone Encounter (Signed)
Please Advise

## 2021-07-12 NOTE — Telephone Encounter (Signed)
Patient has a voice mailbox that is currently not set up so I was not able to leave a message.

## 2021-07-18 NOTE — Telephone Encounter (Signed)
Called and spoke with patient, she has been scheduled to see Thurston Hole in office next Thursday.

## 2021-07-26 ENCOUNTER — Ambulatory Visit (INDEPENDENT_AMBULATORY_CARE_PROVIDER_SITE_OTHER): Payer: Medicaid Other | Admitting: Family Medicine

## 2021-07-26 ENCOUNTER — Encounter: Payer: Self-pay | Admitting: Family Medicine

## 2021-07-26 ENCOUNTER — Other Ambulatory Visit: Payer: Self-pay

## 2021-07-26 VITALS — BP 110/70 | HR 72 | Temp 98.3°F | Resp 16

## 2021-07-26 DIAGNOSIS — L2389 Allergic contact dermatitis due to other agents: Secondary | ICD-10-CM | POA: Diagnosis not present

## 2021-07-26 DIAGNOSIS — J310 Chronic rhinitis: Secondary | ICD-10-CM

## 2021-07-26 DIAGNOSIS — K9049 Malabsorption due to intolerance, not elsewhere classified: Secondary | ICD-10-CM | POA: Insufficient documentation

## 2021-07-26 DIAGNOSIS — L239 Allergic contact dermatitis, unspecified cause: Secondary | ICD-10-CM

## 2021-07-26 DIAGNOSIS — T63481A Toxic effect of venom of other arthropod, accidental (unintentional), initial encounter: Secondary | ICD-10-CM | POA: Insufficient documentation

## 2021-07-26 DIAGNOSIS — L501 Idiopathic urticaria: Secondary | ICD-10-CM | POA: Diagnosis not present

## 2021-07-26 HISTORY — DX: Idiopathic urticaria: L50.1

## 2021-07-26 NOTE — Progress Notes (Signed)
[address] Kristen Frank [place] [address] Dept: [phone]  FOLLOW UP NOTE  Patient ID: Kristen Frank, female    DOB: Nov 13, 1986  Age: 35 y.o. MRN: [id number] Date of Office Visit: 07/26/2021  Assessment  Chief Complaint: Other (Bee sting- rt leg and rt facial swelling, "pumps all over and itchy".)  HPI Kristen Frank is a 35 year old female who presents the clinic for evaluation of a bee sting.  She was last seen in this clinic on 04/26/2021 for evaluation of chronic rhinitis, allergic contact dermatitis to nickel and gold, urticaria, atopic dermatitis, and food sensitivity.  She has previously tested negative to the food panel and negative to the adult environmental panel.  She was positive to nickel and gold on patch testing.  At today's visit she reports that on June 30 she was stung by a bee on her right thigh.  She reports immediately that she began to experience numbness in this area for the next couple of days.  She reports that, on the third day after the bee sting, she experienced swelling on the right side of her face and reports she had some tightness in her throat. She did not take Benadryl or use her EpiPen at that time.  Her chart indicates she checked into the ED on June 30, however, she did not stay to be evaluated. She reports that she has not been stung by bee previous to the bee sting on June 30.  At today's visit, she reports urticaria has been poorly controlled with random hives occurring.  She is not currently taking famotidine or cetirizine.  She does report that her partner has dental fillings and where ever she is exposed to these fillings she breaks out in a rash that can last for several days. She denies concomitant cardiopulmonary or gastrointestinal symptoms with this rash.  She is currently seeing a nutritionist for food intolerance.  Her current medications are listed in the chart.   Drug Allergies:  Allergies  Allergen Reactions  . Gold Sodium Thiosulfate    . Nickel     Physical Exam: BP 110/70   Pulse 72   Temp 98.3 F (36.8 C) (Temporal)   Resp 16   SpO2 97%    Physical Exam Vitals reviewed.  Constitutional:      Appearance: Normal appearance.  HENT:     Head: Normocephalic and atraumatic.     Right Ear: Tympanic membrane normal.     Left Ear: Tympanic membrane normal.     Nose:     Comments: Bilateral nares normal.  Pharynx normal.  Ears normal.  Eyes normal.    Mouth/Throat:     Pharynx: Oropharynx is clear.  Eyes:     Conjunctiva/sclera: Conjunctivae normal.  Cardiovascular:     Rate and Rhythm: Normal rate and regular rhythm.     Heart sounds: Normal heart sounds. No murmur heard. Pulmonary:     Effort: Pulmonary effort is normal.     Breath sounds: Normal breath sounds.     Comments: Lungs clear to auscultation Musculoskeletal:        General: Normal range of motion.     Cervical back: Normal range of motion and neck supple.  Skin:    General: Skin is warm and dry.     Comments: No rashes evident at today's visit.  She does have pictures of rashes on her phone that she reports have occurred on previous occasions.  They look raised and red.  Neurological:  Mental Status: She is alert and oriented to person, place, and time.  Psychiatric:        Mood and Affect: Mood normal.        Behavior: Behavior normal.        Thought Content: Thought content normal.        Judgment: Judgment normal.    Assessment and Plan: 1. Allergic reaction to insect sting, accidental or unintentional, initial encounter   2. Allergic contact dermatitis due to other agents   3. Food intolerance   4. Idiopathic urticaria   5. Allergic contact dermatitis, unspecified trigger   6. Chronic rhinitis      Patient Instructions  Hives (urticaria) Use the lowest amount of medication while remaining hive free Cetirizine (Zyrtec) 10mg  twice a day and famotidine (Pepcid) 20 mg twice a day. If no symptoms for 7-14 days then decrease  to. Cetirizine (Zyrtec) 10mg  twice a day and famotidine (Pepcid) 20 mg once a day.  If no symptoms for 7-14 days then decrease to. Cetirizine (Zyrtec) 10mg  twice a day.  If no symptoms for 7-14 days then decrease to. Cetirizine (Zyrtec) 10mg  once a day.  May use Benadryl (diphenhydramine) as needed for breakthrough hives       If symptoms return, then step up dosage  Keep a detailed symptom journal including foods eaten, contact with allergens, medications taken, weather changes.   Atopic dermatitis Continue a daily moisturizing routine Continue Eucrisa twice a day as needed to red, itchy areas on your hands  Allergic contact dermatitis due to metals Continue to avoid nickel and gold  Food intolerance Skin testing on 2 previous visits was negative Continue working with the nutritionist specialist as you have been. If no improvement, then recommend gastrointestinal specialist referral  Allergic reaction In case of an allergic reaction, take Benadryl 50 mg every 4 hours, and if life-threatening symptoms occur, inject with EpiPen 0.3 mg.  Chronic rhinoconjunctivitis Skin testing to environmental allergens was negative at a pervious visit Some over the counter eye drops include Pataday one drop in each eye once a day as needed for red, itchy eyes OR Zaditor one drop in each eye twice a day as needed for red itchy eyes. Consider saline nasal rinses as needed for nasal symptoms. Use this before any medicated nasal sprays for best result  Stinging insect allergy We have ordered a lab to help evaluate your stinging insect allergy.  We will call you when the results become available In case of an allergic reaction, take Benadryl 50 mg every 4 hours, and if life-threatening symptoms occur, inject with EpiPen 0.3 mg.  Call the clinic if this treatment plan is not working well for you  Follow up in 2 months or sooner if needed.  Return in about 2 months (around 09/26/2021), or if symptoms  worsen or fail to improve.    Thank you for the opportunity to care for this patient.  Please do not hesitate to contact me with questions.  , FNP [facility]

## 2021-07-26 NOTE — Patient Instructions (Addendum)
Hives (urticaria) Use the lowest amount of medication while remaining hive free Cetirizine (Zyrtec) 10mg  twice a day and famotidine (Pepcid) 20 mg twice a day. If no symptoms for 7-14 days then decrease to. Cetirizine (Zyrtec) 10mg  twice a day and famotidine (Pepcid) 20 mg once a day.  If no symptoms for 7-14 days then decrease to. Cetirizine (Zyrtec) 10mg  twice a day.  If no symptoms for 7-14 days then decrease to. Cetirizine (Zyrtec) 10mg  once a day.  May use Benadryl (diphenhydramine) as needed for breakthrough hives       If symptoms return, then step up dosage  Keep a detailed symptom journal including foods eaten, contact with allergens, medications taken, weather changes.   Atopic dermatitis Continue a daily moisturizing routine Continue Eucrisa twice a day as needed to red, itchy areas on your hands  Allergic contact dermatitis due to metals Continue to avoid nickel and gold  Food intolerance Skin testing on 2 previous visits was negative Continue working with the nutritionist specialist as you have been. If no improvement, then recommend gastrointestinal specialist referral  Allergic reaction In case of an allergic reaction, take Benadryl 50 mg every 4 hours, and if life-threatening symptoms occur, inject with EpiPen 0.3 mg.  Chronic rhinoconjunctivitis Skin testing to environmental allergens was negative at a pervious visit Some over the counter eye drops include Pataday one drop in each eye once a day as needed for red, itchy eyes OR Zaditor one drop in each eye twice a day as needed for red itchy eyes. Consider saline nasal rinses as needed for nasal symptoms. Use this before any medicated nasal sprays for best result  Stinging insect allergy We have ordered a lab to help evaluate your stinging insect allergy.  We will call you when the results become available In case of an allergic reaction, take Benadryl 50 mg every 4 hours, and if life-threatening symptoms occur,  inject with EpiPen 0.3 mg.  Call the clinic if this treatment plan is not working well for you  Follow up in 2 months or sooner if needed.

## 2021-07-29 LAB — ALLERGEN STINGING INSECT PANEL
Honeybee IgE: <0.1 kU/L
Hornet, White Face, IgE: <0.1 kU/L
Hornet, Yellow, IgE: <0.1 kU/L
Paper Wasp IgE: <0.1 kU/L
Yellow Jacket, IgE: <0.1 kU/L

## 2021-07-30 NOTE — Progress Notes (Signed)
Can you please let this patient know that her lab work was negative to stinging insect allergy. Please make an appointment for her to do skin testing to stinging insects. Thank you

## 2021-07-31 ENCOUNTER — Telehealth: Payer: Self-pay | Admitting: Diagnostic Neuroimaging

## 2021-07-31 NOTE — Telephone Encounter (Signed)
Appointment needs rescheduled because the office will be closed on Friday. Kristen Frank called both phone numbers on file multiple times and was not able to leave the patient a voice mail. I sent a text with the mychart link as well. Plan was to reschedule her with Dr. Teresa Coombs next week.

## 2021-08-03 ENCOUNTER — Ambulatory Visit: Payer: Medicaid Other | Admitting: Diagnostic Neuroimaging

## 2021-08-14 ENCOUNTER — Telehealth: Payer: Self-pay | Admitting: Nurse Practitioner

## 2021-08-14 NOTE — Telephone Encounter (Signed)
Pt would like to speak w/ Asante for help w/ current life changing situations asap please. Thank you

## 2021-08-16 ENCOUNTER — Ambulatory Visit: Payer: Medicaid Other | Attending: Physician Assistant | Admitting: Physician Assistant

## 2021-08-16 ENCOUNTER — Other Ambulatory Visit: Payer: Self-pay

## 2021-08-16 ENCOUNTER — Encounter: Payer: Self-pay | Admitting: Physician Assistant

## 2021-08-16 VITALS — BP 109/67 | HR 67 | Ht 62.0 in | Wt 170.0 lb

## 2021-08-16 DIAGNOSIS — Z789 Other specified health status: Secondary | ICD-10-CM | POA: Diagnosis not present

## 2021-08-16 DIAGNOSIS — Z1331 Encounter for screening for depression: Secondary | ICD-10-CM

## 2021-08-16 DIAGNOSIS — L309 Dermatitis, unspecified: Secondary | ICD-10-CM | POA: Diagnosis not present

## 2021-08-16 DIAGNOSIS — F32A Depression, unspecified: Secondary | ICD-10-CM | POA: Diagnosis not present

## 2021-08-16 NOTE — Progress Notes (Signed)
Needs referral to derm

## 2021-08-16 NOTE — Progress Notes (Signed)
Kristen Frank, is a 35 y.o. female  EKC:[id number]  [id number]:[id number]  DOB - [birth year]  Chief Complaint  Patient presents with   Rash       Subjective:   Kristen Frank is a 35 y.o. female here today for a dermatology referral.  She has gotten rashes on and off for years and sometimes gets pustules on her scalp.  Unfortunately she has none of these today and is adamant to see a dermatologist.    She would like to have counseling for depression.  She is not interested in medication.  She took meds about 4 yrs ago but not interested in meds at this time. Has passive SI.  No thoughts or plans.  She says she would never actually harm herself.  Feels hope for the future/asks for help/desires therapy relationship  No problems updated.  ALLERGIES: Allergies  Allergen Reactions   Gold Sodium Thiosulfate    Nickel     PAST MEDICAL HISTORY: Past Medical History:  Diagnosis Date   GERD (gastroesophageal reflux disease)    History of depression    HSV-2 infection    Idiopathic urticaria 07/26/2021    MEDICATIONS AT HOME: Prior to Admission medications   Medication Sig Start Date End Date Taking? Authorizing Provider  cetirizine (ZYRTEC) 10 MG tablet Take 10 mg by mouth daily.   Yes [provider]  Crisaborole (EUCRISA) 2 % OINT twice a day as needed to red, itchy areas on your hands 04/26/21  Yes Ambs, Norvel Richards, FNP  EPINEPHrine (EPIPEN 2-PAK) 0.3 mg/0.3 mL IJ SOAJ injection Inject 0.3 mg into the muscle once as needed for up to 1 dose (for severe allergic reaction). CAll 911 immediately if you have to use this medicine 03/07/21  Yes Ambs, Norvel Richards, FNP  famotidine (PEPCID) 40 MG tablet Take 40 mg by mouth daily.   Yes [provider]  naproxen (NAPROSYN) 375 MG tablet Take 1 tablet (375 mg total) by mouth 2 (two) times daily. 09/26/20  Yes Renne Crigler, PA-C  omeprazole (PRILOSEC) 20 MG capsule Take 1 capsule (20 mg total) by mouth in the morning and at bedtime. Take one  capsule 30-60 minutes before breakfast and dinner. 04/03/21  Yes Unk Lightning, PA  triamcinolone ointment (KENALOG) 0.1 % Apply to red itchy areas below face. 05/16/21  Yes Ambs, Norvel Richards, FNP    ROS: Neg HEENT Neg resp Neg cardiac Neg GI Neg GU Neg MS Neg neuro Depression screen [facility] 2/9 08/16/2021 05/15/2021 04/05/2021  Decreased Interest 3 3 1   Down, Depressed, Hopeless 3 3 1   PHQ - 2 Score 6 6 2   Altered sleeping 3 3 3   Tired, decreased energy 3 3 3   Change in appetite 2 0 3  Feeling bad or failure about yourself  3 3 1   Trouble concentrating 3 3 1   Moving slowly or fidgety/restless 3 3 1   Suicidal thoughts 1 0 0  PHQ-9 Score 24 21 14   Difficult doing work/chores - - Somewhat difficult    Objective:   Vitals:   08/16/21 1524  BP: 109/67  Pulse: 67  SpO2: 97%  Weight: 170 lb (77.1 kg)  Height: 5\' 2"  (1.575 m)   Exam General appearance : Awake, alert, not in any distress. Speech Clear. Not toxic looking HEENT: Atraumatic and Normocephalic, pupils equally reactive to light and accomodation Neck: Supple, no JVD. No cervical lymphadenopathy.  Chest: Good air entry bilaterally, CTAB.  No rales/rhonchi/wheezing CVS: S1 S2 regular, no murmurs.  Extremities:  B/L Lower Ext shows no edema, both legs are warm to touch Neurology: Awake alert, and oriented X 3, CN II-XII intact, Non focal Skin: No Rash  Data Review Lab Results  Component Value Date   HGBA1C 6.0 (H) 11/09/2020    Assessment & Plan   1. Depression, unspecified depression type Message to [facility] to f/up on counseling choices.  I have encouraged her about healthy life choices and self-care  2. Dermatitis - Ambulatory referral to Dermatology  3. Positive depression screening See #1.  Not suicidal.  Expresses protective factors.  No plan or intent.  Occasional passive thoughts  4. Language barrier [employer] (Alejandro)interpreters used and additional time performing visit was required.   Patient  have been counseled extensively about nutrition and exercise. Other issues discussed during this visit include: low cholesterol diet, weight control and daily exercise, foot care, annual eye examinations at Ophthalmology, importance of adherence with medications and regular follow-up. We also discussed long term complications of uncontrolled diabetes and hypertension.   Return in about 6 months (around 02/16/2022) for PCP.  The patient was given clear instructions to go to ER or return to medical center if symptoms don't improve, worsen or new problems develop. The patient verbalized understanding. The patient was told to call to get lab results if they haven't heard anything in the next week.      Georgian Co, PA-C [facility] [place], [place] [phone]   08/16/2021, 3:39 PM Patient ID: Kristen Frank, female   DOB: 02/02/1986, 35 y.o.   MRN: [id number]

## 2021-08-17 ENCOUNTER — Ambulatory Visit: Payer: Self-pay | Admitting: Family

## 2021-08-21 NOTE — Telephone Encounter (Signed)
Spoke with pt via spanish interpreter and scheduled appt with me for 09/04/21 at 10:00. I noticed in provider notes that pt indicated passive SI. Pt continues to endorse passive SI, denies plan/intent. I provided pt with suicide crisis resources and advised pt to utilize them if SI arises with plan, means, and intent.

## 2021-09-04 ENCOUNTER — Other Ambulatory Visit: Payer: Self-pay

## 2021-09-04 ENCOUNTER — Ambulatory Visit (INDEPENDENT_AMBULATORY_CARE_PROVIDER_SITE_OTHER): Payer: Medicaid Other | Admitting: Clinical

## 2021-09-04 DIAGNOSIS — F4323 Adjustment disorder with mixed anxiety and depressed mood: Secondary | ICD-10-CM

## 2021-09-04 DIAGNOSIS — R440 Auditory hallucinations: Secondary | ICD-10-CM | POA: Diagnosis not present
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
## 2021-09-04 NOTE — BH Specialist Note (Deleted)
Integrated Behavioral Health Initial In-Person Visit  MRN: [id number] Name: Kristen Frank  Number of Integrated Behavioral Health Clinician visits:: {IBH Number of Visits:21014052} Session Start time: ***  Session End time: *** Total time: {IBH Total Time:21014050} minutes  Types of Service: {CHL AMB TYPE OF SERVICE:[phone]}  Interpretor:{yes TI:[id number]} Interpretor Name and Language: Vernona Rieger   Warm Hand Off Completed.        Subjective: Kristen Frank is a 35 y.o. female accompanied by {CHL AMB ACCOMPANIED IP:[id number]} Patient was referred by *** for ***. Patient reports the following symptoms/concerns: *** Duration of problem: ***; Severity of problem: {Mild/Moderate/Severe:20260}  Objective: Mood: {BHH MOOD:22306} and Affect: {BHH AFFECT:22307} Risk of harm to self or others: {CHL AMB BH Suicide Current Mental Status:21022748}  Life Context: Family and Social: *** School/Work: *** Self-Care: *** Life Changes: ***  Patient and/or Family's Strengths/Protective Factors: {CHL AMB BH PROTECTIVE FACTORS:[phone]}  Goals Addressed: Patient will: Reduce symptoms of: {IBH Symptoms:21014056} Increase knowledge and/or ability of: {IBH Patient Tools:21014057}  Demonstrate ability to: {IBH Goals:21014053}  Progress towards Goals: {CHL AMB BH PROGRESS TOWARDS GOALS:9096936486}  Interventions: Interventions utilized: {IBH Interventions:21014054}  Standardized Assessments completed: {IBH Screening Tools:21014051}  Patient and/or Family Response: ***  Patient Centered Plan: Patient is on the following Treatment Plan(s):  ***  Assessment: Patient currently experiencing ***.   Patient may benefit from ***.  Plan: Follow up with behavioral health clinician on : *** Behavioral recommendations: *** Referral(s): {IBH Referrals:21014055} "From scale of 1-10, how likely are you to follow plan?": ***  Kristen Trotta C Jetta Murray, LCSW

## 2021-09-19 ENCOUNTER — Telehealth: Payer: Self-pay | Admitting: Family Medicine

## 2021-09-19 NOTE — Telephone Encounter (Signed)
Patient called and needs to talk with a nurse, because her meds are not working and she speaks spanish. [phone].

## 2021-09-19 NOTE — Telephone Encounter (Signed)
Called to speak to the patient about medications patient stated medications are not working she's still having swelling in her left hand, arm and leg, she states the medications are not working.She was seen July 26, 2021

## 2021-09-20 NOTE — Telephone Encounter (Signed)
Thank you :)

## 2021-09-20 NOTE — Telephone Encounter (Signed)
Spoke to patient through translating service and patient stated she will take photos to our [place] office today. She also will call her PCP to follow up with them for the swelling.

## 2021-09-20 NOTE — Telephone Encounter (Signed)
Swelling in her left hand, arm and leg does not sound like an allergy symptom. Can you please have her send pictures of the swelling. Can you please have her follow up with her PCP for the swelling. Thank you

## 2021-09-25 ENCOUNTER — Ambulatory Visit (INDEPENDENT_AMBULATORY_CARE_PROVIDER_SITE_OTHER): Payer: Medicaid Other | Admitting: Clinical

## 2021-09-25 ENCOUNTER — Other Ambulatory Visit: Payer: Self-pay

## 2021-09-25 DIAGNOSIS — F4323 Adjustment disorder with mixed anxiety and depressed mood: Secondary | ICD-10-CM

## 2021-09-25 DIAGNOSIS — R45851 Suicidal ideations: Secondary | ICD-10-CM

## 2021-09-27 NOTE — BH Specialist Note (Signed)
Integrated Behavioral Health Follow Up In-Person Visit  MRN: [id number] Name: Kristen Frank  Number of Integrated Behavioral Health Clinician visits: 2/6 Session Start time: 11:00am  Session End time: 12:00pm Total time: 60 minutes  Types of Service: Individual psychotherapy  Interpretor:Yes.   Interpretor Name and Language: Vernona Rieger  Subjective: Kristen Frank is a 35 y.o. female accompanied by  self Patient was referred by PA Georgian Co for depression, anxiety, and passive SI. Patient reports the following symptoms/concerns: Reports feeling depressed, anxious, decreased interest in activities, anxiousness, excessive worrying, self-esteem disturbances, and racing thoughts. Reports continues frustration with skin problems and allergies. Reports not feeling like herself since developing allergies and not working for [employer]. Duration of problem: 1 year; Severity of problem: moderate  Objective: Mood: Anxious and Depressed and Affect: Appropriate and Tearful Risk of harm to self or others: Suicidal ideation  Life Context: Family and Social: Reports that she has three children. Reports receiving support from her children's father.  School/Work: Reports that she is unemployed and was previously working for [employer] about one year ago but has stopped since experiencing health changes. Reports receiving financial support from her children's father and mother-in-law.  Self-Care: Reports exercise and caring for her home as coping skills.  Life Changes: Reports that she has experienced skin changes as a result of the development of allergies. Reports that her health lead her to no longer working for [employer]. Reports that her self-esteem has been affected and she often feels like she can't do certain activities, eat certain foods, or go outside due to allergies.  (No changes to life context)  Patient and/or Family's Strengths/Protective Factors: Concrete supports in place (healthy food,  safe environments, etc.) and Sense of purpose  Goals Addressed: Patient will:  Reduce symptoms of: anxiety and depression   Increase knowledge and/or ability of: coping skills   Demonstrate ability to: Increase healthy adjustment to current life circumstances  Progress towards Goals: Ongoing  Interventions: Interventions utilized:  Mindfulness or Management consultant, CBT Cognitive Behavioral Therapy, Supportive Counseling, and Psychoeducation and/or Health Education Standardized Assessments completed: GAD-7 and PHQ 9  Patient and/or Family Response: Pt receptive to tx. Pt receptive to psychoeducation provided on depression and anxiety. Pt receptive to cognitive restructuring utilized to improve pt's cognitive processing skills. Pt receptive to utilizing positive affirmations and deep breathing exercises.  Patient Centered Plan: Patient is on the following Treatment Plan(s): Depression and Anxiety Assessment: Endorses passive SI. Denies plan/intent. Endorses protective factors as children. Pt provided with crisis resources and endorsed proper precaution to take if SI arises with plan, means, and intent. Patient currently experiencing ongoing depression and anxiety. Pt continuing to experience self-esteem disturbances. Pt has difficulty with cognitive processing skills.   Patient may benefit from cognitive restructuring to assist with cognitive processing skills. LCSWA provided psychoeducation on depression and anxiety. LCSWA utilized cognitive restructuring to assist with cognitive processing skills. LCSWA encouraged pt to utilize positive affirmations and deep breathing exercises. Pt is still being referred to outpatient therapy. LCSWA will fu with pt.  Plan: Follow up with behavioral health clinician on : 10/16/21 Behavioral recommendations: Utilize provided crisis resources if SI arises with plan, means, and intent. Utilize positive affirmations and deep breathing exercises. Referral(s):  Integrated [employer] [facility] (In Clinic) "From scale of 1-10, how likely are you to follow plan?": 10  Ali Mclaurin C Jessi Pitstick, LCSW

## 2021-10-16 ENCOUNTER — Other Ambulatory Visit: Payer: Self-pay

## 2021-10-16 ENCOUNTER — Ambulatory Visit (INDEPENDENT_AMBULATORY_CARE_PROVIDER_SITE_OTHER): Payer: Medicaid Other | Admitting: Clinical

## 2021-10-16 ENCOUNTER — Telehealth: Payer: Self-pay

## 2021-10-16 DIAGNOSIS — R45851 Suicidal ideations: Secondary | ICD-10-CM

## 2021-10-16 DIAGNOSIS — F4323 Adjustment disorder with mixed anxiety and depressed mood: Secondary | ICD-10-CM | POA: Diagnosis not present

## 2021-10-16 DIAGNOSIS — F331 Major depressive disorder, recurrent, moderate: Secondary | ICD-10-CM

## 2021-10-16 NOTE — Telephone Encounter (Signed)
Outpatient Therapy referral placed for [facility] on 10/16/21.

## 2021-10-16 NOTE — BH Specialist Note (Signed)
Integrated Behavioral Health Follow Up In-Person Visit  MRN: [id number] Name: Kristen Frank  Number of Integrated Behavioral Health Clinician visits: 3/6 Session Start time: 11:00am  Session End time: 11:45am Total time: 45  minutes  Types of Service: Individual psychotherapy  Interpretor:Yes.   Interpretor Name and Language: Nile Riggs (Spanish)  Subjective: Kristen Frank is a 35 y.o. female accompanied by  self Patient was referred by PCP Loreen Freud for depression, anxiety, and passive SI. Patient reports the following symptoms/concerns: Reports decreased interest in activities, feeling depressed, trouble sleeping, decreased energy, decreased appetite, self-esteem disturbances, trouble concentrating, fidgeting, anxiousness, excessive worrying, trouble relaxing, restlessness, and irritability. Pt continues to experience frustrations with her skin and allergies.  Duration of problem: 1 year; Severity of problem: moderate  Objective: Mood: Anxious and Depressed and Affect: Appropriate Risk of harm to self or others: Suicidal ideation  Life Context: Family and Social: Reports that she has three children. Reports receiving support from her children's father.  School/Work: Reports that she is unemployed and was previously working for [employer] about one year ago but has stopped since experiencing health changes. Reports receiving financial support from her children's father and mother-in-law.  Self-Care: Reports exercise and caring for her home as coping skills.  Life Changes: Reports that she has experienced skin changes as a result of the development of allergies. Reports that her health lead her to no longer working for [employer]. Reports that her self-esteem has been affected and she often feels like she can't do certain activities, eat certain foods, or go outside due to allergies.  (No changes to life context)  Patient and/or Family's Strengths/Protective Factors: Concrete  supports in place (healthy food, safe environments, etc.) and Sense of purpose  Goals Addressed: Patient will:  Reduce symptoms of: anxiety and depression   Increase knowledge and/or ability of: coping skills   Demonstrate ability to: Increase healthy adjustment to current life circumstances  Progress towards Goals: Ongoing  Interventions: Interventions utilized:  Mindfulness or Management consultant, CBT Cognitive Behavioral Therapy, Supportive Counseling, and Psychoeducation and/or Health Education Standardized Assessments completed: GAD-7 and PHQ 9  Patient and/or Family Response: Pt receptive to tx. Pt receptive to psychoeducation provided on depression and anxiety. Pt receptive to thought record utilized to assist pt in identifying her thoughts and emotions. Pt receptive to cognitive restructuring utilized to decrease pt's negative thoughts and assist with cognitive processing skills. Pt receptive to utilizing positive affirmations, exercising, and deep breathing exercises.   Patient Centered Plan: Patient is on the following Treatment Plan(s): Depression and anxiety  Assessment: Endorses passive SI. Denies plan/intent. Endorses protective factor as children. Verbal safety plan completed and pt provided with crisis resources. Patient currently experiencing ongoing depression and anxiety. Pt continues to experiences self-esteem disturbances. Pt identified triggers via thought record as having skin breakouts or an allergic reaction. Pt continues to blame herself for her allergies.   Patient may benefit from continuing to utilize thought record. LCSWA provided psychoeducation on depression and anxiety. LCSWA encouraged pt to utilize deep breathing exercises, exercising, and positive affirmations. Pt was referred for outpatient [facility] to [facility]. LCSWA will fu with pt on referral.  Plan: Follow up with behavioral health clinician on : 10/30/21 Behavioral recommendations: Utilize  deep breathing exercises, exercise, and utilize positive affirmations. Utilize provided crisis resources if SI arises with plan, means, and intent. Referral(s): Integrated [facility] (In Clinic) "From scale of 1-10, how likely are you to follow plan?": 10  Devyn Sheerin C Veer Elamin, LCSW

## 2021-10-22 DIAGNOSIS — L259 Unspecified contact dermatitis, unspecified cause: Secondary | ICD-10-CM | POA: Diagnosis not present

## 2021-10-23 ENCOUNTER — Other Ambulatory Visit: Payer: Medicaid Other | Admitting: Family

## 2021-10-30 ENCOUNTER — Ambulatory Visit: Payer: Medicaid Other | Admitting: Clinical

## 2021-11-29 DIAGNOSIS — Z419 Encounter for procedure for purposes other than remedying health state, unspecified: Secondary | ICD-10-CM | POA: Diagnosis not present

## 2021-12-18 DIAGNOSIS — Z3200 Encounter for pregnancy test, result unknown: Secondary | ICD-10-CM | POA: Diagnosis not present

## 2021-12-28 ENCOUNTER — Encounter (HOSPITAL_COMMUNITY): Payer: Self-pay | Admitting: Emergency Medicine

## 2021-12-28 ENCOUNTER — Inpatient Hospital Stay (HOSPITAL_COMMUNITY)
Admission: EM | Admit: 2021-12-28 | Discharge: 2021-12-29 | Disposition: A | Discharge status: Home / Self Care | Payer: Medicaid Other | Attending: Obstetrics and Gynecology | Admitting: Obstetrics and Gynecology

## 2021-12-28 ENCOUNTER — Other Ambulatory Visit: Payer: Self-pay

## 2021-12-28 DIAGNOSIS — O26891 Other specified pregnancy related conditions, first trimester: Secondary | ICD-10-CM | POA: Diagnosis not present

## 2021-12-28 DIAGNOSIS — Z3A09 9 weeks gestation of pregnancy: Secondary | ICD-10-CM | POA: Insufficient documentation

## 2021-12-28 DIAGNOSIS — O09521 Supervision of elderly multigravida, first trimester: Secondary | ICD-10-CM | POA: Diagnosis not present

## 2021-12-28 DIAGNOSIS — U071 COVID-19: Secondary | ICD-10-CM | POA: Insufficient documentation

## 2021-12-28 DIAGNOSIS — Z789 Other specified health status: Secondary | ICD-10-CM

## 2021-12-28 DIAGNOSIS — O98511 Other viral diseases complicating pregnancy, first trimester: Secondary | ICD-10-CM | POA: Insufficient documentation

## 2021-12-28 NOTE — ED Triage Notes (Addendum)
Spanish interpreter used during triage. Pt states she is pregnant, and recently tested positive for Covid. States she wants to know if she can take any home OTC meds, but is unsure since she may be pregnant, LMP 10/23/21. Denies any sob or cp, has mild cough

## 2021-12-28 NOTE — ED Provider Notes (Signed)
Emergency Medicine Provider Triage Evaluation Note  Kristen Frank , a 35 y.o. female  was evaluated in triage.  Pt complains of covid sxs. Positive preg test at home, she is 9 weeks preg.   Review of Systems  Positive: Cough, body aches Negative: Chest pain  Physical Exam  BP 110/85    Pulse 99    Temp 98.3 F (36.8 C) (Oral)    Resp 18    Wt 76.2 kg    LMP 10/23/2021    SpO2 100%    BMI 30.73 kg/m  Gen:   Awake, no distress   Resp:  Normal effort  MSK:   Moves extremities without difficulty  Other:    Medical Decision Making  Medically screening exam initiated at 11:47 PM.  Appropriate orders placed.  Kristen Frank was informed that the remainder of the evaluation will be completed by another provider, this initial triage assessment does not replace that evaluation, and the importance of remaining in the ED until their evaluation is complete.  11:47 PM discussed case with Rolita at [facility]U who accepts patient for transfer   Kristen Frank 12/28/21 2349    Geoffery Lyons, MD 12/29/21 [phone]

## 2021-12-29 ENCOUNTER — Other Ambulatory Visit: Payer: Self-pay

## 2021-12-29 ENCOUNTER — Encounter (HOSPITAL_COMMUNITY): Payer: Self-pay | Admitting: Obstetrics and Gynecology

## 2021-12-29 LAB — I-STAT BETA HCG BLOOD, ED (MC, WL, AP ONLY): I-stat hCG, quantitative: >2000 m[IU]/mL — ABNORMAL HIGH (ref <5)

## 2021-12-29 NOTE — Discharge Instructions (Signed)

## 2021-12-29 NOTE — ED Notes (Signed)
Report Alondra RN [facility]U. Transport called.

## 2021-12-29 NOTE — MAU Note (Signed)
..  Kristen Frank is a 35 y.o. at Unknown here in [facility]U reporting: generalized body aches, cough, chills, chest pain, difficulty breathing, feels tired, nausea vomiting. Has vomited 3 times in the past 24hrs. Has not taken medication because she is unaware of what is safe for the baby. [facility]. Denies abdominal pain or vaginal bleeding.  LMP: 10/23/2021 Pain score: body aches 8/10; chest pain 7/10;  Vitals:   12/28/21 2332 12/29/21 0057  BP: 110/85 105/73  Pulse: 99 94  Resp: 18 18  Temp: 98.3 F (36.8 C) 98.3 F (36.8 C)  SpO2: 100% 100%

## 2021-12-29 NOTE — MAU Provider Note (Signed)
Event Date/Time   First Provider Initiated Contact with Patient 12/29/21 0131      S Ms. Kristen Frank is a 35 y.o. [id number] 9 wks 4 days gestation by LMP patient who presents to [facility]U today with complaint of testing (+) for COVID at home and unsure of what OTC meds she can take at home. She reports cough, SOB, cold chills and stuffy nose. She is scheduled to start [facility] with [facility] on 01/08/2022. ** [employer] Video Spanish Interpreter, Suzette Battiest [phone] used for history taking, assessment and verbal discharge instructions  O BP 105/73 (BP Location: Right Arm)    Pulse 94    Temp 98.3 F (36.8 C) (Oral)    Resp 18    Wt 76.2 kg    LMP 10/23/2021    SpO2 100%    BMI 30.73 kg/m  Physical Exam Vitals and nursing note reviewed.  Constitutional:      Appearance: Normal appearance. She is normal weight.  Cardiovascular:     Rate and Rhythm: Tachycardia present.  Pulmonary:     Effort: Pulmonary effort is normal.  Abdominal:     Palpations: Abdomen is soft.  Neurological:     Mental Status: She is alert and oriented to person, place, and time.  Psychiatric:        Mood and Affect: Mood normal.        Behavior: Behavior normal.        Thought Content: Thought content normal.        Judgment: Judgment normal.    A Medical screening exam complete Lab test positive for detection of COVID-19 virus  [redacted] weeks gestation of pregnancy Language barrier affecting health care   P Discharge from MAU in stable condition List of safe medications in pregnancy given  Information provided on how to manage COVID at home, quarantine vs isolation, what to do if you're sick with COVID, and pregnancy and COVID  Warning signs for worsening condition that would warrant emergency follow-up discussed Patient may return to [facility]U as needed for pregnancy issues  Raelyn Mora, CNM 12/29/2021 1:31 AM

## 2021-12-30 DIAGNOSIS — Z419 Encounter for procedure for purposes other than remedying health state, unspecified: Secondary | ICD-10-CM | POA: Diagnosis not present

## 2021-12-30 NOTE — L&D Delivery Note (Signed)
Delivery Note Kristen Frank is a 36 y.o. [phone] at [redacted]w[redacted]d admitted for SROM.   GBS Status: Negative/-- (07/06 0000) Maximum Maternal Temperature: 99.2  Labor course: Initial SVE: 3.5/50/-2. Augmentation with: Pitocin. She then progressed to complete.  ROM: 13h 15m with clear yellow fluid  Birth: At 1950 a viable female was delivered via spontaneous vaginal delivery (Presentation: LOA ). Nuchal cord present: Yes, loose nuchal x1. Nuchal cord left in place through delivery of shoulders and body, which delivered in usual fashion. Infant was then maneuvered to reduce the loose nuchal and was placed directly on mom's abdomen for bonding/skin-to-skin, baby dried and stimulated. Cord clamped x 2 after 1 minute and cut by RN.  Cord blood collected.  The placenta separated spontaneously and delivered via gentle cord traction.  Pitocin infused rapidly IV per protocol.  Fundus firm with massage.   Placenta inspected and appears to be intact with a 3 VC.  Placenta/Cord with the following complications: none .   Perineum, vagina and cervix were carefully inspected and she was noted to have no lacerations.   Sponge and instrument count were correct x2.  Intrapartum complications:  None Anesthesia:  epidural Episiotomy: none Lacerations:  none Suture Repair:  n/a EBL (mL): 53   Infant: APGAR (1 MIN): 8   APGAR (5 MINS): 9   APGAR (10 MINS):    Infant weight: pending  Mom to postpartum.  Baby to Couplet care / Skin to Skin. Placenta to L&D   Plans to Breast and bottlefeed Contraception: tubal ligation at postpartum visit Circumcision: unsure  Note sent to [facility]: N/A, [facility] pt for pp visit.  Raylene Everts, MD 07/17/2022 8:03 PM

## 2022-01-30 DIAGNOSIS — Z419 Encounter for procedure for purposes other than remedying health state, unspecified: Secondary | ICD-10-CM | POA: Diagnosis not present

## 2022-02-11 ENCOUNTER — Inpatient Hospital Stay (HOSPITAL_COMMUNITY)
Admission: AD | Admit: 2022-02-11 | Discharge: 2022-02-12 | Disposition: A | Discharge status: Home / Self Care | Payer: Medicaid Other | Attending: Obstetrics & Gynecology | Admitting: Obstetrics & Gynecology

## 2022-02-11 ENCOUNTER — Encounter (HOSPITAL_COMMUNITY): Payer: Self-pay | Admitting: Obstetrics & Gynecology

## 2022-02-11 ENCOUNTER — Other Ambulatory Visit: Payer: Self-pay

## 2022-02-11 DIAGNOSIS — O09522 Supervision of elderly multigravida, second trimester: Secondary | ICD-10-CM | POA: Insufficient documentation

## 2022-02-11 DIAGNOSIS — O99512 Diseases of the respiratory system complicating pregnancy, second trimester: Secondary | ICD-10-CM | POA: Insufficient documentation

## 2022-02-11 DIAGNOSIS — O99891 Other specified diseases and conditions complicating pregnancy: Secondary | ICD-10-CM

## 2022-02-11 DIAGNOSIS — N393 Stress incontinence (female) (male): Secondary | ICD-10-CM | POA: Insufficient documentation

## 2022-02-11 DIAGNOSIS — Z3492 Encounter for supervision of normal pregnancy, unspecified, second trimester: Secondary | ICD-10-CM

## 2022-02-11 DIAGNOSIS — R059 Cough, unspecified: Secondary | ICD-10-CM

## 2022-02-11 DIAGNOSIS — Z3A16 16 weeks gestation of pregnancy: Secondary | ICD-10-CM | POA: Insufficient documentation

## 2022-02-11 NOTE — MAU Note (Signed)
Kristen Frank is a 36 y.o. at [redacted]w[redacted]d here in [facility]U reporting: Pt has had a cough since yesterday. Today with her cough she started seeing vaginal discharge that has gotten worse. Does not think it's urine. No bleeding. Has taken robitussin for the cough. Has helped a little. Has body aches. No fever. Her three children also have a cough. Had a covid test two days ago, was negative.  LMP:  Onset of complaint: 02/11/22 Pain score: 7/10 Vitals:   02/11/22 2337 02/11/22 2338  BP:  111/70  Pulse:  85  Resp:  20  Temp:  98.8 F (37.1 C)  SpO2: 99% 99%     FHT: Lab orders placed from triage:

## 2022-02-12 DIAGNOSIS — O99512 Diseases of the respiratory system complicating pregnancy, second trimester: Secondary | ICD-10-CM | POA: Diagnosis not present

## 2022-02-12 DIAGNOSIS — N393 Stress incontinence (female) (male): Secondary | ICD-10-CM | POA: Diagnosis not present

## 2022-02-12 DIAGNOSIS — Z3A16 16 weeks gestation of pregnancy: Secondary | ICD-10-CM | POA: Diagnosis not present

## 2022-02-12 DIAGNOSIS — R059 Cough, unspecified: Secondary | ICD-10-CM | POA: Diagnosis not present

## 2022-02-12 DIAGNOSIS — O99891 Other specified diseases and conditions complicating pregnancy: Secondary | ICD-10-CM

## 2022-02-12 DIAGNOSIS — O09522 Supervision of elderly multigravida, second trimester: Secondary | ICD-10-CM | POA: Diagnosis not present

## 2022-02-12 LAB — GC/CHLAMYDIA PROBE AMP (~~LOC~~) NOT AT ARMC
Chlamydia: NEGATIVE
Comment: NEGATIVE
Comment: NORMAL
Neisseria Gonorrhea: NEGATIVE

## 2022-02-12 LAB — WET PREP, GENITAL
Clue Cells Wet Prep HPF POC: NONE SEEN
Sperm: NONE SEEN
Trich, Wet Prep: NONE SEEN
WBC, Wet Prep HPF POC: <10 (ref <10)
Yeast Wet Prep HPF POC: NONE SEEN

## 2022-02-12 LAB — POCT FERN TEST: POCT Fern Test: NEGATIVE

## 2022-02-12 NOTE — MAU Provider Note (Signed)
S Ms. Adriana Mckinsey is a 36 y.o. [phone] pregnant female at [redacted]w[redacted]d who presents to MAU today with complaint of cough (entire family is sick, but Covid negative) and vaginal discharge with cough. Notes she has soaked her underwear repeatedly. Not continually leaking, this only happens when coughing hard. Does not think it is urine. Denies abdominal cramping, vaginal bleeding or increased discharge. No other physical complaints.  Has not started OB care anywhere.  Pertinent items noted in HPI and remainder of comprehensive ROS otherwise negative.   Spanish interpreter present via [employer] video.  O BP 104/73    Pulse 85    Temp 98.8 F (37.1 C) (Oral)    Resp 20    Ht 5\' 2"  (1.575 m)    Wt 164 lb 6.4 oz (74.6 kg)    LMP 10/23/2021    SpO2 98%    BMI 30.07 kg/m  Physical Exam Vitals and nursing note reviewed. Exam conducted with a chaperone present.  Constitutional:      General: She is not in acute distress.    Appearance: Normal appearance. She is not ill-appearing.  HENT:     Head: Normocephalic.     Mouth/Throat:     Mouth: Mucous membranes are moist.  Eyes:     Pupils: Pupils are equal, round, and reactive to light.  Cardiovascular:     Rate and Rhythm: Normal rate and regular rhythm.  Pulmonary:     Effort: Pulmonary effort is normal.     Breath sounds: No wheezing, rhonchi or rales.  Abdominal:     Palpations: Abdomen is soft.     Tenderness: There is no abdominal tenderness.  Genitourinary:    General: Normal vulva.     Vagina: No vaginal discharge.     Comments: Normal external female genitalia, no vaginal discharge or pooling noted. Pt asked to cough while swabs being collected, no leaking noted. Musculoskeletal:        General: Normal range of motion.     Cervical back: Normal range of motion.  Skin:    General: Skin is warm and dry.     Capillary Refill: Capillary refill takes less than 2 seconds.  Neurological:     Mental Status: She is alert and oriented to  person, place, and time.  Psychiatric:        Mood and Affect: Mood normal.        Behavior: Behavior normal.        Thought Content: Thought content normal.        Judgment: Judgment normal.   FHR: 160  Results for orders placed or performed during the hospital encounter of 02/11/22 (from the past 24 hour(s))  Fern Test     Status: Normal   Collection Time: 02/12/22 12:55 AM  Result Value Ref Range   POCT Fern Test Negative = intact amniotic membranes   Wet prep, genital     Status: None   Collection Time: 02/12/22 12:57 AM  Result Value Ref Range   Yeast Wet Prep HPF POC NONE SEEN NONE SEEN   Trich, Wet Prep NONE SEEN NONE SEEN   Clue Cells Wet Prep HPF POC NONE SEEN NONE SEEN   WBC, Wet Prep HPF POC <10 <10   Sperm NONE SEEN    Discussed normalcy of exam and absence of abnormal vaginal discharge with patient. Advised she is likely leaking urine during severe coughing spells. Discussed ways to prevent leakage. Reassurance given.   A Cough in adult Fetal  heart tones present [redacted] weeks gestation of pregnancy  P Discharge from MAU in stable condition with return precautions Advised to begin OB care as soon as possible.  Gaylan Gerold, CNM, MSN, [place] Certified Nurse Midwife, [facility]

## 2022-02-27 DIAGNOSIS — Z419 Encounter for procedure for purposes other than remedying health state, unspecified: Secondary | ICD-10-CM | POA: Diagnosis not present

## 2022-03-14 ENCOUNTER — Other Ambulatory Visit: Payer: Self-pay | Admitting: Nurse Practitioner

## 2022-03-14 DIAGNOSIS — Z23 Encounter for immunization: Secondary | ICD-10-CM | POA: Diagnosis not present

## 2022-03-14 DIAGNOSIS — Z8759 Personal history of other complications of pregnancy, childbirth and the puerperium: Secondary | ICD-10-CM | POA: Diagnosis not present

## 2022-03-14 DIAGNOSIS — Z3482 Encounter for supervision of other normal pregnancy, second trimester: Secondary | ICD-10-CM | POA: Diagnosis not present

## 2022-03-14 DIAGNOSIS — Z113 Encounter for screening for infections with a predominantly sexual mode of transmission: Secondary | ICD-10-CM | POA: Diagnosis not present

## 2022-03-14 DIAGNOSIS — O09512 Supervision of elderly primigravida, second trimester: Secondary | ICD-10-CM | POA: Diagnosis not present

## 2022-03-14 DIAGNOSIS — Z8659 Personal history of other mental and behavioral disorders: Secondary | ICD-10-CM | POA: Diagnosis not present

## 2022-03-14 DIAGNOSIS — A6000 Herpesviral infection of urogenital system, unspecified: Secondary | ICD-10-CM | POA: Diagnosis not present

## 2022-03-14 LAB — OB RESULTS CONSOLE HEPATITIS B SURFACE ANTIGEN: Hepatitis B Surface Ag: NEGATIVE

## 2022-03-14 LAB — OB RESULTS CONSOLE RUBELLA ANTIBODY, IGM: Rubella: IMMUNE

## 2022-03-14 LAB — HEPATITIS C ANTIBODY: HCV Ab: NEGATIVE

## 2022-03-20 ENCOUNTER — Ambulatory Visit: Payer: Medicaid Other | Attending: Nurse Practitioner

## 2022-03-20 ENCOUNTER — Ambulatory Visit: Payer: Medicaid Other

## 2022-03-20 ENCOUNTER — Ambulatory Visit: Payer: Self-pay | Admitting: Genetics

## 2022-03-20 ENCOUNTER — Other Ambulatory Visit: Payer: Self-pay

## 2022-03-20 DIAGNOSIS — O09522 Supervision of elderly multigravida, second trimester: Secondary | ICD-10-CM

## 2022-03-20 DIAGNOSIS — Z3A21 21 weeks gestation of pregnancy: Secondary | ICD-10-CM

## 2022-03-20 NOTE — Progress Notes (Signed)
?Name: Kristen Frank Indication: Advanced Maternal Age  ?DOB: 26-Mar-1986 Age: 36 y.o.   ?EDC: 07/30/2022 LMP: 10/23/2021 Referring Provider:  ?Jolaine Click, NP  ?EGA: [redacted]w[redacted]d Genetic Counselor: ?Staci Righter, MS, CGC  ?OB Hx: [id number] Date of Appointment: 03/20/2022  ?Accompanied by: Spanish interpreter Face to Face Time: 50 Minutes  ? ?Previous Testing Completed: ?CBC from 03/14/2022 reviewed. MCV within normal limits. It is unlikely that Kristen Frank is a beta thalassemia carrier or an alpha thalassemia carrier of the double-gene deletion. Individuals with a normal MCV may be single-gene deletion carriers, but it is unlikely that the current pregnancy would be affected with alpha or beta thalassemia major.  ? ?Medical History:  ?This is Kristen Frank's 4th pregnancy. She has 3 living children. ?Reports she takes prenatal vitamins and tylenol. Reports she took ibuprofen 4 times when she had Covid in December 2022. Genetic counseling encouraged Kristen Frank to refrain from taking more ibuprofen in pregnancy.  ?Denies personal history of diabetes, high blood pressure, thyroid conditions, and seizures. ?Denies bleeding and fevers in this pregnancy. ?Denies using tobacco, alcohol, or street drugs in this pregnancy.  ? ?Family History: A pedigree was created and scanned into Epic under the Media tab. ?Reports her youngest son has Autism.  ?Maternal ethnicity reported as Hispanic and paternal ethnicity reported as Hispanic. ?Denies Ashkenazi Jewish ancestry. ?Family history not remarkable for consanguinity, individuals with birth defects, intellectual disability, multiple spontaneous abortions, still births, or unexplained neonatal death.  ?   ?Genetic Counseling:  ? ?Advanced Maternal Age. With delivery at 51 years, Kristen Frank's second trimester age-related risk to have a pregnancy with Down syndrome is 1/237 and risk to have a pregnancy with any chromosome condition is 1/111. Information about the most common chromosome conditions such as  Down syndrome, Trisomy 58, Trisomy 51, and common sex chromosome aneuploidies was described, as well as specifics about screening vs. diagnostic testing in pregnancy. ? ?Birth Defects. All babies have approximately a 3-5% risk for a birth defect and a majority of these defects cannot be detected through the screening or diagnostic testing listed below. Ultrasound may detect some birth defects, but it may not detect all birth defects. About half of pregnancies with Down syndrome do not show any soft markers on ultrasound. A normal ultrasound does not guarantee a healthy pregnancy. ? ?Previous Child with Autism Spectrum Disorder. Kristen Frank reports her son has a diagnosis of Autism. Autism is a neurological and developmental disorder that affects how people interact with others, communicate, learn, and behave. Autism is known as a "spectrum" disorder because there is wide variation in the type and severity of symptoms people experience. Kristen Frank reports that her son has not been seen by pediatric genetics and has not had any genetic testing for conditions associated with Autism. We reviewed the benefits of having her son evaluated by pediatric genetics and encouraged her to ask her son's pediatrician for a referral to pediatric genetics. We reviewed with Kristen Frank that based on empiric studies examining many families with children with Autism, the recurrence risk when one sibling is affected is approximately 3-10%, however, the recurrence risk may increase if her son has genetic testing and is subsequently diagnosed with a genetic condition. ?  ? ?Testing/Screening Options:  ? ?Amniocentesis. This procedure is available for prenatal diagnosis. Possible procedural difficulties and complications that can arise include maternal infection, cramping, bleeding, fluid leakage, and/or pregnancy loss. The risk for pregnancy loss with an amniocentesis is 1/500. Per the [employer]nec[employer] Chief Operating Officer) Practice  Bulletin 4, all pregnant women should be offered prenatal assessment for aneuploidy by diagnostic testing regardless of maternal age or other risk factors. If indicated, genetic testing that could be ordered on an amniocentesis sample includes a fetal karyotype, fetal microarray, and testing for specific syndromes. ? ?Non-invasive prenatal screening (NIPS). This can screen the pregnancy for aneuploidy involving chromosomes 13, 18, 21, X, and Y. If NIPS results indicate high-risk for a chromosomal aneuploidy, prenatal diagnosis via CVS or amniocentesis would be recommended. A low-risk NIPS result does not ensure an unaffected pregnancy. NIPS does not screen for neural tube defects or other genetic conditions. Per the ACOG Practice Bulletin 12 all pregnant women regardless of age, baseline risk, and number of fetuses should be offered all screening options including NIPS which was previously only offered for singleton high risk pregnancies. ? ?Carrier screening. Per the ACOG Committee Opinion 691, all women who are considering a pregnancy or are currently pregnant should be offered carrier screening for, at minimum, Cystic Fibrosis (CF), Spinal Muscular Atrophy (SMA), and Hemoglobinopathies. The mode of inheritance, clinical manifestations of these conditions, as well as details about testing were reviewed. A negative result on carrier screening reduces the likelihood of being a carrier, however, does not entirely rule out the possibility. If Chrissandra was found to be a carrier for a specific condition, carrier screening for their reproductive partner would be recommended. ?  ?  ?Patient Plan: ? ?Proceed with: Kristen Frank previously had her blood drawn for NIPS (results currently pending through the laboratory [facility]). Today Kristen Frank's blood was drawn for carrier screening for CF, SMA, Alpha Thalassemia, and Beta Hemoglobinopathies (HBasic panel through the laboratory [facility]).  ?Informed consent was obtained. All questions were  answered. ? ?Declined: Amniocentesis  ? ?Thank you for sharing in the care of Kristen Frank with [place].  ?Please do not hesitate to contact us if you have any questions. ? ?Staci Righter, MS, CGC ?Certified Genetic Counselor ?

## 2022-03-26 ENCOUNTER — Telehealth: Payer: Self-pay | Admitting: Genetics

## 2022-03-26 NOTE — Telephone Encounter (Signed)
Kristen Frank was contacted by telephone to review their noninvasive prenatal screening (NIPS) result. The result is low risk, consistent with a female fetus. This screening significantly reduces the risk that the current pregnancy has Down syndrome, Trisomy 22, Trisomy 13, Monosomy X, and Triploidy. Kristen Frank understands that this is a screening and not a diagnostic test. All questions answered. ?

## 2022-03-27 ENCOUNTER — Other Ambulatory Visit: Payer: Self-pay

## 2022-03-30 DIAGNOSIS — Z419 Encounter for procedure for purposes other than remedying health state, unspecified: Secondary | ICD-10-CM | POA: Diagnosis not present

## 2022-04-08 ENCOUNTER — Telehealth: Payer: Self-pay | Admitting: Genetics

## 2022-04-08 NOTE — Telephone Encounter (Signed)
Kristen Frank was contacted by telephone to review their carrier screening results. The results are screen negative for Cystic Fibrosis, Spinal Muscular Atrophy, Alpha Thalassemia, and Beta Hemoglobinopathies. A negative result on carrier screening reduces the likelihood of being a carrier, however, does not entirely rule out the possibility. All questions answered.  ?

## 2022-04-10 ENCOUNTER — Encounter: Payer: Self-pay | Admitting: *Deleted

## 2022-04-10 ENCOUNTER — Ambulatory Visit: Payer: Medicaid Other | Attending: Nurse Practitioner

## 2022-04-10 ENCOUNTER — Ambulatory Visit: Payer: Medicaid Other | Admitting: *Deleted

## 2022-04-10 ENCOUNTER — Ambulatory Visit (HOSPITAL_BASED_OUTPATIENT_CLINIC_OR_DEPARTMENT_OTHER): Payer: Medicaid Other | Admitting: Maternal & Fetal Medicine

## 2022-04-10 VITALS — BP 108/73 | HR 75

## 2022-04-10 DIAGNOSIS — O09522 Supervision of elderly multigravida, second trimester: Secondary | ICD-10-CM | POA: Diagnosis not present

## 2022-04-10 DIAGNOSIS — Z3689 Encounter for other specified antenatal screening: Secondary | ICD-10-CM | POA: Diagnosis not present

## 2022-04-10 DIAGNOSIS — O4442 Low lying placenta NOS or without hemorrhage, second trimester: Secondary | ICD-10-CM | POA: Diagnosis not present

## 2022-04-10 DIAGNOSIS — Z87898 Personal history of other specified conditions: Secondary | ICD-10-CM | POA: Diagnosis not present

## 2022-04-10 NOTE — Progress Notes (Signed)
MFM Brief Note ? ? ?Kristen Frank is a 36 yo G4P3 who is here for a detailed exam with an EDD of 07/30/22 at the request of Kristen Coma, NP ? ?Single intrauterine pregnancy here for a detailed anatomy given that she 35yo. ?Normal anatomy with measurements consistent with dates ?There is good fetal movement and amniotic fluid volume ?Suboptimal views of the fetal anatomy were obtained secondary to fetal position. ? ?Kristen Frank has a low risk NIPS. She had genetic counseling given that her prior child has autisim. ? ?Her prior pregnancy was complicated by low birth weight delivery with preeclampsia.  ? ?I discussed today's appt and recommend she have a follow up growth in the third trimester.  ? ?Lastly, Kristen Frank has a low lying placenta of 1.2 cm from the internal os. We discussed the increased risk for bleeding if she labors with the placental edge so close to the cervix. Given her early gestation I suspect that the placental edge can become > 1.5 cm from the cervical os by the mid third trimester. We will follow up the placental location at 28 and 32 weeks.  ? ?I spent 20 minutes with > 50% in face to face consultation. ? ?Kristen Ports, MD.  ?

## 2022-04-11 ENCOUNTER — Other Ambulatory Visit: Payer: Self-pay

## 2022-04-11 ENCOUNTER — Other Ambulatory Visit: Payer: Self-pay | Admitting: *Deleted

## 2022-04-11 DIAGNOSIS — O09512 Supervision of elderly primigravida, second trimester: Secondary | ICD-10-CM

## 2022-04-11 DIAGNOSIS — Z362 Encounter for other antenatal screening follow-up: Secondary | ICD-10-CM

## 2022-04-11 DIAGNOSIS — Z3492 Encounter for supervision of normal pregnancy, unspecified, second trimester: Secondary | ICD-10-CM

## 2022-04-11 DIAGNOSIS — Z3482 Encounter for supervision of other normal pregnancy, second trimester: Secondary | ICD-10-CM | POA: Diagnosis not present

## 2022-04-15 IMAGING — US US MFM OB DETAIL+14 WK
1 series · 13 of 28 positions shown · non-contrast
Comparison: none

[Series 1: us mfm ob detail+14 wk · 13 of 106 slices shown]
[im 4/106]
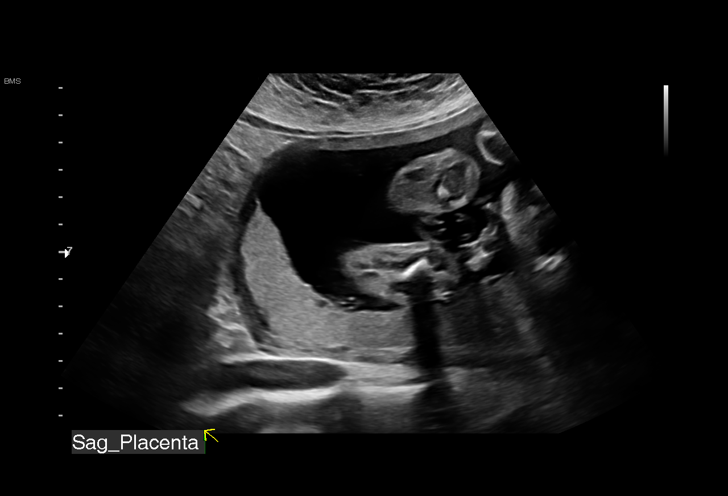
[im 12/106]
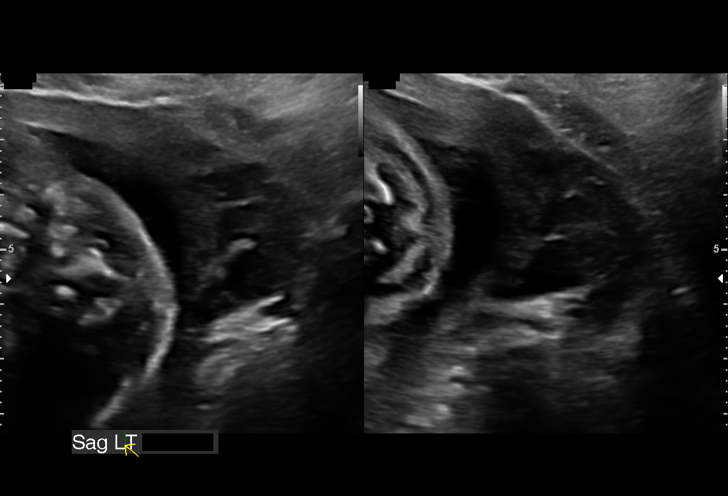
[im 20/106]
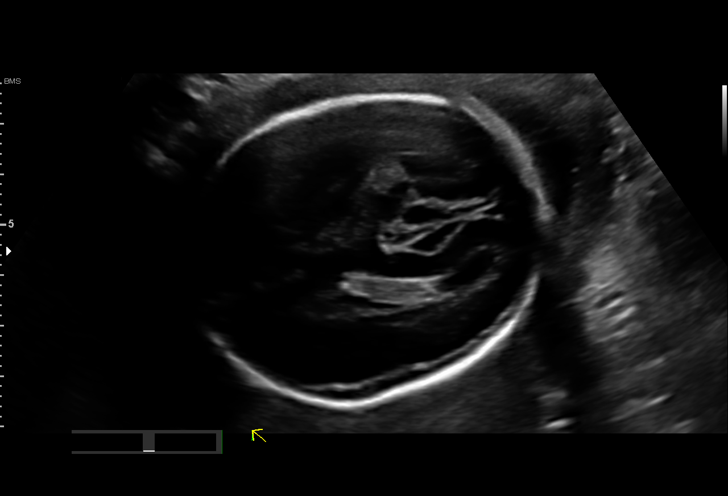
[im 28/106]
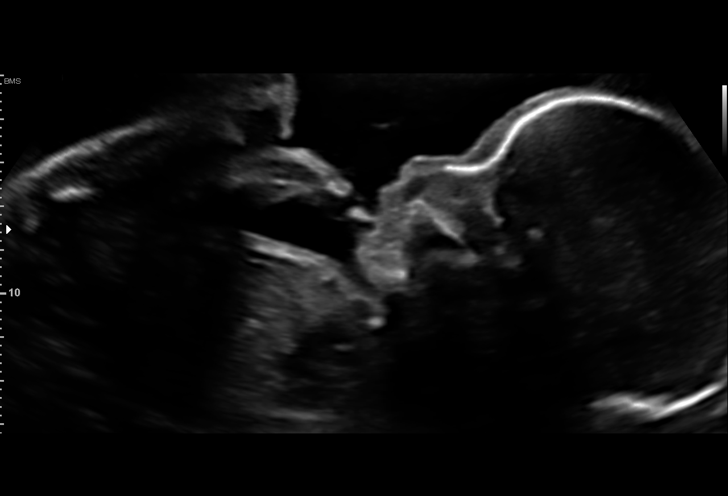
[im 36/106]
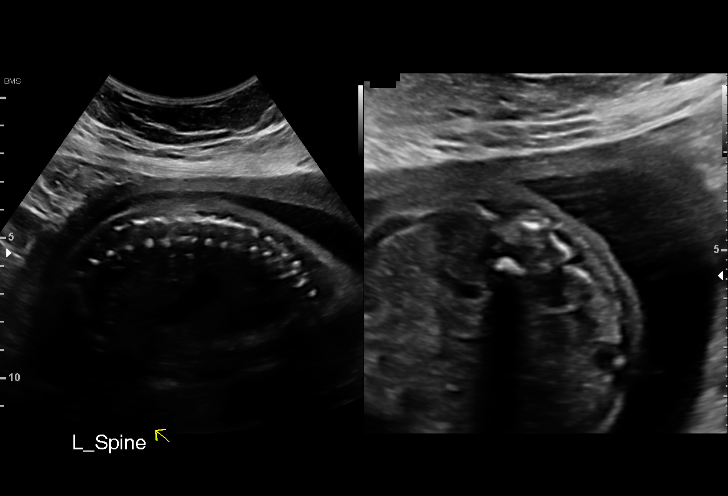
[im 43/106]
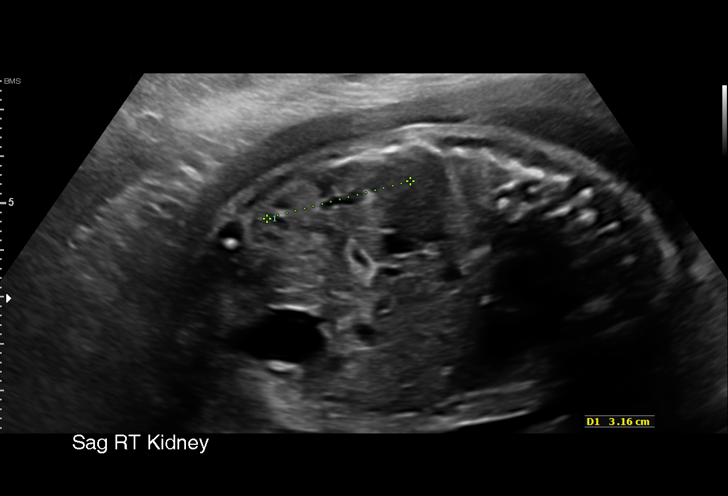
[im 55/106]
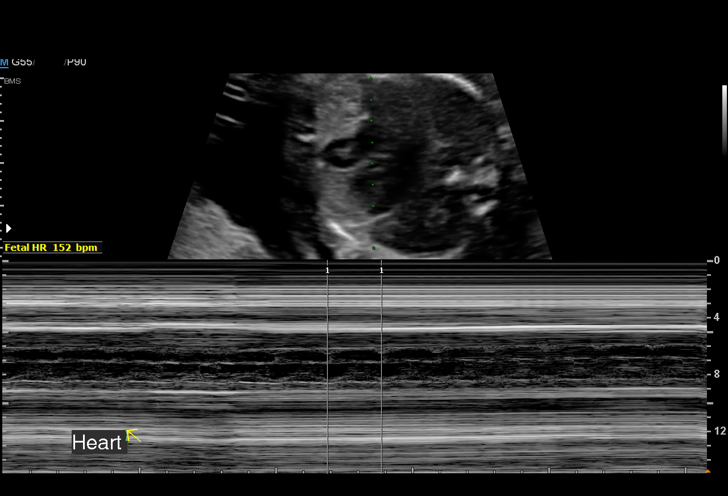
[im 63/106]
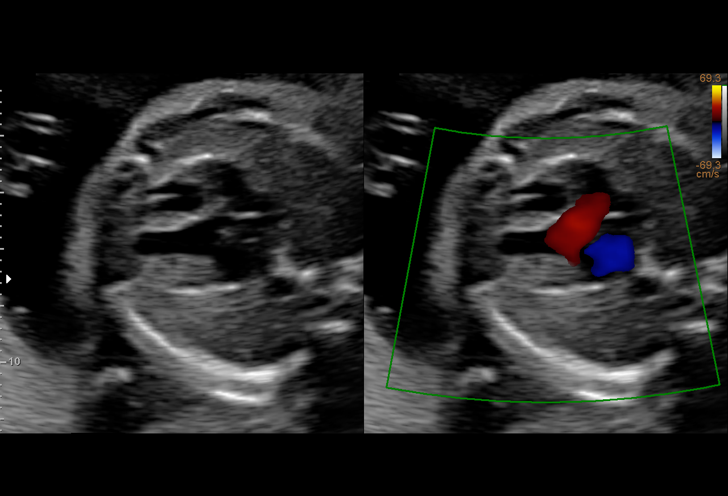
[im 71/106]
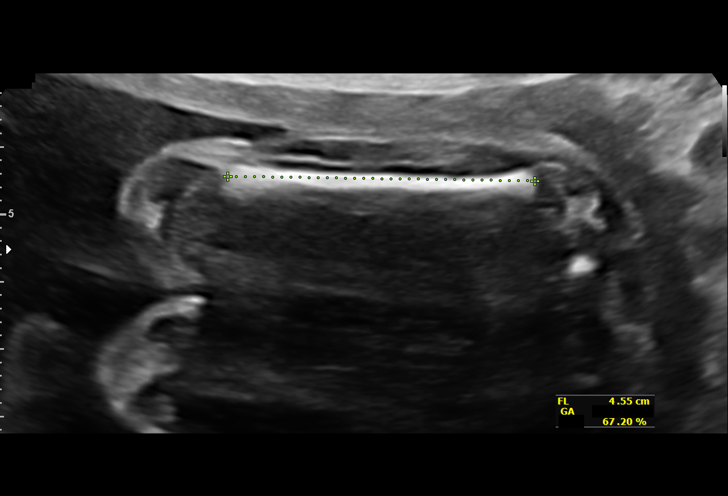
[im 78/106]
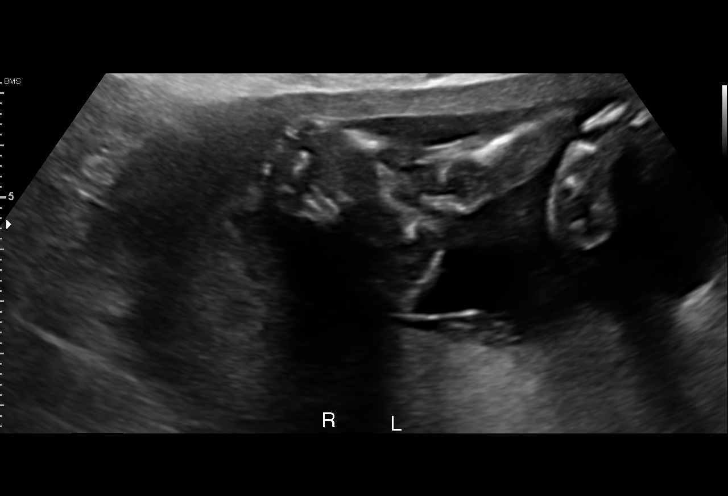
[im 86/106]
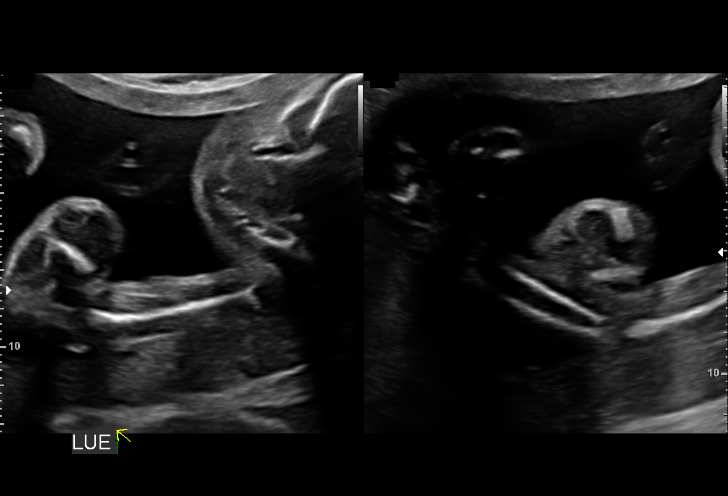
[im 94/106]
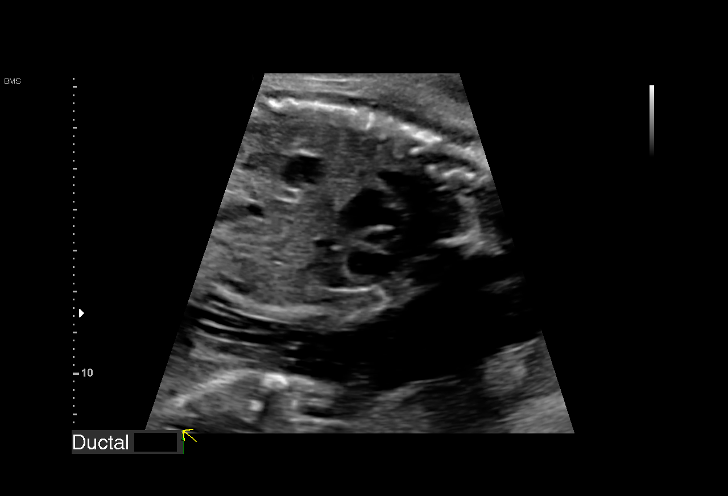
[im 102/106]
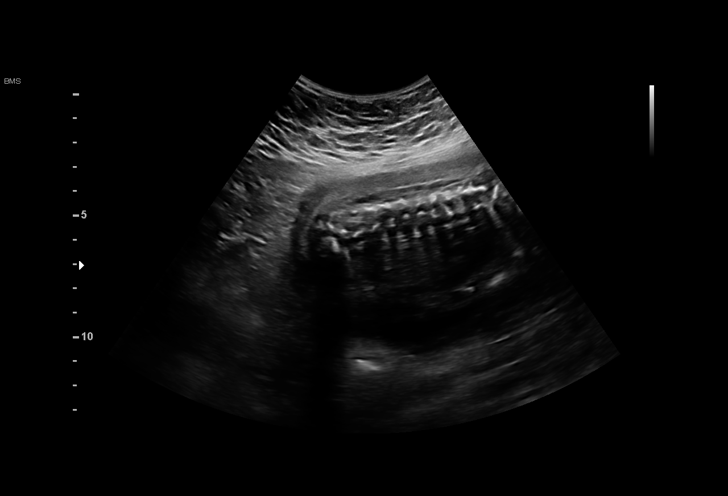

[13 of 28 positions shown; findings below may reference images not displayed]

Indications

 Encounter for antenatal screening for
 malformations
 LR NIPS, Neg Horizon
 Advanced maternal age multigravida 35+,
 second trimester
 Poor obstetric history: Previous
 preeclampsia / eclampsia/gestational HTN
 History of congenital or genetic condition
 (son with autism)
 24 weeks gestation of pregnancy
Fetal Evaluation

 Num Of Fetuses:         1
 Fetal Heart Rate(bpm):  152
 Cardiac Activity:       Observed
 Presentation:           Cephalic
 Placenta:               Posterior, low-lying, 1.2 cm from int os
 P. Cord Insertion:      Visualized, central

 Amniotic Fluid
 AFI FV:      Within normal limits

                             Largest Pocket(cm)

Biometry
 BPD:      59.5  mm     G. Age:  24w 2d         49  %    CI:         73.1   %    70 - 86
                                                         FL/HC:      20.5   %    18.7 -
 HC:      221.2  mm     G. Age:  24w 1d         32  %    HC/AC:      1.08        1.05 -
 AC:      203.9  mm     G. Age:  25w 0d         68  %    FL/BPD:     76.1   %    71 - 87
 FL:       45.3  mm     G. Age:  25w 0d         65  %    FL/AC:      22.2   %    20 - 24
 HUM:      39.9  mm     G. Age:  24w 2d         46  %
 CER:      26.5  mm     G. Age:  23w 6d         54  %

 LV:        3.6  mm
 CM:        6.8  mm

 Est. FW:     744  gm    1 lb 10 oz      75  %
OB History

 Gravidity:    4
 Living:       3
Gestational Age

 LMP:           24w 1d        Date:  10/23/21                 EDD:   07/30/22
 U/S Today:     24w 4d                                        EDD:   07/27/22
 Best:          24w 1d     Det. By:  LMP  (10/23/21)          EDD:   07/30/22
Anatomy

 Cranium:               Appears normal         Aortic Arch:            Appears normal
 Cavum:                 Appears normal         Ductal Arch:            Appears normal
 Ventricles:            Appears normal         Diaphragm:              Appears normal
 Choroid Plexus:        Appears normal         Stomach:                Appears normal, left
                                                                       sided
 Cerebellum:            Appears normal         Abdomen:                Appears normal
 Posterior Fossa:       Appears normal         Abdominal Wall:         Appears nml (cord
                                                                       insert, abd wall)
 Nuchal Fold:           Not applicable (>20    Cord Vessels:           Appears normal (3
                        wks GA)                                        vessel cord)
 Face:                  Appears normal         Kidneys:                Appear normal
                        (orbits and profile)
 Lips:                  Appears normal         Bladder:                Appears normal
 Thoracic:              Appears normal         Spine:                  Ltd views no
                                                                       intracranial signs of
                                                                       NTD
 Heart:                 Not well visualized    Upper Extremities:      Appears normal
 RVOT:                  Not well visualized    Lower Extremities:      Appears normal
 LVOT:                  Appears normal

 Other:  Nasal bone, lenses, maxilla, mandible and falx visualized.3VV,
         IVC/SVC visualized. Heels/feet and 5th digits visualized.
Cervix Uterus Adnexa

 Cervix
 Length:            4.6  cm.
 Not visualized (advanced GA >82wks)

 Uterus
 No abnormality visualized.
 Right Ovary
 Not visualized.

 Left Ovary
 Not visualized.

 Cul De Sac
 No free fluid seen.

 Adnexa
 No adnexal mass visualized.
Impression

 MFM Brief Note

 Ms. Sumaryana is a 35 yo G4P3 who is here for a detailed exam
 with an EDD of 07/30/22 at the request of Chihiro Hatisuka, NP

 Single intrauterine pregnancy here for a detailed anatomy
 given that she 35yo.
 Normal anatomy with measurements consistent with dates
 There is good fetal movement and amniotic fluid volume
 Suboptimal views of the fetal anatomy were obtained
 secondary to fetal position.

 Ms. Sumaryana has a low risk NIPS. She had genetic counseling
 given that her prior child has autisim.

 Her prior pregnancy was complicated by low birth weight
 delivery with preeclampsia.

 I discussed today's appt and recommend she have a follow
 up growth in the third trimester.

 Lastly, Ms. Sumaryana has a low lying placenta of 1.2 cm from the
 internal os. We discussed the increased risk for bleeding if
 she labors with the placental edge so close to the cervix.
 Given her early gestation I suspect that the placental edge
 can become > 1.5 cm from the cervical os by the mid third
 trimester. We will follow up the placental location at 28 and
 32 weeks.

 I spent 30 minutes with > 50% in face to face consultation.

## 2022-04-16 ENCOUNTER — Other Ambulatory Visit: Payer: Self-pay | Admitting: *Deleted

## 2022-04-16 DIAGNOSIS — Z362 Encounter for other antenatal screening follow-up: Secondary | ICD-10-CM

## 2022-04-16 DIAGNOSIS — O99212 Obesity complicating pregnancy, second trimester: Secondary | ICD-10-CM

## 2022-04-16 DIAGNOSIS — O09522 Supervision of elderly multigravida, second trimester: Secondary | ICD-10-CM

## 2022-04-29 DIAGNOSIS — Z419 Encounter for procedure for purposes other than remedying health state, unspecified: Secondary | ICD-10-CM | POA: Diagnosis not present

## 2022-05-08 ENCOUNTER — Encounter: Payer: Self-pay | Admitting: *Deleted

## 2022-05-08 ENCOUNTER — Ambulatory Visit: Payer: Medicaid Other | Admitting: *Deleted

## 2022-05-08 ENCOUNTER — Ambulatory Visit: Payer: Medicaid Other | Attending: Maternal & Fetal Medicine

## 2022-05-08 VITALS — BP 113/68 | HR 83

## 2022-05-08 DIAGNOSIS — Z3A28 28 weeks gestation of pregnancy: Secondary | ICD-10-CM | POA: Diagnosis not present

## 2022-05-08 DIAGNOSIS — O99212 Obesity complicating pregnancy, second trimester: Secondary | ICD-10-CM

## 2022-05-08 DIAGNOSIS — O09523 Supervision of elderly multigravida, third trimester: Secondary | ICD-10-CM

## 2022-05-08 DIAGNOSIS — Z362 Encounter for other antenatal screening follow-up: Secondary | ICD-10-CM | POA: Diagnosis not present

## 2022-05-08 DIAGNOSIS — Z87798 Personal history of other (corrected) congenital malformations: Secondary | ICD-10-CM | POA: Diagnosis not present

## 2022-05-08 DIAGNOSIS — O09522 Supervision of elderly multigravida, second trimester: Secondary | ICD-10-CM | POA: Diagnosis not present

## 2022-05-08 DIAGNOSIS — O09293 Supervision of pregnancy with other poor reproductive or obstetric history, third trimester: Secondary | ICD-10-CM

## 2022-05-08 DIAGNOSIS — E669 Obesity, unspecified: Secondary | ICD-10-CM

## 2022-05-09 DIAGNOSIS — Z3483 Encounter for supervision of other normal pregnancy, third trimester: Secondary | ICD-10-CM | POA: Diagnosis not present

## 2022-05-09 LAB — OB RESULTS CONSOLE HIV ANTIBODY (ROUTINE TESTING): HIV: NONREACTIVE

## 2022-05-09 LAB — OB RESULTS CONSOLE RPR: RPR: NONREACTIVE

## 2022-05-13 IMAGING — US US MFM OB FOLLOW-UP
1 series · 13 of 28 positions shown · non-contrast
Comparison: none

[Series 1: us mfm ob follow-up · 66 acquisitions, 13 frames shown]
[im 3/66]
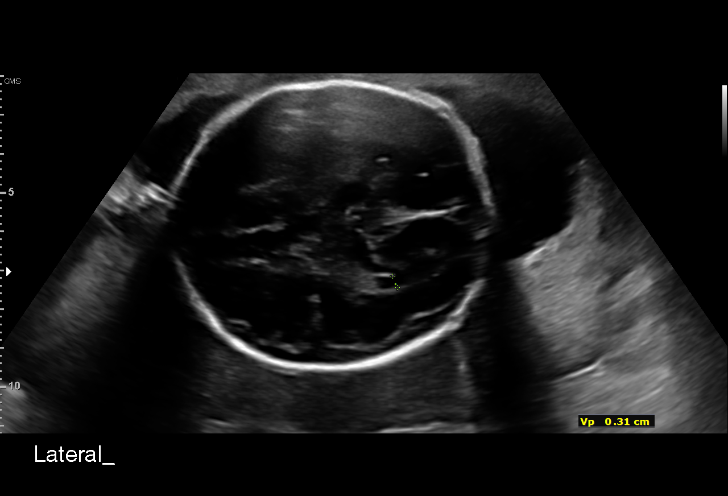
[im 8/66]
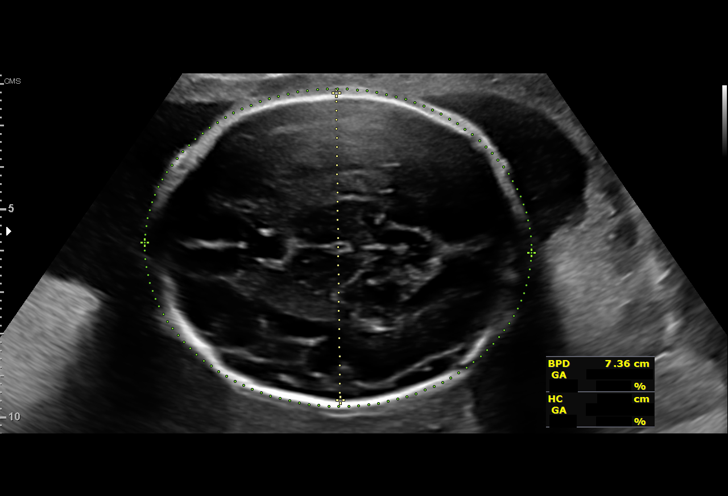
[im 13/66]
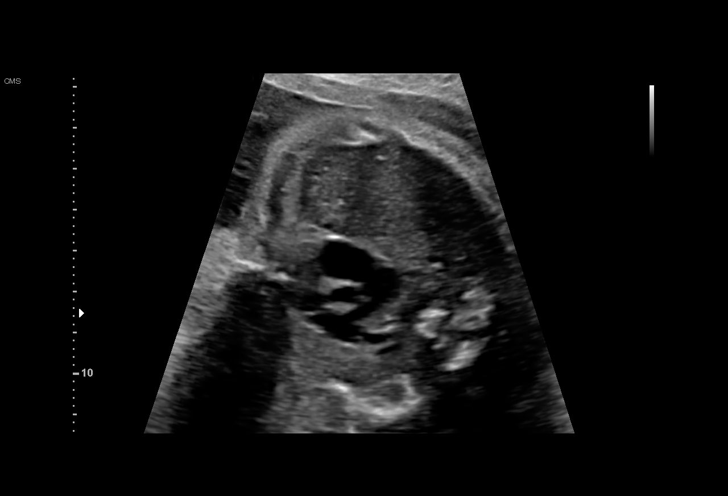
[im 17/66]
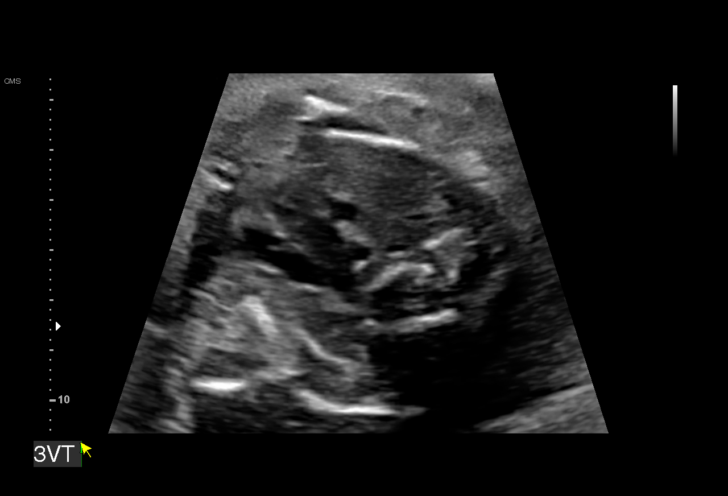
[im 22/66]
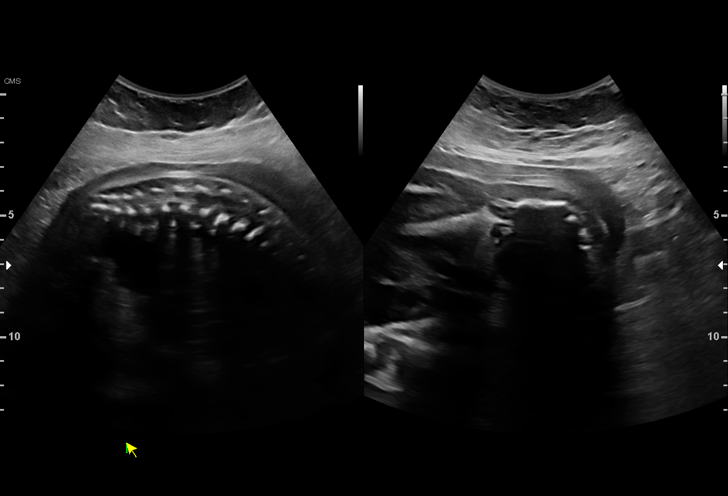
[im 27/66]
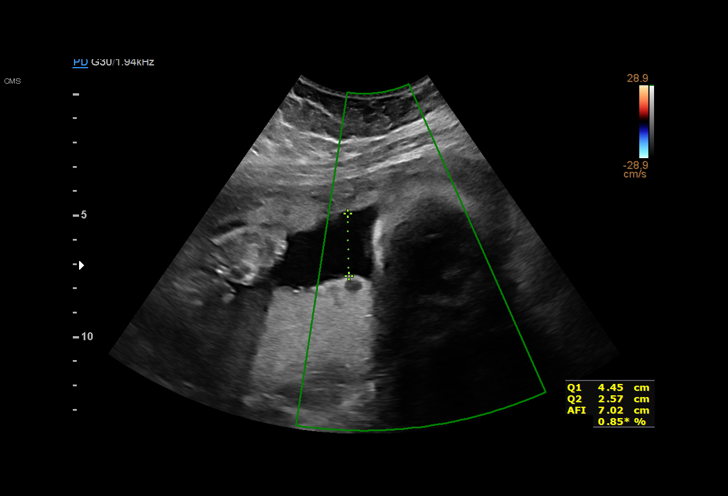
[im 34/66]
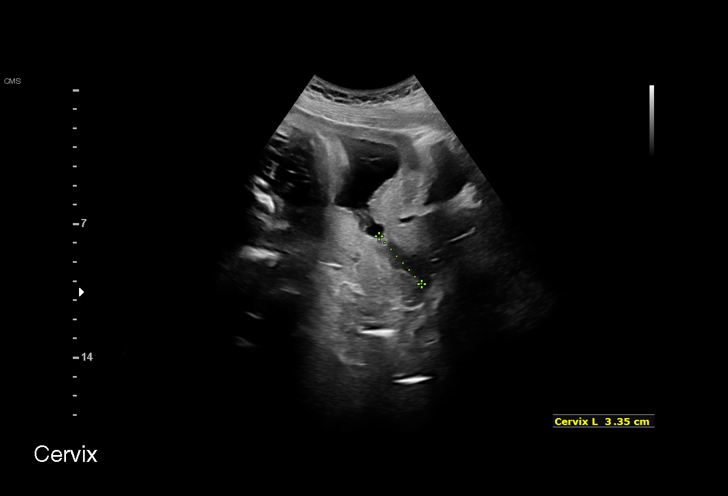
[im 39/66]
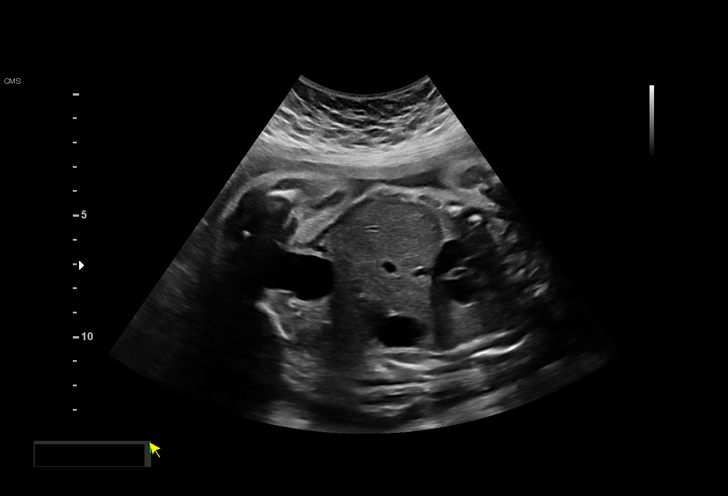
[im 44/66]
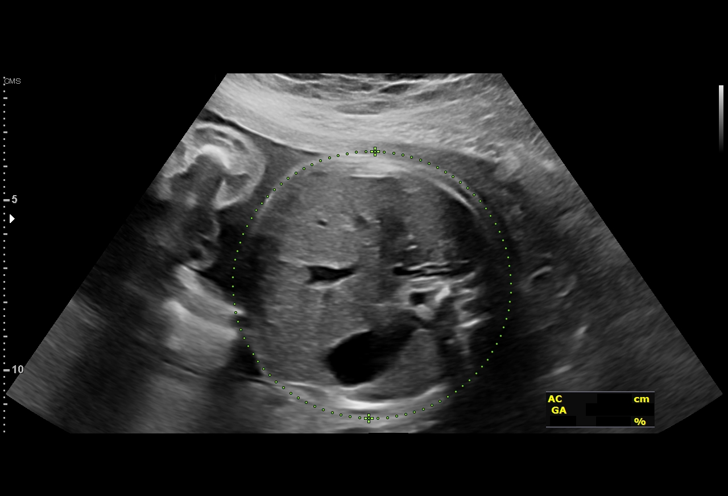
[im 49/66]
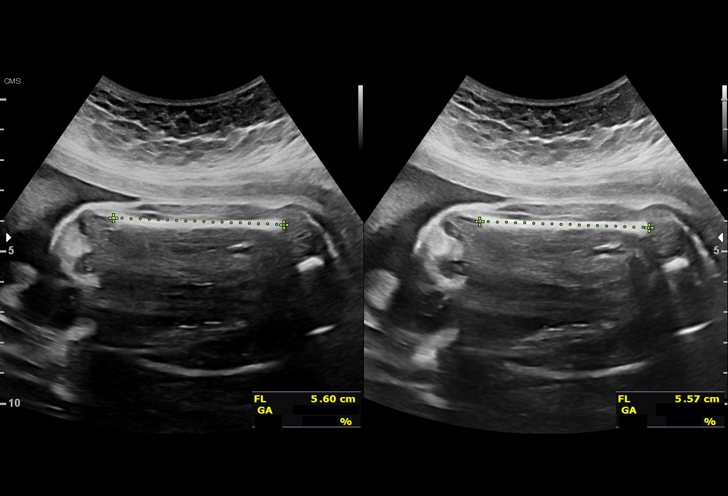
[im 53/66]
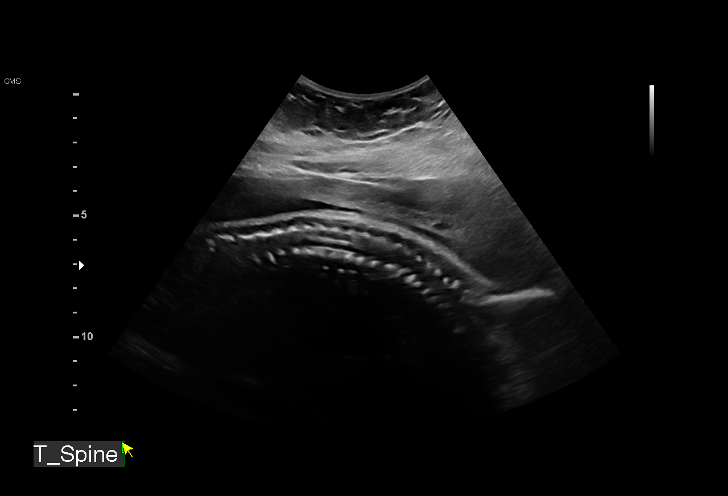
[im 58/66]
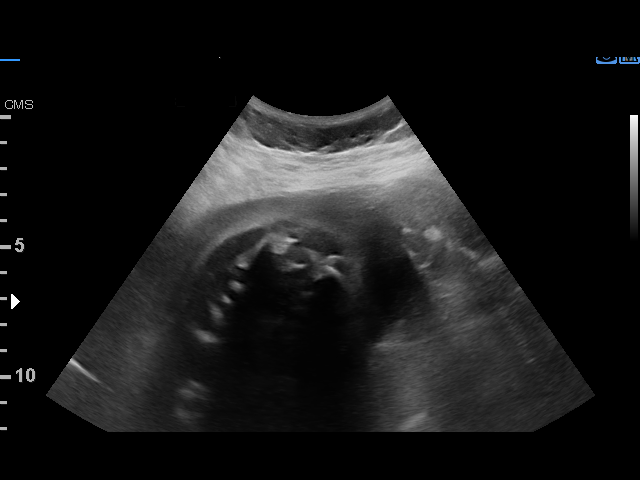
[im 63/66]
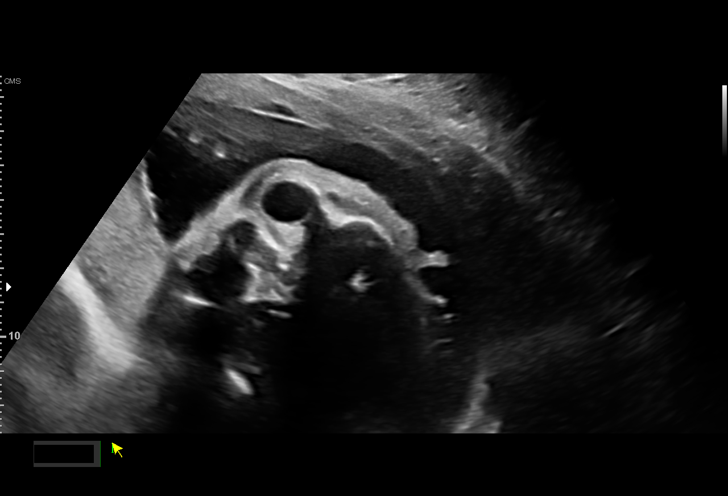

[13 of 28 positions shown; findings below may reference images not displayed]

DANYAL

Indications

 28 weeks gestation of pregnancy
 Encounter for other antenatal screening
 follow-up
 LR NIPS, Neg Horizon
 Advanced maternal age multigravida 35+,
 third trimester
 Poor obstetric history: Previous
 preeclampsia / eclampsia/gestational HTN
 History of congenital or genetic condition
 (son with autism)
Fetal Evaluation

 Num Of Fetuses:         1
 Fetal Heart Rate(bpm):  144
 Cardiac Activity:       Observed
 Presentation:           Cephalic
 Placenta:               Posterior
 P. Cord Insertion:      Previously Visualized

 Amniotic Fluid
 AFI FV:      Within normal limits

 AFI Sum(cm)     %Tile       Largest Pocket(cm)
 15.5            55

 RUQ(cm)       RLQ(cm)       LUQ(cm)        LLQ(cm)

Biometry

 BPD:      73.8  mm     G. Age:  29w 4d         83  %    CI:        77.38   %    70 - 86
                                                         FL/HC:      21.0   %    18.8 -
 HC:      265.6  mm     G. Age:  28w 6d         43  %    HC/AC:      1.06        1.05 -
 AC:      249.8  mm     G. Age:  29w 1d         74  %    FL/BPD:     75.6   %    71 - 87
 FL:       55.8  mm     G. Age:  29w 3d         71  %    FL/AC:      22.3   %    20 - 24
 CER:      32.3  mm     G. Age:  27w 5d         36  %
 LV:        3.1  mm

 Est. FW:    7091  gm           3 lb     79  %
OB History

 Gravidity:    4
 Living:       3
Gestational Age

 LMP:           28w 1d        Date:  10/23/21                 EDD:   07/30/22
 U/S Today:     29w 2d                                        EDD:   07/22/22
 Best:          28w 1d     Det. By:  LMP  (10/23/21)          EDD:   07/30/22
Anatomy

 Cranium:               Appears normal         Aortic Arch:            Previously seen
 Cavum:                 Appears normal         Ductal Arch:            Previously seen
 Ventricles:            Appears normal         Diaphragm:              Appears normal
 Choroid Plexus:        Appears normal         Stomach:                Appears normal, left
                                                                       sided
 Cerebellum:            Appears normal         Abdomen:                Appears normal
 Posterior Fossa:       Appears normal         Abdominal Wall:         Appears nml (cord
                                                                       insert, abd wall)
 Nuchal Fold:           Not applicable (>20    Cord Vessels:           Appears normal (3
                        wks GA)                                        vessel cord)
 Face:                  Orbits and profile     Kidneys:                Appear normal
                        previously seen
 Lips:                  Appears normal         Bladder:                Appears normal
 Thoracic:              Appears normal         Spine:                  Appears normal
 Heart:                 Appears normal         Upper Extremities:      Previously seen
                        (4CH, axis, and
                        situs)
 RVOT:                  Appears normal         Lower Extremities:      Previously seen
 LVOT:                  Previously seen

 Other:  Nasal bone, lenses, maxilla, mandible and falx previously
         visualized.3VTV, 3VV and IVC/SVC visualized. Heels/feet and 5th
         digits previously visualized. Fetus appears to be a male.
Cervix Uterus Adnexa

 Cervix
 Length:           3.35  cm.
 Normal appearance by transabdominal scan.

 Uterus
 No abnormality visualized.
 Right Ovary
 Not visualized.

 Left Ovary
 Not visualized.

 Cul De Sac
 No free fluid seen.

 Adnexa
 No abnormality visualized.
Impression

 Follow up growth due to clear the fetal anatomy and assess
 the placental location.
 Normal interval growth with measurements consistent with
 dates
 Good fetal movement and amniotic fluid volume

 The anatomy is clear and there is no longer a low lying
 placenta.

 At this time no further ultrasounds are scheduled. We
 canceled the growth at 32 weeks given that a low lying
 placenta is now cleared.
Recommendations

 Follow up growth as clinically indicated.

## 2022-05-28 ENCOUNTER — Inpatient Hospital Stay (HOSPITAL_COMMUNITY)
Admission: AD | Admit: 2022-05-28 | Discharge: 2022-05-28 | Disposition: A | Discharge status: Home / Self Care | Payer: Medicaid Other | Attending: Obstetrics and Gynecology | Admitting: Obstetrics and Gynecology

## 2022-05-28 ENCOUNTER — Other Ambulatory Visit: Payer: Self-pay

## 2022-05-28 ENCOUNTER — Encounter (HOSPITAL_COMMUNITY): Payer: Self-pay | Admitting: Obstetrics and Gynecology

## 2022-05-28 DIAGNOSIS — R102 Pelvic and perineal pain: Secondary | ICD-10-CM | POA: Diagnosis not present

## 2022-05-28 DIAGNOSIS — W109XXA Fall (on) (from) unspecified stairs and steps, initial encounter: Secondary | ICD-10-CM | POA: Insufficient documentation

## 2022-05-28 DIAGNOSIS — O26893 Other specified pregnancy related conditions, third trimester: Secondary | ICD-10-CM | POA: Insufficient documentation

## 2022-05-28 DIAGNOSIS — Z3A31 31 weeks gestation of pregnancy: Secondary | ICD-10-CM | POA: Insufficient documentation

## 2022-05-28 DIAGNOSIS — O09523 Supervision of elderly multigravida, third trimester: Secondary | ICD-10-CM | POA: Insufficient documentation

## 2022-05-28 DIAGNOSIS — Z3689 Encounter for other specified antenatal screening: Secondary | ICD-10-CM

## 2022-05-28 DIAGNOSIS — Y939 Activity, unspecified: Secondary | ICD-10-CM | POA: Diagnosis not present

## 2022-05-28 DIAGNOSIS — W108XXA Fall (on) (from) other stairs and steps, initial encounter: Secondary | ICD-10-CM

## 2022-05-28 NOTE — MAU Note (Addendum)
.  Kristen Frank is a 36 y.o. at [redacted]w[redacted]d here in MAU reporting a fall at 1630. Pt was going down one step and tripped over a water hose. She fell on her R side and did not hit her abdomen. Reports good FM and denies LOF or VB. Scraped R elbow. Having some pain in LLQ but very mild Onset of complaint: 1430 Pain score: 2 Vitals:   05/28/22 2018 05/28/22 2020  BP:  103/64  Pulse: 76   Resp: 16   Temp: 98.6 F (37 C)   SpO2: 100%      FHT:144 Lab orders placed from triage: u/a

## 2022-05-28 NOTE — MAU Provider Note (Cosign Needed Addendum)
History     CSN: [id number]  Arrival date and time: 05/28/22 2003   Event Date/Time   First Provider Initiated Contact with Patient 05/28/22 2130      Chief Complaint  Patient presents with   [name] is a 36 y.o. G4P3003 at [redacted]w[redacted]d who receives care at [facility] appointment is June 8th.  She presents today, alone, for Fall.  Patient states that at 4:30 PM she fell off the stairs onto her right side.  She reports scraping her right elbow but denies other trauma.  She endorses fetal movement and denies vaginal concerns including bleeding or discharge.  She does report some intermittent "mild" left side abdominal pain, but "does not think it is a cause cause for concern."  She goes on to state that the abdominal pain last approximately 30 seconds and occurs mostly with movements.      OB History     Gravida  4   Para  3   Term  3   Preterm  0   AB  0   Living  3      SAB  0   IAB  0   Ectopic  0   Multiple      Live Births  3           Past Medical History:  Diagnosis Date   GERD (gastroesophageal reflux disease)    History of depression    HSV-2 infection    Idiopathic urticaria 07/26/2021    Past Surgical History:  Procedure Laterality Date   No past surgery      Family History  Problem Relation Age of Onset   Kidney disease Maternal Uncle    Cervical cancer Maternal Grandmother    Cancer Other    Hypertension Other    Diabetes Other    Heart disease Neg Hx     Social History   Tobacco Use   Smoking status: Never   Smokeless tobacco: Never  Vaping Use   Vaping Use: Never used  Substance Use Topics   Alcohol use: No   Drug use: No    Allergies:  Allergies  Allergen Reactions   Gold Sodium Thiosulfate    Nickel     Medications Prior to Admission  Medication Sig Dispense Refill Last Dose   Crisaborole (EUCRISA) 2 % OINT twice a day as needed to red, itchy areas on your hands 100 g 2 Past Week    Prenatal Vit-Fe Fumarate-FA (PRENATAL MULTIVITAMIN) TABS tablet Take 1 tablet by mouth daily at 12 noon.   05/28/2022   triamcinolone ointment (KENALOG) 0.1 % Apply to red itchy areas below face. 45 g 3 Past Week   cetirizine (ZYRTEC) 10 MG tablet Take 10 mg by mouth daily. (Patient not taking: Reported on 04/10/2022)      EPINEPHrine (EPIPEN 2-PAK) 0.3 mg/0.3 mL IJ SOAJ injection Inject 0.3 mg into the muscle once as needed for up to 1 dose (for severe allergic reaction). CAll 911 immediately if you have to use this medicine 1 each 1    famotidine (PEPCID) 40 MG tablet Take 40 mg by mouth daily. (Patient not taking: Reported on 04/10/2022)      omeprazole (PRILOSEC) 20 MG capsule Take 1 capsule (20 mg total) by mouth in the morning and at bedtime. Take one capsule 30-60 minutes before breakfast and dinner. (Patient not taking: Reported on 04/10/2022) 60 capsule 3     Review of Systems  Respiratory:  Negative for chest tightness and shortness of breath.   Cardiovascular:  Negative for chest pain.  Gastrointestinal:  Positive for abdominal pain. Negative for diarrhea, nausea and vomiting.  Genitourinary:  Negative for difficulty urinating, dysuria, vaginal bleeding and vaginal discharge.  Neurological:  Negative for dizziness, light-headedness and headaches.  Physical Exam   Blood pressure 106/64, pulse 80, temperature 98.6 F (37 C), resp. rate 16, height 5' (1.524 m), weight 77.6 kg, last menstrual period 10/23/2021, SpO2 100 %, unknown if currently breastfeeding.  Physical Exam Vitals reviewed.  Constitutional:      Appearance: Normal appearance.  HENT:     Head: Normocephalic and atraumatic.  Eyes:     Conjunctiva/sclera: Conjunctivae normal.  Cardiovascular:     Rate and Rhythm: Normal rate.     Heart sounds: Normal heart sounds.  Pulmonary:     Effort: Pulmonary effort is normal. No respiratory distress.  Abdominal:     Palpations: Abdomen is soft.     Tenderness: There is no  abdominal tenderness.  Musculoskeletal:        General: Normal range of motion.     Cervical back: Normal range of motion.  Skin:    General: Skin is warm and dry.  Neurological:     Mental Status: She is alert and oriented to person, place, and time.  Psychiatric:        Mood and Affect: Mood normal.        Behavior: Behavior normal.    Fetal Assessment 140 bpm, Mod Var, -Decels, +Accels Toco: Irregular   MAU Course  No results found for this or any previous visit (from the past 24 hour(s)). No results found.  MDM PE Labs: None EFM  Assessment and Plan  36 year old G4P3003  SIUP at 31 weeks Cat I FT S/P Fall Round Ligament Pain Language Barrier  -POC discussed. -Exam performed. -Informed that will monitor for ~2 hours. -Reassured that she is correct and pain she is experiencing is likely RLP.  -Patient agreeable with plan and without questions.  -NST reactive -Interpretations completed with assistance of video interpreter: Brayton Layman [phone].   Maryann Conners MSN, CNM 05/28/2022, 9:30 PM   Reassessment (10:38 PM)  -Per nurse, patient reports continued minimal discomfort. -NST remains reactive. -Encouraged to call primary office or return to MAU if symptoms worsen or with the onset of new symptoms. -Discharged to home in stable condition.  Maryann Conners MSN, CNM Advanced Practice Provider, [facility]

## 2022-05-30 DIAGNOSIS — Z419 Encounter for procedure for purposes other than remedying health state, unspecified: Secondary | ICD-10-CM | POA: Diagnosis not present

## 2022-06-05 ENCOUNTER — Ambulatory Visit: Payer: Medicaid Other

## 2022-06-11 DIAGNOSIS — Z789 Other specified health status: Secondary | ICD-10-CM | POA: Diagnosis not present

## 2022-06-11 DIAGNOSIS — O2613 Low weight gain in pregnancy, third trimester: Secondary | ICD-10-CM | POA: Diagnosis not present

## 2022-06-11 DIAGNOSIS — A6000 Herpesviral infection of urogenital system, unspecified: Secondary | ICD-10-CM | POA: Diagnosis not present

## 2022-06-11 DIAGNOSIS — O09523 Supervision of elderly multigravida, third trimester: Secondary | ICD-10-CM | POA: Diagnosis not present

## 2022-06-11 DIAGNOSIS — Z3483 Encounter for supervision of other normal pregnancy, third trimester: Secondary | ICD-10-CM | POA: Diagnosis not present

## 2022-06-11 DIAGNOSIS — O26843 Uterine size-date discrepancy, third trimester: Secondary | ICD-10-CM | POA: Diagnosis not present

## 2022-06-11 DIAGNOSIS — Z23 Encounter for immunization: Secondary | ICD-10-CM | POA: Diagnosis not present

## 2022-06-11 DIAGNOSIS — Z8759 Personal history of other complications of pregnancy, childbirth and the puerperium: Secondary | ICD-10-CM | POA: Diagnosis not present

## 2022-06-11 DIAGNOSIS — Z113 Encounter for screening for infections with a predominantly sexual mode of transmission: Secondary | ICD-10-CM | POA: Diagnosis not present

## 2022-06-11 DIAGNOSIS — Z8659 Personal history of other mental and behavioral disorders: Secondary | ICD-10-CM | POA: Diagnosis not present

## 2022-06-12 ENCOUNTER — Other Ambulatory Visit: Payer: Self-pay | Admitting: Obstetrics & Gynecology

## 2022-06-12 DIAGNOSIS — Z363 Encounter for antenatal screening for malformations: Secondary | ICD-10-CM

## 2022-06-26 ENCOUNTER — Ambulatory Visit: Payer: Medicaid Other

## 2022-06-27 ENCOUNTER — Other Ambulatory Visit: Payer: Self-pay

## 2022-06-27 ENCOUNTER — Encounter (HOSPITAL_COMMUNITY): Payer: Self-pay | Admitting: Obstetrics & Gynecology

## 2022-06-27 ENCOUNTER — Inpatient Hospital Stay (HOSPITAL_COMMUNITY)
Admission: AD | Admit: 2022-06-27 | Discharge: 2022-06-27 | Disposition: A | Discharge status: Home / Self Care | Payer: Medicaid Other | Attending: Obstetrics & Gynecology | Admitting: Obstetrics & Gynecology

## 2022-06-27 DIAGNOSIS — O09523 Supervision of elderly multigravida, third trimester: Secondary | ICD-10-CM | POA: Insufficient documentation

## 2022-06-27 DIAGNOSIS — Z3689 Encounter for other specified antenatal screening: Secondary | ICD-10-CM

## 2022-06-27 DIAGNOSIS — Z3A35 35 weeks gestation of pregnancy: Secondary | ICD-10-CM | POA: Diagnosis not present

## 2022-06-27 DIAGNOSIS — M549 Dorsalgia, unspecified: Secondary | ICD-10-CM

## 2022-06-27 DIAGNOSIS — O26893 Other specified pregnancy related conditions, third trimester: Secondary | ICD-10-CM | POA: Insufficient documentation

## 2022-06-27 DIAGNOSIS — O99891 Other specified diseases and conditions complicating pregnancy: Secondary | ICD-10-CM | POA: Insufficient documentation

## 2022-06-27 DIAGNOSIS — K5900 Constipation, unspecified: Secondary | ICD-10-CM | POA: Diagnosis not present

## 2022-06-27 DIAGNOSIS — O99613 Diseases of the digestive system complicating pregnancy, third trimester: Secondary | ICD-10-CM | POA: Insufficient documentation

## 2022-06-27 LAB — URINALYSIS, ROUTINE W REFLEX MICROSCOPIC
Bilirubin Urine: NEGATIVE
Glucose, UA: NEGATIVE mg/dL
Hgb urine dipstick: NEGATIVE
Ketones, ur: NEGATIVE mg/dL
Leukocytes,Ua: NEGATIVE
Nitrite: NEGATIVE
Protein, ur: NEGATIVE mg/dL
Specific Gravity, Urine: 1.018 (ref 1.005–1.030)
pH: 6 (ref 5.0–8.0)

## 2022-06-27 MED ORDER — ACETAMINOPHEN 500 MG PO TABS: 1000.0000 mg | ORAL_TABLET | Freq: Four times a day (QID) | ORAL | 0 refills | Status: DC | PRN

## 2022-06-27 MED ORDER — POLYETHYLENE GLYCOL 3350 17 G PO PACK: 17.0000 g | PACK | Freq: Every day | ORAL | 2 refills | Status: DC | PRN

## 2022-06-27 MED ORDER — CYCLOBENZAPRINE HCL 10 MG PO TABS: 10.0000 mg | ORAL_TABLET | Freq: Three times a day (TID) | ORAL | 2 refills | Status: DC | PRN

## 2022-06-27 MED ORDER — SENNOSIDES-DOCUSATE SODIUM 8.6-50 MG PO TABS: 2.0000 | ORAL_TABLET | Freq: Two times a day (BID) | ORAL | 2 refills | Status: DC | PRN

## 2022-06-27 NOTE — MAU Note (Signed)
.  Kristen Frank is a 36 y.o. at [redacted]w[redacted]d here in MAU reporting: Pt states that she has been constipated for the past two days. All she has taken for it is fiber and papaya water. At around 0030 today Pt states that she lost her mucous plug due to possible straining. Report watery discharge. Has been having ongoing back and leg pain since fall that occurred on 05/28/22. Pt denies DFM, VB, no recent intercourse.  Pt reports placenta previa. Pt was not able to make last appt.  Onset of complaint: 0030 Pain score: 8/10 leg pain. 7/10 back pain.  Vitals:   06/27/22 0150  BP: 119/87  Pulse: 89  Resp: 16  Temp: 99 F (37.2 C)    FHT 155 Lab orders placed from triage:  UA.

## 2022-06-27 NOTE — MAU Provider Note (Signed)
Obstetric Attending MAU Note  Chief Complaint:  Constipation   Event Date/Time   First Provider Initiated Contact with Patient 06/27/22 0250    Patient is Spanish-speaking only,  AMN interpreter present for this encounter.  HPI: Kristen Frank is a 36 y.o. [phone] at [redacted]w[redacted]d who presents to maternity admissions reporting constipation.  Had last normal BM on 06/22/2022, but had a little BM today.  Has taken Papaya juice today, no relief.  Also worried that she lost her mucus plug when going to the bathroom. Reports lower back pain and leg pain as pregnancy has gotten bigger.  Denies contractions, leakage of fluid or vaginal bleeding. Good fetal movement.   Pregnancy Course: Receives care at [facility] Patient Active Problem List   Diagnosis Date Noted   Allergic reaction to insect sting 07/26/2021   Food intolerance 07/26/2021   Idiopathic urticaria 07/26/2021   Chronic rhinitis 07/26/2021   Allergic contact dermatitis due to other agents 01/22/2021   MDD (major depressive disorder), single episode, severe , no psychosis (HCC) 01/18/2019    Past Medical History:  Diagnosis Date   GERD (gastroesophageal reflux disease)    History of depression    HSV-2 infection    Idiopathic urticaria 07/26/2021   Preeclampsia 04/20/2012    OB History  Gravida Para Term Preterm AB Living  4 3 3  0 0 3  SAB IAB Ectopic Multiple Live Births  0 0 0   3    # Outcome Date GA Lbr Len/2nd Weight Sex Delivery Anes PTL Lv  4 Current           3 Term 02/09/16 [redacted]w[redacted]d 13:25 / 00:16 3291 g M Vag-Spont EPI  LIV  2 Term 03/08/15 [redacted]w[redacted]d 02:04 / 00:48 3200 g M Vag-Spont EPI  LIV     Complications: Preeclampsia  1 Term 04/19/12 [redacted]w[redacted]d 28:27 / 00:26 2510 g F Vag-Spont   LIV    Past Surgical History:  Procedure Laterality Date   No past surgery      Family History: Family History  Problem Relation Age of Onset   Kidney disease Maternal Uncle    Cervical cancer Maternal Grandmother    Cancer Other    Hypertension  Other    Diabetes Other    Heart disease Neg Hx     Social History: Social History   Tobacco Use   Smoking status: Never   Smokeless tobacco: Never  Vaping Use   Vaping Use: Never used  Substance Use Topics   Alcohol use: No   Drug use: No    Allergies:  Allergies  Allergen Reactions   Gold Sodium Thiosulfate    Nickel    Other     Seafood, Lidocaine, Gold, Red meat,lentils,eggs, pecans    Medications Prior to Admission  Medication Sig Dispense Refill Last Dose   Prenatal Vit-Fe Fumarate-FA (PRENATAL MULTIVITAMIN) TABS tablet Take 1 tablet by mouth daily at 12 noon.   06/26/2022   Crisaborole (EUCRISA) 2 % OINT twice a day as needed to red, itchy areas on your hands 100 g 2    EPINEPHrine (EPIPEN 2-PAK) 0.3 mg/0.3 mL IJ SOAJ injection Inject 0.3 mg into the muscle once as needed for up to 1 dose (for severe allergic reaction). CAll 911 immediately if you have to use this medicine 1 each 1    triamcinolone ointment (KENALOG) 0.1 % Apply to red itchy areas below face. 45 g 3 More than a month    ROS: Pertinent findings in history of  present illness.  Physical Exam  Blood pressure 111/78, pulse 72, temperature 99 F (37.2 C), temperature source Oral, resp. rate 17, height 5\' 2"  (1.575 m), weight 76 kg, last menstrual period 10/23/2021, SpO2 97 %, unknown if currently breastfeeding. CONSTITUTIONAL: Well-developed, well-nourished female in no acute distress.  HENT:  Normocephalic, atraumatic, External right and left ear normal. Oropharynx is clear and moist EYES: Conjunctivae and EOM are normal. Pupils are equal, round, and reactive to light. No scleral icterus.  NECK: Normal range of motion, supple, no masses SKIN: Skin is warm and dry. No rash noted. Not diaphoretic. No erythema. No pallor. NEUROLGIC: Alert and oriented to person, place, and time. Normal reflexes, muscle tone coordination. No cranial nerve deficit noted. PSYCHIATRIC: Normal mood and affect. Normal behavior.  Normal judgment and thought content. CARDIOVASCULAR: Normal heart rate noted, regular rhythm RESPIRATORY: Effort and breath sounds normal, no problems with respiration noted ABDOMEN: Soft, nontender, nondistended, gravid appropriate for gestational age MUSCULOSKELETAL: Normal range of motion. No edema and no tenderness. 2+ distal pulses.  SPECULUM EXAM: NEFG, physiologic discharge, no blood, cervix clean Dilation: Closed Effacement (%): Thick Exam by:: Dr. [email or web address]  FHT:  Baseline 140 , moderate variability, accelerations present, no decelerations Contractions: none   Labs: Results for orders placed or performed during the hospital encounter of 06/27/22 (from the past 24 hour(s))  Urinalysis, Routine w reflex microscopic     Status: None   Collection Time: 06/27/22  2:23 AM  Result Value Ref Range   Color, Urine YELLOW YELLOW   APPearance CLEAR CLEAR   Specific Gravity, Urine 1.018 1.005 - 1.030   pH 6.0 5.0 - 8.0   Glucose, UA NEGATIVE NEGATIVE mg/dL   Hgb urine dipstick NEGATIVE NEGATIVE   Bilirubin Urine NEGATIVE NEGATIVE   Ketones, ur NEGATIVE NEGATIVE mg/dL   Protein, ur NEGATIVE NEGATIVE mg/dL   Nitrite NEGATIVE NEGATIVE   Leukocytes,Ua NEGATIVE NEGATIVE    Imaging:  No results found.  MAU Course: Reactive NST noted, no contractions  Assessment: 1. Constipation during pregnancy in third trimester   2. Back pain affecting pregnancy in third trimester   3. NST (non-stress test) reactive   4. [redacted] weeks gestation of pregnancy     Plan: Miralax and Senokot prescribed for constipation. Encouraged increased water intake, fiber intake. Tylenol recommended for back pain, Flexeril also ordered as needed. Reassuring FHR tracing Reassured about closed cervical exam, no need to worry about mucus plug loss.  Preterm labor precautions and fetal kick counts reviewed. Follow up with [facility] as scheduled Discharge home   Allergies as of 06/27/2022       Reactions   Gold Sodium  Thiosulfate    Nickel    Other    Seafood, Lidocaine, Gold, Red meat,lentils,eggs, pecans        Medication List     TAKE these medications    acetaminophen 500 MG tablet Commonly known as: TYLENOL Take 2 tablets (1,000 mg total) by mouth every 6 (six) hours as needed for mild pain or moderate pain.   cyclobenzaprine 10 MG tablet Commonly known as: FLEXERIL Take 1 tablet (10 mg total) by mouth 3 (three) times daily as needed for muscle spasms.   EPINEPHrine 0.3 mg/0.3 mL Soaj injection Commonly known as: EpiPen 2-Pak Inject 0.3 mg into the muscle once as needed for up to 1 dose (for severe allergic reaction). CAll 911 immediately if you have to use this medicine   Eucrisa 2 % Oint Generic drug: Crisaborole twice a day  as needed to red, itchy areas on your hands   polyethylene glycol 17 g packet Commonly known as: MiraLax Take 17 g by mouth daily as needed for mild constipation or moderate constipation.   prenatal multivitamin Tabs tablet Take 1 tablet by mouth daily at 12 noon.   senna-docusate 8.6-50 MG tablet Commonly known as: Senokot-S Take 2 tablets by mouth 2 (two) times daily as needed for mild constipation or moderate constipation.   triamcinolone ointment 0.1 % Commonly known as: KENALOG Apply to red itchy areas below face.        Tereso Newcomer, MD 06/27/2022 3:20 AM

## 2022-06-28 ENCOUNTER — Ambulatory Visit (HOSPITAL_COMMUNITY)
Admission: EM | Admit: 2022-06-28 | Discharge: 2022-06-28 | Disposition: A | Discharge status: Home / Self Care | Payer: Medicaid Other | Attending: Psychiatry | Admitting: Psychiatry

## 2022-06-28 DIAGNOSIS — Z8659 Personal history of other mental and behavioral disorders: Secondary | ICD-10-CM | POA: Insufficient documentation

## 2022-06-28 DIAGNOSIS — F4323 Adjustment disorder with mixed anxiety and depressed mood: Secondary | ICD-10-CM

## 2022-06-28 NOTE — ED Triage Notes (Signed)
Pt presents to [facility] voluntarily. Pt was recommended for a mental health evaluation due to scoring high on her depression and anxiety scale yesterday at her doctors appointment.Pt reports hallucinations (hearinf voices) in the past but nothing today. Pt seen by provider during triage. Pt denies SI/HI and AVH

## 2022-06-28 NOTE — BH Assessment (Signed)
LCSW Progress Note  Per Doran Heater, NP, this pt does not require psychiatric hospitalization at this time.  Pt is psychiatrically cleared.  Discharge instructions include several resources for outpatient therapy and medication management along with the mobile crisis number in both [place] and [place].  EDP Doran Heater, NP, has been notified.  Hansel Starling, MSW, LCSW [facility]-Behavioral Health Department [phone] or [phone]
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
## 2022-06-28 NOTE — Discharge Instructions (Addendum)
Para sus necesidades de salud del comportamiento, se le recomienda que haga un seguimiento con Engineer, manufacturing systems de [facility] [place] [facility] [place] lo antes posible:       [facility] [place]      [address]      [place], [place] [address]      [phone]       Ofrecen manejo y terapia de psiquiatra/medicamentos. Los nuevos pacientes son atendidos en su clnica sin cita previa. El horario de atencin sin cita previa es los lunes, [place], jueves y viernes de 8:00 a. m. a 11:00 a. m. para psiquiatra, y los lunes y mircoles de 8:00 a. m. a 11:00 a. m. para terapia. Los pacientes sin cita previa se atienden por orden de llegada, as que trate de llegar lo antes posible para tener la mejor oportunidad de ser atendido el [employer] ASEGRESE DE TOMAR EL ASCENSOR AL [address] PISO. Tenga en cuenta que para ser elegible para los servicios debe traer Neomia Dear identificacin o un correo con su nombre y [place] direccin en el condado de [place].  For your behavioral health needs you are advised to follow up with [facility] at your earliest opportunity:       [facility]      [address] SECOND FLOOR      [place], [place] [address]      [phone]       They offer psychiatry/medication management and therapy.  New patients are seen in their walk-in clinic.  Walk-in hours are Monday, Wednesday, Thursday and Friday from 8:00 am - 11:00 am for psychiatry, and Monday and Wednesday from 8:00 am - 11:00 am for therapy.  Walk-in patients are seen on a first come, first served basis, so try to arrive as early as possible for the best chance of being seen the same day.  BE SURE TO TAKE THE ELEVATOR TO THE SECOND FLOOR.  Please note that to be eligible for services you must bring an ID or a piece of mail with your name and a [facility] address.    Otros proveedores de Patent attorney y administracin de  medicamentos:  [facility] [address] [place], [address] [phone] telfono [phone] fax  [facility] [phone] [place], [place], [address] [phone] telfono  evans blount [address] [place], [place], [address] [phone] telfono  [facility] [employer] [facility] [address] [place], [address] [phone] telfono [phone] fax  [facility], [place] [address], [place], [address] [phone] [facility] [address] [place], [place], [address] [phone] telfono [phone] fax  [facility] [address] [place], [place], [address] [phone] telfono [phone] fax  [facility] [address] [place], [place], [address] [phone] telfono para programar una cita inicial   Other providers for outpatient therapy and medication management:  [facility] [address] [place], [place], [address] [phone] phone [phone] fax  [employer] [address], [place], [address] [phone] phone  Jovita Kussmaul [address] [place], [place], [address] [phone] phone  [facility] [address] [place], [place], [address] [phone] phone [phone] fax  [facility], [place]  [address] [place], [place], [address] [phone] phone  [facility] [address] [place], [place], [address] [phone] phone [phone] fax       [facility]      [address] [place], [place], [address]      [phone] phone      [phone] fax        [facility]      [address]       [place], [place], [address]      [phone] phone to set up initial appointment

## 2022-06-29 DIAGNOSIS — Z419 Encounter for procedure for purposes other than remedying health state, unspecified: Secondary | ICD-10-CM | POA: Diagnosis not present

## 2022-07-03 ENCOUNTER — Ambulatory Visit (HOSPITAL_BASED_OUTPATIENT_CLINIC_OR_DEPARTMENT_OTHER): Payer: Medicaid Other

## 2022-07-03 ENCOUNTER — Ambulatory Visit: Payer: Medicaid Other | Attending: Obstetrics & Gynecology | Admitting: *Deleted

## 2022-07-03 VITALS — BP 115/74 | HR 87

## 2022-07-03 DIAGNOSIS — O26843 Uterine size-date discrepancy, third trimester: Secondary | ICD-10-CM | POA: Insufficient documentation

## 2022-07-03 DIAGNOSIS — O1493 Unspecified pre-eclampsia, third trimester: Secondary | ICD-10-CM | POA: Diagnosis not present

## 2022-07-03 DIAGNOSIS — Z363 Encounter for antenatal screening for malformations: Secondary | ICD-10-CM | POA: Diagnosis not present

## 2022-07-03 DIAGNOSIS — O09523 Supervision of elderly multigravida, third trimester: Secondary | ICD-10-CM | POA: Insufficient documentation

## 2022-07-03 DIAGNOSIS — O09293 Supervision of pregnancy with other poor reproductive or obstetric history, third trimester: Secondary | ICD-10-CM | POA: Diagnosis not present

## 2022-07-03 DIAGNOSIS — Z3A36 36 weeks gestation of pregnancy: Secondary | ICD-10-CM | POA: Insufficient documentation

## 2022-07-03 DIAGNOSIS — O09513 Supervision of elderly primigravida, third trimester: Secondary | ICD-10-CM

## 2022-07-04 DIAGNOSIS — Z3483 Encounter for supervision of other normal pregnancy, third trimester: Secondary | ICD-10-CM | POA: Diagnosis not present

## 2022-07-04 DIAGNOSIS — Z113 Encounter for screening for infections with a predominantly sexual mode of transmission: Secondary | ICD-10-CM | POA: Diagnosis not present

## 2022-07-04 LAB — OB RESULTS CONSOLE GC/CHLAMYDIA
Chlamydia: NEGATIVE
Neisseria Gonorrhea: NEGATIVE

## 2022-07-04 LAB — OB RESULTS CONSOLE GBS: GBS: NEGATIVE

## 2022-07-12 DIAGNOSIS — Z3483 Encounter for supervision of other normal pregnancy, third trimester: Secondary | ICD-10-CM | POA: Diagnosis not present

## 2022-07-17 ENCOUNTER — Encounter (HOSPITAL_COMMUNITY): Payer: Self-pay | Admitting: Obstetrics & Gynecology

## 2022-07-17 ENCOUNTER — Inpatient Hospital Stay (HOSPITAL_COMMUNITY)
Admission: AD | Admit: 2022-07-17 | Discharge: 2022-07-19 | DRG: 807 | Disposition: A | Discharge status: Home / Self Care | Payer: Medicaid Other | Attending: Obstetrics & Gynecology | Admitting: Obstetrics & Gynecology

## 2022-07-17 ENCOUNTER — Other Ambulatory Visit: Payer: Self-pay

## 2022-07-17 ENCOUNTER — Inpatient Hospital Stay (HOSPITAL_COMMUNITY): Payer: Medicaid Other | Admitting: Anesthesiology

## 2022-07-17 ENCOUNTER — Inpatient Hospital Stay (EMERGENCY_DEPARTMENT_HOSPITAL)
Admission: AD | Admit: 2022-07-17 | Discharge: 2022-07-17 | Disposition: A | Discharge status: Home / Self Care | Payer: Medicaid Other | Source: Home / Self Care | Attending: Obstetrics & Gynecology | Admitting: Obstetrics & Gynecology

## 2022-07-17 ENCOUNTER — Encounter (HOSPITAL_COMMUNITY): Payer: Self-pay | Admitting: Family Medicine

## 2022-07-17 DIAGNOSIS — O26853 Spotting complicating pregnancy, third trimester: Secondary | ICD-10-CM | POA: Insufficient documentation

## 2022-07-17 DIAGNOSIS — R030 Elevated blood-pressure reading, without diagnosis of hypertension: Secondary | ICD-10-CM | POA: Diagnosis present

## 2022-07-17 DIAGNOSIS — O26893 Other specified pregnancy related conditions, third trimester: Principal | ICD-10-CM | POA: Diagnosis present

## 2022-07-17 DIAGNOSIS — O9832 Other infections with a predominantly sexual mode of transmission complicating childbirth: Secondary | ICD-10-CM | POA: Diagnosis present

## 2022-07-17 DIAGNOSIS — Z3480 Encounter for supervision of other normal pregnancy, unspecified trimester: Principal | ICD-10-CM

## 2022-07-17 DIAGNOSIS — A6000 Herpesviral infection of urogenital system, unspecified: Secondary | ICD-10-CM | POA: Diagnosis not present

## 2022-07-17 DIAGNOSIS — Z3A38 38 weeks gestation of pregnancy: Secondary | ICD-10-CM | POA: Diagnosis not present

## 2022-07-17 DIAGNOSIS — O4202 Full-term premature rupture of membranes, onset of labor within 24 hours of rupture: Secondary | ICD-10-CM | POA: Diagnosis not present

## 2022-07-17 LAB — COMPREHENSIVE METABOLIC PANEL
ALT: 16 U/L (ref 0–44)
AST: 17 U/L (ref 15–41)
Albumin: 2.6 g/dL — ABNORMAL LOW (ref 3.5–5.0)
Alkaline Phosphatase: 174 U/L — ABNORMAL HIGH (ref 38–126)
Anion gap: 10 (ref 5–15)
BUN: 17 mg/dL (ref 6–20)
CO2: 20 mmol/L — ABNORMAL LOW (ref 22–32)
Calcium: 8.8 mg/dL — ABNORMAL LOW (ref 8.9–10.3)
Chloride: 107 mmol/L (ref 98–111)
Creatinine, Ser: 0.88 mg/dL (ref 0.44–1.00)
GFR, Estimated: >60 mL/min (ref >60)
Glucose, Bld: 102 mg/dL — ABNORMAL HIGH (ref 70–99)
Potassium: 3.8 mmol/L (ref 3.5–5.1)
Sodium: 137 mmol/L (ref 135–145)
Total Bilirubin: 0.4 mg/dL (ref 0.3–1.2)
Total Protein: 5.6 g/dL — ABNORMAL LOW (ref 6.5–8.1)

## 2022-07-17 LAB — CBC
HCT: 36.5 % (ref 36.0–46.0)
HCT: 38.9 % (ref 36.0–46.0)
Hemoglobin: 12.4 g/dL (ref 12.0–15.0)
Hemoglobin: 12.9 g/dL (ref 12.0–15.0)
MCH: 29.7 pg (ref 26.0–34.0)
MCH: 28.9 pg (ref 26.0–34.0)
MCHC: 34 g/dL (ref 30.0–36.0)
MCHC: 33.2 g/dL (ref 30.0–36.0)
MCV: 87.5 fL (ref 80.0–100.0)
MCV: 87 fL (ref 80.0–100.0)
Platelets: 239 10*3/uL (ref 150–400)
Platelets: 249 10*3/uL (ref 150–400)
RBC: 4.17 MIL/uL (ref 3.87–5.11)
RBC: 4.47 MIL/uL (ref 3.87–5.11)
RDW: 14 % (ref 11.5–15.5)
RDW: 14 % (ref 11.5–15.5)
WBC: 9.8 10*3/uL (ref 4.0–10.5)
WBC: 10.1 10*3/uL (ref 4.0–10.5)
nRBC: 0 % (ref 0.0–0.2)
nRBC: 0 % (ref 0.0–0.2)

## 2022-07-17 LAB — PROTEIN / CREATININE RATIO, URINE
Creatinine, Urine: 70 mg/dL
Protein Creatinine Ratio: 0.17 mg/mg{Cre} — ABNORMAL HIGH (ref 0.00–0.15)
Total Protein, Urine: 12 mg/dL

## 2022-07-17 LAB — POCT FERN TEST: POCT Fern Test: POSITIVE

## 2022-07-17 LAB — TYPE AND SCREEN
ABO/RH(D): O POS
Antibody Screen: NEGATIVE

## 2022-07-17 MED ORDER — PRENATAL MULTIVITAMIN CH
1.0000 | ORAL_TABLET | Freq: Every day | ORAL | Status: DC
  Administered 2022-07-19: 1 via ORAL
  Filled 2022-07-17 (×2): qty 1

## 2022-07-17 MED ORDER — EPHEDRINE 5 MG/ML INJ: 10.0000 mg | INTRAVENOUS | Status: DC | PRN

## 2022-07-17 MED ORDER — ONDANSETRON HCL 4 MG PO TABS: 4.0000 mg | ORAL_TABLET | ORAL | Status: DC | PRN

## 2022-07-17 MED ORDER — ZOLPIDEM TARTRATE 5 MG PO TABS: 5.0000 mg | ORAL_TABLET | Freq: Every evening | ORAL | Status: DC | PRN

## 2022-07-17 MED ORDER — LACTATED RINGERS IV SOLN: INTRAVENOUS | Status: DC

## 2022-07-17 MED ORDER — ACETAMINOPHEN 325 MG PO TABS: 650.0000 mg | ORAL_TABLET | ORAL | Status: DC | PRN

## 2022-07-17 MED ORDER — SOD CITRATE-CITRIC ACID 500-334 MG/5ML PO SOLN: 30.0000 mL | ORAL | Status: DC | PRN

## 2022-07-17 MED ORDER — LACTATED RINGERS IV SOLN: 500.0000 mL | Freq: Once | INTRAVENOUS | Status: DC

## 2022-07-17 MED ORDER — PHENYLEPHRINE 80 MCG/ML (10ML) SYRINGE FOR IV PUSH (FOR BLOOD PRESSURE SUPPORT): 80.0000 ug | PREFILLED_SYRINGE | INTRAVENOUS | Status: DC | PRN

## 2022-07-17 MED ORDER — OXYTOCIN-SODIUM CHLORIDE 30-0.9 UT/500ML-% IV SOLN
1.0000 m[IU]/min | INTRAVENOUS | Status: DC
  Administered 2022-07-17: 2 m[IU]/min via INTRAVENOUS
  Filled 2022-07-17: qty 500

## 2022-07-17 MED ORDER — OXYTOCIN BOLUS FROM INFUSION
333.0000 mL | Freq: Once | INTRAVENOUS | Status: AC
  Administered 2022-07-17: 333 mL via INTRAVENOUS

## 2022-07-17 MED ORDER — FENTANYL-BUPIVACAINE-NACL 0.5-0.125-0.9 MG/250ML-% EP SOLN
EPIDURAL | Status: DC | PRN
  Administered 2022-07-17: 12 mL/h via EPIDURAL

## 2022-07-17 MED ORDER — TERBUTALINE SULFATE 1 MG/ML IJ SOLN: 0.2500 mg | Freq: Once | INTRAMUSCULAR | Status: DC | PRN

## 2022-07-17 MED ORDER — OXYCODONE-ACETAMINOPHEN 5-325 MG PO TABS: 1.0000 | ORAL_TABLET | ORAL | Status: DC | PRN

## 2022-07-17 MED ORDER — SIMETHICONE 80 MG PO CHEW: 80.0000 mg | CHEWABLE_TABLET | ORAL | Status: DC | PRN

## 2022-07-17 MED ORDER — COCONUT OIL OIL: 1.0000 | TOPICAL_OIL | Status: DC | PRN

## 2022-07-17 MED ORDER — PHENYLEPHRINE 80 MCG/ML (10ML) SYRINGE FOR IV PUSH (FOR BLOOD PRESSURE SUPPORT)
80.0000 ug | PREFILLED_SYRINGE | INTRAVENOUS | Status: DC | PRN
  Filled 2022-07-17: qty 10

## 2022-07-17 MED ORDER — WITCH HAZEL-GLYCERIN EX PADS: 1.0000 | MEDICATED_PAD | CUTANEOUS | Status: DC | PRN

## 2022-07-17 MED ORDER — TETANUS-DIPHTH-ACELL PERTUSSIS 5-2.5-18.5 LF-MCG/0.5 IM SUSY: 0.5000 mL | PREFILLED_SYRINGE | Freq: Once | INTRAMUSCULAR | Status: DC

## 2022-07-17 MED ORDER — ONDANSETRON HCL 4 MG/2ML IJ SOLN: 4.0000 mg | Freq: Four times a day (QID) | INTRAMUSCULAR | Status: DC | PRN

## 2022-07-17 MED ORDER — DIBUCAINE (PERIANAL) 1 % EX OINT: 1.0000 | TOPICAL_OINTMENT | CUTANEOUS | Status: DC | PRN

## 2022-07-17 MED ORDER — IBUPROFEN 600 MG PO TABS
600.0000 mg | ORAL_TABLET | Freq: Four times a day (QID) | ORAL | Status: DC
  Administered 2022-07-18 – 2022-07-19 (×2): 600 mg via ORAL
  Filled 2022-07-17 (×7): qty 1

## 2022-07-17 MED ORDER — LACTATED RINGERS IV SOLN: 500.0000 mL | INTRAVENOUS | Status: DC | PRN

## 2022-07-17 MED ORDER — OXYCODONE-ACETAMINOPHEN 5-325 MG PO TABS: 2.0000 | ORAL_TABLET | ORAL | Status: DC | PRN

## 2022-07-17 MED ORDER — BENZOCAINE-MENTHOL 20-0.5 % EX AERO: 1.0000 | INHALATION_SPRAY | CUTANEOUS | Status: DC | PRN

## 2022-07-17 MED ORDER — LIDOCAINE HCL (PF) 1 % IJ SOLN: 30.0000 mL | INTRAMUSCULAR | Status: DC | PRN

## 2022-07-17 MED ORDER — OXYTOCIN-SODIUM CHLORIDE 30-0.9 UT/500ML-% IV SOLN: 2.5000 [IU]/h | INTRAVENOUS | Status: DC

## 2022-07-17 MED ORDER — ONDANSETRON HCL 4 MG/2ML IJ SOLN: 4.0000 mg | INTRAMUSCULAR | Status: DC | PRN

## 2022-07-17 MED ORDER — FENTANYL-BUPIVACAINE-NACL 0.5-0.125-0.9 MG/250ML-% EP SOLN
12.0000 mL/h | EPIDURAL | Status: DC | PRN
  Filled 2022-07-17: qty 250

## 2022-07-17 MED ORDER — DIPHENHYDRAMINE HCL 50 MG/ML IJ SOLN: 12.5000 mg | INTRAMUSCULAR | Status: DC | PRN

## 2022-07-17 MED ORDER — LIDOCAINE HCL (PF) 1 % IJ SOLN
INTRAMUSCULAR | Status: DC | PRN
  Administered 2022-07-17: 5 mL via EPIDURAL

## 2022-07-17 MED ORDER — SENNOSIDES-DOCUSATE SODIUM 8.6-50 MG PO TABS
2.0000 | ORAL_TABLET | Freq: Every day | ORAL | Status: DC
  Administered 2022-07-19: 2 via ORAL
  Filled 2022-07-17 (×2): qty 2

## 2022-07-17 MED ORDER — DIPHENHYDRAMINE HCL 25 MG PO CAPS: 25.0000 mg | ORAL_CAPSULE | Freq: Four times a day (QID) | ORAL | Status: DC | PRN

## 2022-07-17 NOTE — MAU Note (Addendum)
.  Kristen Frank is a 36 y.o. at [redacted]w[redacted]d here in MAU reporting lost mucous plug tonight and then had some vag bleeding with mucous at 2330. Good FM. Denies any pain currently. States she fell May 30 and has had back pain and hurts all over at times since then. Was given pain med by doctor that helps so no pain now. Onset of complaint Tues night Pain score: 0 Vitals:   07/17/22 0225 07/17/22 0228  BP:  (!) 122/92  Pulse: 80   Resp: 17   Temp: 97.9 F (36.6 C)   SpO2: 100%      FHT:148 Lab orders placed from triage:

## 2022-07-17 NOTE — H&P (Addendum)
OBSTETRIC ADMISSION HISTORY AND PHYSICAL  Kristen Frank is a 36 y.o. female [phone] with IUP at [redacted]w[redacted]d by LMP presenting for SROM. Patient presented to MAU with CTX and report of possible LOF, found to have positive fern test. Also reports some bloody show. She reports +FMs, no blurry vision, headaches or peripheral edema, and RUQ pain. Feels that fetus is bigger than prior infants. She plans on breast and bottle feeding. She requests an interval BTL for birth control; papers were signed on 7/6. She received her prenatal care at [facility]   Dating: By LMP --->  Estimated Date of Delivery: 07/30/22  Sono:    @[redacted]w[redacted]d , CWD, normal anatomy, cephalic presentation, posterior lie, 3266g, 88% EFW   Prenatal History/Complications:  - Hx Preeclampsia with low birth weight delivery - Advanced maternal age - Hx HSV on valtrex - Low lying placenta 2nd trimester, resolved  Past Medical History: Past Medical History:  Diagnosis Date   GERD (gastroesophageal reflux disease)    History of depression    HSV-2 infection    Idiopathic urticaria 07/26/2021   Preeclampsia 04/20/2012    Past Surgical History: Past Surgical History:  Procedure Laterality Date   No past surgery      Obstetrical History: OB History     Gravida  4   Para  3   Term  3   Preterm  0   AB  0   Living  3      SAB  0   IAB  0   Ectopic  0   Multiple      Live Births  3           Social History Social History   Socioeconomic History   Marital status: Single    Spouse name: Not on file   Number of children: 3   Years of education: Not on file   Highest education level: Not on file  Occupational History   Not on file  Tobacco Use   Smoking status: Never   Smokeless tobacco: Never  Vaping Use   Vaping Use: Never used  Substance and Sexual Activity   Alcohol use: No   Drug use: No   Sexual activity: Not Currently    Birth control/protection: None  Other Topics Concern   Not on file  Social  History Narrative   ** Merged History Encounter **       Social Determinants of Health   Financial Resource Strain: Not on file  Food Insecurity: No Food Insecurity (04/05/2021)   Hunger Vital Sign    Worried About Running Out of Food in the Last Year: Never true    Ran Out of Food in the Last Year: Never true  Transportation Needs: Not on file  Physical Activity: Not on file  Stress: Not on file  Social Connections: Not on file    Family History: Family History  Problem Relation Age of Onset   Kidney disease Maternal Uncle    Cervical cancer Maternal Grandmother    Cancer Other    Hypertension Other    Diabetes Other    Heart disease Neg Hx     Allergies: Allergies  Allergen Reactions   Gold Sodium Thiosulfate    Nickel    Other     Seafood, Lidocaine, Gold, Red meat,lentils,eggs, pecans    Medications Prior to Admission  Medication Sig Dispense Refill Last Dose   acetaminophen (TYLENOL) 500 MG tablet Take 2 tablets (1,000 mg total) by mouth every 6 (  six) hours as needed for mild pain or moderate pain. 60 tablet 0    cyclobenzaprine (FLEXERIL) 10 MG tablet Take 1 tablet (10 mg total) by mouth 3 (three) times daily as needed for muscle spasms. 30 tablet 2    EPINEPHrine (EPIPEN 2-PAK) 0.3 mg/0.3 mL IJ SOAJ injection Inject 0.3 mg into the muscle once as needed for up to 1 dose (for severe allergic reaction). CAll 911 immediately if you have to use this medicine (Patient not taking: Reported on 07/03/2022) 1 each 1    omeprazole (PRILOSEC) 20 MG capsule Take 20 mg by mouth daily.      polyethylene glycol (MIRALAX) 17 g packet Take 17 g by mouth daily as needed for mild constipation or moderate constipation. 14 each 2    Prenatal Vit-Fe Fumarate-FA (PRENATAL MULTIVITAMIN) TABS tablet Take 1 tablet by mouth daily at 12 noon.      senna-docusate (SENOKOT-S) 8.6-50 MG tablet Take 2 tablets by mouth 2 (two) times daily as needed for mild constipation or moderate constipation. 30  tablet 2    valACYclovir (VALTREX) 500 MG tablet Take 500 mg by mouth daily.        Review of Systems   All systems reviewed and negative except as stated in HPI  Blood pressure 112/76, pulse 88, temperature 98.5 F (36.9 C), temperature source Oral, resp. rate 16, height 5\' 2"  (1.575 m), weight 79.8 kg, last menstrual period 10/23/2021, SpO2 97 %, unknown if currently breastfeeding. General appearance: alert, cooperative, appears stated age, and no distress Lungs: no increased work of breathing on room air Heart: regular rate  Abdomen: gravid, non-tender Pelvic: performed in MAU Extremities: no sign of DVT Presentation: Reported cephalic Fetal monitoringBaseline: 145 bpm, Variability: Good {> 6 bpm), Accelerations: Reactive, and Decelerations: Absent Uterine activity: CTX q3-4 min Dilation: 3.5 Effacement (%): 50 Station: -2 Exam by:: K.Wilson,RN   Prenatal labs: ABO, Rh: --/--/PENDING (07/19 1341) Antibody: PENDING (07/19 1341) Rubella: Immune (03/16 0000) RPR: Nonreactive (05/11 0000)  HBsAg: Negative (03/16 0000)  HIV: Non-reactive (05/11 0000)  GBS: Negative/-- (07/06 0000)  Early 1 hr Glucola 95, 24-30 wk DM screen with 1 hr Glucola 100 Genetic screening - low risk NIPS Anatomy 04-20-1994 normal female anatomy  Prenatal Transfer Tool  Maternal Diabetes: No Genetic Screening: Normal Maternal Ultrasounds/Referrals: Other: size-dates discrepancy 3rd trimester Fetal Ultrasounds or other Referrals:  Referred to Materal Fetal Medicine  Maternal Substance Abuse:  No Significant Maternal Medications:  Meds include: Other: Valtrex, Prilosec, flexeril Significant Maternal Lab Results: Group B Strep negative  Results for orders placed or performed during the hospital encounter of 07/17/22 (from the past 24 hour(s))  POCT fern test   Collection Time: 07/17/22  1:28 PM  Result Value Ref Range   POCT Fern Test Positive = ruptured amniotic membanes   CBC   Collection Time: 07/17/22   1:41 PM  Result Value Ref Range   WBC 10.1 4.0 - 10.5 K/uL   RBC 4.47 3.87 - 5.11 MIL/uL   Hemoglobin 12.9 12.0 - 15.0 g/dL   HCT 07/19/22 53.6 - 64.4 %   MCV 87.0 80.0 - 100.0 fL   MCH 28.9 26.0 - 34.0 pg   MCHC 33.2 30.0 - 36.0 g/dL   RDW 03.4 74.2 - 59.5 %   Platelets 249 150 - 400 K/uL   nRBC 0.0 0.0 - 0.2 %  Type and screen [facility]   Collection Time: 07/17/22  1:41 PM  Result Value Ref Range  ABO/RH(D) PENDING    Antibody Screen PENDING    Sample Expiration      07/20/2022,2359 Performed at [facility], [address], [place], [place] [address]   Results for orders placed or performed during the hospital encounter of 07/17/22 (from the past 24 hour(s))  Protein / creatinine ratio, urine   Collection Time: 07/17/22  2:45 AM  Result Value Ref Range   Creatinine, Urine 70 mg/dL   Total Protein, Urine 12 mg/dL   Protein Creatinine Ratio 0.17 (H) 0.00 - 0.15 mg/mg[Cre]  CBC   Collection Time: 07/17/22  3:52 AM  Result Value Ref Range   WBC 9.8 4.0 - 10.5 K/uL   RBC 4.17 3.87 - 5.11 MIL/uL   Hemoglobin 12.4 12.0 - 15.0 g/dL   HCT 60.1 09.3 - 23.5 %   MCV 87.5 80.0 - 100.0 fL   MCH 29.7 26.0 - 34.0 pg   MCHC 34.0 30.0 - 36.0 g/dL   RDW 57.3 22.0 - 25.4 %   Platelets 239 150 - 400 K/uL   nRBC 0.0 0.0 - 0.2 %  Comprehensive metabolic panel   Collection Time: 07/17/22  3:52 AM  Result Value Ref Range   Sodium 137 135 - 145 mmol/L   Potassium 3.8 3.5 - 5.1 mmol/L   Chloride 107 98 - 111 mmol/L   CO2 20 (L) 22 - 32 mmol/L   Glucose, Bld 102 (H) 70 - 99 mg/dL   BUN 17 6 - 20 mg/dL   Creatinine, Ser 2.70 0.44 - 1.00 mg/dL   Calcium 8.8 (L) 8.9 - 10.3 mg/dL   Total Protein 5.6 (L) 6.5 - 8.1 g/dL   Albumin 2.6 (L) 3.5 - 5.0 g/dL   AST 17 15 - 41 U/L   ALT 16 0 - 44 U/L   Alkaline Phosphatase 174 (H) 38 - 126 U/L   Total Bilirubin 0.4 0.3 - 1.2 mg/dL   GFR, Estimated >62 >37 mL/min   Anion gap 10 5 - 15    Patient Active Problem List    Diagnosis Date Noted   Supervision of other normal pregnancy, antepartum 07/17/2022   Allergic reaction to insect sting 07/26/2021   Food intolerance 07/26/2021   Idiopathic urticaria 07/26/2021   Chronic rhinitis 07/26/2021   Allergic contact dermatitis due to other agents 01/22/2021   MDD (major depressive disorder), single episode, severe , no psychosis (HCC) 01/18/2019    Assessment/Plan:  Kristen Frank is a 36 y.o. [id number] at [redacted]w[redacted]d here for SROM  #Labor: Patient agreeable to starting IV pitocin for labor augmentation, will plan to titrate pitocin to appropriate CTX pattern and recheck in approximately 4 hrs or sooner if needed #Pain: PRN, desires epidural #FWB: Cat 1 #ID:  GBS negative #MOF: Breast/bottle #MOC: BTL papers signed 7/6 #Circ:  Corliss Marcus, MD  07/17/2022, 2:30 PM  Attestation of Supervision of Student:  I confirm that I have verified the information documented in the  resident 's note and that I have also personally reperformed the history, physical exam and all medical decision making activities.  I have verified that all services and findings are accurately documented in this student's note; and I agree with management and plan as outlined in the documentation. I have also made any necessary editorial changes.   Raelyn Mora, CNM [facility] [employer], [facility] 07/17/2022 3:40 PM

## 2022-07-17 NOTE — Lactation Note (Signed)
This note was copied from a baby's chart. Lactation Consultation Note  Patient Name: Kristen Frank Date: 07/17/2022   Age:36 hours  Mom had infant latched for 30 mins. LC to provided further support on the floor.   Maternal Data    Feeding    LATCH Score                    Lactation Tools Discussed/Used    Interventions    Discharge    Consult Status      Kristen Cho  Frank 07/17/2022, 9:01 PM

## 2022-07-17 NOTE — MAU Provider Note (Signed)
History     [id number]  Arrival date and time: 07/17/22 0217    Chief Complaint  Patient presents with   Vaginal Bleeding     HPI Kristen Frank is a 36 y.o. at [redacted]w[redacted]d who presents for vaginal bleeding. States she woke up tonight & noticed she lost her mucous plug. Afterwards she saw some dark red mucoid bleeding when she wiped. Not bleeding into a pad. Denies abdominal pain, LOF, or recent intercourse. Good fetal movement.  Denies history of hypertension. Denies headache, visual disturbance, or epigastric pain.    OB History     Gravida  4   Para  3   Term  3   Preterm  0   AB  0   Living  3      SAB  0   IAB  0   Ectopic  0   Multiple      Live Births  3           Past Medical History:  Diagnosis Date   GERD (gastroesophageal reflux disease)    History of depression    HSV-2 infection    Idiopathic urticaria 07/26/2021   Preeclampsia 04/20/2012    Past Surgical History:  Procedure Laterality Date   No past surgery      Family History  Problem Relation Age of Onset   Kidney disease Maternal Uncle    Cervical cancer Maternal Grandmother    Cancer Other    Hypertension Other    Diabetes Other    Heart disease Neg Hx     Allergies  Allergen Reactions   Gold Sodium Thiosulfate    Nickel    Other     Seafood, Lidocaine, Gold, Red meat,lentils,eggs, pecans    No current facility-administered medications on file prior to encounter.   Current Outpatient Medications on File Prior to Encounter  Medication Sig Dispense Refill   acetaminophen (TYLENOL) 500 MG tablet Take 2 tablets (1,000 mg total) by mouth every 6 (six) hours as needed for mild pain or moderate pain. 60 tablet 0   cyclobenzaprine (FLEXERIL) 10 MG tablet Take 1 tablet (10 mg total) by mouth 3 (three) times daily as needed for muscle spasms. 30 tablet 2   EPINEPHrine (EPIPEN 2-PAK) 0.3 mg/0.3 mL IJ SOAJ injection Inject 0.3 mg into the muscle once as needed for up to 1 dose (for  severe allergic reaction). CAll 911 immediately if you have to use this medicine (Patient not taking: Reported on 07/03/2022) 1 each 1   omeprazole (PRILOSEC) 20 MG capsule Take 20 mg by mouth daily.     polyethylene glycol (MIRALAX) 17 g packet Take 17 g by mouth daily as needed for mild constipation or moderate constipation. 14 each 2   Prenatal Vit-Fe Fumarate-FA (PRENATAL MULTIVITAMIN) TABS tablet Take 1 tablet by mouth daily at 12 noon.     senna-docusate (SENOKOT-S) 8.6-50 MG tablet Take 2 tablets by mouth 2 (two) times daily as needed for mild constipation or moderate constipation. 30 tablet 2   valACYclovir (VALTREX) 500 MG tablet Take 500 mg by mouth daily.       ROS Pertinent positives and negative per HPI, all others reviewed and negative  Physical Exam   BP 112/73   Pulse 80   Temp 97.9 F (36.6 C)   Resp 17   Ht 5\' 2"  (1.575 m)   Wt 80.7 kg   LMP 10/23/2021   SpO2 100%   BMI 32.56 kg/m   Patient  Vitals for the past 24 hrs:  BP Temp Pulse Resp SpO2 Height Weight  07/17/22 0401 112/73 -- 80 -- -- -- --  07/17/22 0345 119/86 -- 85 -- -- -- --  07/17/22 0330 122/81 -- 81 -- -- -- --  07/17/22 0315 122/81 -- 81 -- -- -- --  07/17/22 0254 116/78 -- 90 -- -- -- --  07/17/22 0235 (!) 123/91 -- -- -- -- -- --  07/17/22 0228 (!) 122/92 -- -- -- -- -- --  07/17/22 0225 -- 97.9 F (36.6 C) 80 17 100 % 5\' 2"  (1.575 m) 80.7 kg    Physical Exam Vitals and nursing note reviewed. Exam conducted with a chaperone present.  Constitutional:      General: She is not in acute distress.    Appearance: Normal appearance.  HENT:     Head: Normocephalic and atraumatic.  Eyes:     Conjunctiva/sclera: Conjunctivae normal.     Pupils: Pupils are equal, round, and reactive to light.  Pulmonary:     Effort: Pulmonary effort is normal. No respiratory distress.  Abdominal:     Palpations: Abdomen is soft.     Tenderness: There is no abdominal tenderness.  Skin:    General: Skin is  warm and dry.  Neurological:     Mental Status: She is alert.  Psychiatric:        Mood and Affect: Mood normal.        Behavior: Behavior normal.      Cervical Exam Dilation: 1 Effacement (%): Thick Cervical Position: Middle Station: -3 Exam by:: [email or web address] NP   FHT Baseline 145, moderate variability, 15x15 accels, no decels Toco: irregular Cat: 1  Labs Results for orders placed or performed during the hospital encounter of 07/17/22 (from the past 24 hour(s))  Protein / creatinine ratio, urine     Status: Abnormal   Collection Time: 07/17/22  2:45 AM  Result Value Ref Range   Creatinine, Urine 70 mg/dL   Total Protein, Urine 12 mg/dL   Protein Creatinine Ratio 0.17 (H) 0.00 - 0.15 mg/mg[Cre]  CBC     Status: None   Collection Time: 07/17/22  3:52 AM  Result Value Ref Range   WBC 9.8 4.0 - 10.5 K/uL   RBC 4.17 3.87 - 5.11 MIL/uL   Hemoglobin 12.4 12.0 - 15.0 g/dL   HCT 07/19/22 86.7 - 61.9 %   MCV 87.5 80.0 - 100.0 fL   MCH 29.7 26.0 - 34.0 pg   MCHC 34.0 30.0 - 36.0 g/dL   RDW 50.9 32.6 - 71.2 %   Platelets 239 150 - 400 K/uL   nRBC 0.0 0.0 - 0.2 %  Comprehensive metabolic panel     Status: Abnormal   Collection Time: 07/17/22  3:52 AM  Result Value Ref Range   Sodium 137 135 - 145 mmol/L   Potassium 3.8 3.5 - 5.1 mmol/L   Chloride 107 98 - 111 mmol/L   CO2 20 (L) 22 - 32 mmol/L   Glucose, Bld 102 (H) 70 - 99 mg/dL   BUN 17 6 - 20 mg/dL   Creatinine, Ser 07/19/22 0.44 - 1.00 mg/dL   Calcium 8.8 (L) 8.9 - 10.3 mg/dL   Total Protein 5.6 (L) 6.5 - 8.1 g/dL   Albumin 2.6 (L) 3.5 - 5.0 g/dL   AST 17 15 - 41 U/L   ALT 16 0 - 44 U/L   Alkaline Phosphatase 174 (H) 38 - 126 U/L   Total Bilirubin  0.4 0.3 - 1.2 mg/dL   GFR, Estimated >11 >91 mL/min   Anion gap 10 5 - 15    Imaging No results found.  MAU Course  Procedures Lab Orders         CBC         Comprehensive metabolic panel         Protein / creatinine ratio, urine    No orders of the defined types  were placed in this encounter.  Imaging Orders  No imaging studies ordered today    MDM Intake BPs elevated. All repeats normal. Asymptomatic & normal preeclampsia labs.  Reports bloody mucous earlier. Cervix 1/thick. No blood. RH positive Has appointment with OB tomorrow Assessment and Plan   1. Elevated BP without diagnosis of hypertension   2. [redacted] weeks gestation of pregnancy    -Reviewed reasons to return to MAU -Keep appointment tomorrow  Judeth Horn, NP 07/17/22 5:05 AM

## 2022-07-17 NOTE — Anesthesia Procedure Notes (Signed)
Epidural Patient location during procedure: OB Start time: 07/17/2022 5:10 PM End time: 07/17/2022 5:28 PM  Staffing Anesthesiologist: Trevor Iha, MD Performed: anesthesiologist   Preanesthetic Checklist Completed: patient identified, IV checked, site marked, risks and benefits discussed, surgical consent, monitors and equipment checked, pre-op evaluation and timeout performed  Epidural Patient position: sitting Prep: DuraPrep and site prepped and draped Patient monitoring: continuous pulse ox and blood pressure Approach: midline Location: L3-L4 Injection technique: LOR air  Needle:  Needle type: Tuohy  Needle gauge: 17 G Needle length: 9 cm and 9 Needle insertion depth: 5 cm Catheter type: closed end flexible Catheter size: 19 Gauge Catheter at skin depth: 11 cm Test dose: negative  Assessment Events: blood not aspirated, injection not painful, no injection resistance, no paresthesia and negative IV test  Additional Notes Patient identified. Risks/Benefits/Options discussed with patient including but not limited to bleeding, infection, nerve damage, paralysis, failed block, incomplete pain control, headache, blood pressure changes, nausea, vomiting, reactions to medication both or allergic, itching and postpartum back pain. Confirmed with bedside nurse the patient's most recent platelet count. Confirmed with patient that they are not currently taking any anticoagulation, have any bleeding history or any family history of bleeding disorders. Patient expressed understanding and wished to proceed. All questions were answered. Sterile technique was used throughout the entire procedure. Please see nursing notes for vital signs. Test dose was given through epidural needle and negative prior to continuing to dose epidural or start infusion. Warning signs of high block given to the patient including shortness of breath, tingling/numbness in hands, complete motor block, or any  concerning symptoms with instructions to call for help. Patient was given instructions on fall risk and not to get out of bed. All questions and concerns addressed with instructions to call with any issues.  1 Attempt (S) . Patient tolerated procedure well.

## 2022-07-17 NOTE — MAU Note (Signed)
Kristen Frank is a 36 y.o. at [redacted]w[redacted]d here in MAU reporting: contractions are every 3 minutes. Also thinks maybe her water is broken. Has been leaking mucus with fluid and blood. +FM  Onset of complaint: today  Pain score: 6/10  Vitals:   07/17/22 1242  BP: 112/76  Pulse: 88  Resp: 16  Temp: 98.5 F (36.9 C)  SpO2: 97%     FHT:146  Lab orders placed from triage:none

## 2022-07-17 NOTE — Discharge Summary (Signed)
Postpartum Discharge Summary       Patient Name: Kristen Frank DOB: 1986-06-28 MRN: [id number]  Date of admission: 07/17/2022 Delivery date:07/17/2022  Delivering provider: Fabiola Backer  Date of discharge: 07/19/2022  Admitting diagnosis: Supervision of other normal pregnancy, antepartum [Z34.80] Intrauterine pregnancy: [redacted]w[redacted]d    Secondary diagnosis:  Principal Problem:   Supervision of other normal pregnancy, antepartum  Additional problems: None    Discharge diagnosis: Term Pregnancy Delivered                                              Post partum procedures: None Augmentation: Pitocin Complications: None  Hospital course: Onset of Labor With Vaginal Delivery      36y.o. yo [id number] 323w1das admitted in Latent Labor with SROM at [address] 07/17/2022. Patient had an uncomplicated labor course as follows:  Membrane Rupture Time/Date: 6:30 AM ,07/17/2022   Delivery Method:Vaginal, Spontaneous  Episiotomy: None  Lacerations:  None  Please see delivery note from 7/19 for additional details.   Patient had an uncomplicated postpartum course.  She is ambulating, tolerating a regular diet, passing flatus, and urinating well. Patient is discharged home in stable condition on 07/19/22.  Newborn Data: Birth date:07/17/2022  Birth time:7:50 PM  Gender:Female  Living status:Living  Apgars:8 ,9  Weight:3010 g   Magnesium Sulfate received: No BMZ received: No Rhophylac:N/A MMR:N/A T-DaP:Given prenatally Flu: Yes Transfusion:No  Physical exam  Vitals:   07/18/22 1454 07/18/22 1600 07/18/22 2129 07/19/22 0543  BP: 117/86 111/77 118/88   Pulse: 85  90 75  Resp: 16  18 16   Temp: 98.1 F (36.7 C)  98.5 F (36.9 C) 97.6 F (36.4 C)  TempSrc: Oral  Oral Oral  SpO2: 98%  100% 99%  Weight:      Height:       General: alert, cooperative, and no distress Lochia: appropriate Uterine Fundus: firm Incision: N/A DVT Evaluation: No evidence of DVT seen on physical  exam. No significant calf/ankle edema. Labs: Lab Results  Component Value Date   WBC 12.2 (H) 07/18/2022   HGB 12.8 07/18/2022   HCT 37.3 07/18/2022   MCV 86.1 07/18/2022   PLT 223 07/18/2022      Latest Ref Rng & Units 07/17/2022    3:52 AM  CMP  Glucose 70 - 99 mg/dL 102   BUN 6 - 20 mg/dL 17   Creatinine 0.44 - 1.00 mg/dL 0.88   Sodium 135 - 145 mmol/L 137   Potassium 3.5 - 5.1 mmol/L 3.8   Chloride 98 - 111 mmol/L 107   CO2 22 - 32 mmol/L 20   Calcium 8.9 - 10.3 mg/dL 8.8   Total Protein 6.5 - 8.1 g/dL 5.6   Total Bilirubin 0.3 - 1.2 mg/dL 0.4   Alkaline Phos 38 - 126 U/L 174   AST 15 - 41 U/L 17   ALT 0 - 44 U/L 16    Edinburgh Score:    07/18/2022    3:50 PM  Edinburgh Postnatal Depression Scale Screening Tool  I have been able to laugh and see the funny side of things. 1  I have looked forward with enjoyment to things. 1  I have blamed myself unnecessarily when things went wrong. 3  I have been anxious or worried for no good reason. 3  I have felt  scared or panicky for no good reason. 3  Things have been getting on top of me. 3  I have been so unhappy that I have had difficulty sleeping. 2  I have felt sad or miserable. 2  I have been so unhappy that I have been crying. 1  The thought of harming myself has occurred to me. 0  Edinburgh Postnatal Depression Scale Total 19     After visit meds:  Allergies as of 07/19/2022       Reactions   Gold Sodium Thiosulfate    Nickel    Other    Seafood, Lidocaine, Gold, Red meat,lentils,eggs, pecans        Medication List     TAKE these medications    acetaminophen 325 MG tablet Commonly known as: Tylenol Take 2 tablets (650 mg total) by mouth every 4 (four) hours as needed (for pain scale < 4). What changed:  medication strength how much to take when to take this reasons to take this   citalopram 10 MG tablet Commonly known as: CELEXA Take 1 tablet (10 mg total) by mouth daily. Start taking on:  July 20, 2022   cyclobenzaprine 10 MG tablet Commonly known as: FLEXERIL Take 1 tablet (10 mg total) by mouth 3 (three) times daily as needed for muscle spasms.   EPINEPHrine 0.3 mg/0.3 mL Soaj injection Commonly known as: EpiPen 2-Pak Inject 0.3 mg into the muscle once as needed for up to 1 dose (for severe allergic reaction). CAll 911 immediately if you have to use this medicine   hydrOXYzine 25 MG tablet Commonly known as: ATARAX Take 1 tablet (25 mg total) by mouth 3 (three) times daily as needed for anxiety.   ibuprofen 600 MG tablet Commonly known as: ADVIL Take 1 tablet (600 mg total) by mouth every 6 (six) hours.   omeprazole 20 MG capsule Commonly known as: PRILOSEC Take 20 mg by mouth daily.   polyethylene glycol 17 g packet Commonly known as: MiraLax Take 17 g by mouth daily as needed for mild constipation or moderate constipation.   prenatal multivitamin Tabs tablet Take 1 tablet by mouth daily at 12 noon.   senna-docusate 8.6-50 MG tablet Commonly known as: Senokot-S Take 2 tablets by mouth daily. What changed:  when to take this reasons to take this   valACYclovir 500 MG tablet Commonly known as: VALTREX Take 500 mg by mouth daily.         Discharge home in stable condition Infant Feeding: Bottle and Breast Infant Disposition:home with mother Discharge instruction: per After Visit Summary and Postpartum booklet. Activity: Advance as tolerated. Pelvic rest for 6 weeks.  Diet: routine diet Future Appointments: Future Appointments  Date Time Provider [place]  08/27/2022 11:00 AM Salley Slaughter, NP [facility] None   Follow up Visit:   Please schedule this patient for a In person postpartum visit in 6 weeks with the following provider: Any provider. 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

## 2022-07-17 NOTE — Anesthesia Preprocedure Evaluation (Signed)
Anesthesia Evaluation  Patient identified by MRN, date of birth, ID band Patient awake    Reviewed: Allergy & Precautions, NPO status , Patient's Chart, lab work & pertinent test results  Airway Mallampati: II  TM Distance: >3 FB Neck ROM: Full    Dental no notable dental hx. (+) Teeth Intact, Dental Advisory Given   Pulmonary neg pulmonary ROS,    Pulmonary exam normal breath sounds clear to auscultation       Cardiovascular Normal cardiovascular exam Rhythm:Regular Rate:Normal     Neuro/Psych    GI/Hepatic Neg liver ROS, GERD  ,  Endo/Other  negative endocrine ROS  Renal/GU negative Renal ROS     Musculoskeletal   Abdominal   Peds  Hematology Lab Results      Component                Value               Date                      WBC                      10.1                07/17/2022                HGB                      12.9                07/17/2022                HCT                      38.9                07/17/2022                MCV                      87.0                07/17/2022                PLT                      249                 07/17/2022              Anesthesia Other Findings   Reproductive/Obstetrics (+) Pregnancy                             Anesthesia Physical Anesthesia Plan  ASA: 2  Anesthesia Plan: Epidural   Post-op Pain Management:    Induction:   PONV Risk Score and Plan:   Airway Management Planned:   Additional Equipment:   Intra-op Plan:   Post-operative Plan:   Informed Consent: I have reviewed the patients History and Physical, chart, labs and discussed the procedure including the risks, benefits and alternatives for the proposed anesthesia with the patient or authorized representative who has indicated his/her understanding and acceptance.     Interpreter used for [employer] Discussed with:   Anesthesia Plan Comments:  (38.1 wk  Spanish speaking (interpreter)  G4P3 for LEA)        Anesthesia Quick Evaluation

## 2022-07-18 LAB — CBC
HCT: 37.3 % (ref 36.0–46.0)
Hemoglobin: 12.8 g/dL (ref 12.0–15.0)
MCH: 29.6 pg (ref 26.0–34.0)
MCHC: 34.3 g/dL (ref 30.0–36.0)
MCV: 86.1 fL (ref 80.0–100.0)
Platelets: 223 10*3/uL (ref 150–400)
RBC: 4.33 MIL/uL (ref 3.87–5.11)
RDW: 14.1 % (ref 11.5–15.5)
WBC: 12.2 10*3/uL — ABNORMAL HIGH (ref 4.0–10.5)
nRBC: 0 % (ref 0.0–0.2)

## 2022-07-18 LAB — RPR: RPR Ser Ql: NONREACTIVE

## 2022-07-18 MED ORDER — HYDROXYZINE HCL 25 MG PO TABS
25.0000 mg | ORAL_TABLET | Freq: Three times a day (TID) | ORAL | Status: DC | PRN
Start: 2022-07-18 — End: 2022-07-19

## 2022-07-18 MED ORDER — CITALOPRAM HYDROBROMIDE 20 MG PO TABS
10.0000 mg | ORAL_TABLET | Freq: Every day | ORAL | Status: DC
  Administered 2022-07-19: 10 mg via ORAL
  Filled 2022-07-18 (×2): qty 1

## 2022-07-18 MED ORDER — CITALOPRAM HYDROBROMIDE 20 MG PO TABS
20.0000 mg | ORAL_TABLET | Freq: Every day | ORAL | Status: DC
  Filled 2022-07-18: qty 1

## 2022-07-18 NOTE — Progress Notes (Addendum)
S: Dr. Barbaraann Faster was called by nursing staff due to high edinburgh score. I went to assess patient and she is willing to restart prior medication which was celexa. She states she feels more nervous than sad. She was made aware that it may take 4-6 weeks to notice a change in mood. Her husband and children were at bedside. A spanish interpreter was used during this entire encounter.   O:    07/18/2022    3:50 PM  Edinburgh Postnatal Depression Scale Screening Tool  I have been able to laugh and see the funny side of things. 1  I have looked forward with enjoyment to things. 1  I have blamed myself unnecessarily when things went wrong. 3  I have been anxious or worried for no good reason. 3  I have felt scared or panicky for no good reason. 3  Things have been getting on top of me. 3  I have been so unhappy that I have had difficulty sleeping. 2  I have felt sad or miserable. 2  I have been so unhappy that I have been crying. 1  The thought of harming myself has occurred to me. 0  Edinburgh Postnatal Depression Scale Total 19    A/P:  -Restart celexa daily -Atarax TID PRN  Lavonda Jumbo, DO OB Fellow, Faculty Practice [facility], [facility] 07/18/2022 3:07 PM

## 2022-07-18 NOTE — Anesthesia Postprocedure Evaluation (Signed)
Anesthesia Post Note  Patient: Essence Merle  Procedure(s) Performed: AN AD HOC LABOR EPIDURAL     Patient location during evaluation: Mother Baby Anesthesia Type: Epidural Level of consciousness: awake and alert and oriented Pain management: satisfactory to patient Vital Signs Assessment: post-procedure vital signs reviewed and stable Respiratory status: respiratory function stable Cardiovascular status: stable Postop Assessment: no headache, no backache, epidural receding, patient able to bend at knees, no signs of nausea or vomiting, adequate PO intake and able to ambulate Anesthetic complications: no   No notable events documented.  Last Vitals:  Vitals:   07/18/22 0920 07/18/22 1454  BP: 111/71 117/86  Pulse: 81 85  Resp: 16 16  Temp: 36.8 C 36.7 C  SpO2: 98% 98%    Last Pain:  Vitals:   07/18/22 1454  TempSrc: Oral  PainSc:    Pain Goal:                   Criss Bartles

## 2022-07-18 NOTE — Progress Notes (Signed)
Patient ID: Kristen Frank, female   DOB: [birth year], 36 y.o.   MRN: [id number] Post Partum Day 1 Subjective: no complaints, up ad lib, voiding, and tolerating PO. Has not passed flatus.  Objective: Blood pressure 98/70, pulse 66, temperature 98.2 F (36.8 C), temperature source Oral, resp. rate 18, height 5\' 2"  (1.575 m), weight 79.8 kg, last menstrual period 10/23/2021, SpO2 99 %, unknown if currently breastfeeding.  Physical Exam:  General: alert and cooperative Lochia: appropriate Uterine Fundus: firm DVT Evaluation: No evidence of DVT seen on physical exam.  Recent Labs    07/17/22 1341 07/18/22 0539  HGB 12.9 12.8  HCT 38.9 37.3    Assessment/Plan: Requests lactation consult. Plan for discharge tomorrow.   LOS: 1 day   07/20/22, Medical Student 07/18/2022, 7:58 AM

## 2022-07-18 NOTE — Lactation Note (Signed)
This note was copied from a baby's chart. Lactation Consultation Note  Patient Name: Kristen Frank [id number] Date: 07/18/2022 Reason for consult: Initial assessment;Early term 37-38.6wks Age:36 hours   P4 mother whose infant is now 42 hours old.  This is an early term infant at 38+1 weeks.  Mother's current feeding preference is breast/formula.  Spanish interpreter, Byrd Hesselbach [phone]) used for interpretation.  Mother has been breast feeding without difficulty. She denies discomfort with latching and feels like "Cyd" has been latching well.  She was concerned that he has not yet voided.  Reassurance given and mother no longer concerned.  "Cyd" has had a stool.    Taught hand expression and encouraged mother to continue practicing and to feed back any expressed colostrum that she obtains.  Demonstrated finger feeding.  Mother will feed on cue or at least 8-12 times/24 hours.  Suggested she call her RN/LC for latch assistance as needed.  Mother plans to contact [place] [facility] department to determine eligibility for an electric pump.  Informed her that we can provide a manual pump at discharge.  RN updated.   Maternal Data Has patient been taught Hand Expression?: Yes  Feeding Mother's Current Feeding Choice: Breast Milk and Formula  LATCH Score                    Lactation Tools Discussed/Used    Interventions Interventions: Breast feeding basics reviewed;Education;LC Services brochure (Spanish brochure given)  Discharge WIC Program: Yes (Mother will contact [facility] for an electric pump)  Consult Status Consult Status: Follow-up Date: 07/19/22 Follow-up type: In-patient    Dora Sims 07/18/2022, 8:34 AM

## 2022-07-18 NOTE — Social Work (Signed)
CSW received consult for hx of Depression. CSW met with MOB to offer support and complete assessment.     CSW entered room observed MOB in bed holding the infant. CSW introduced role and reason for the visit. MOB was agreeable to visit. CSW utilized interpreter services (Andrew #[id number]) for the visit. CSW inquired about how the MOB was feeling since giving birth. MOB responded that she has been feeling good. CSW inquired about MOBs  current mood. MOB stated that she has not been feeling depressed now or throughout this pregnancy. MOB reported she is prescribed medication for her depression but she could not remember the name of it but reported she has a follow up appointment on August 22nd. MOB reports the medicine has helped her symptoms. MOB denied having any SI, HI or DV at this time. MOB identified her supports to be FOB and his mom.    CSW provided education regarding the baby blues period vs. perinatal mood disorders, discussed treatment and gave resources for mental health follow up if concerns arise.  CSW recommends self-evaluation during the postpartum time period using the New Mom Checklist from Postpartum Progress and encouraged MOB to contact a medical professional if symptoms are noted at any time.     MOB identified [facility] for infants follow up care. CSW provided review of Sudden Infant Death Syndrome (SIDS) precautions. MOB notified  CSW that she had all necessary items for the baby.      CSW identifies no further need for intervention and no barriers to discharge at this time.   Sekai Nayak Clinical Social Worker [phone] 

## 2022-07-19 ENCOUNTER — Encounter (HOSPITAL_COMMUNITY): Payer: Self-pay | Admitting: Family Medicine

## 2022-07-19 MED ORDER — IBUPROFEN 600 MG PO TABS: 600.0000 mg | ORAL_TABLET | Freq: Four times a day (QID) | ORAL | 0 refills | Status: DC

## 2022-07-19 MED ORDER — HYDROXYZINE HCL 25 MG PO TABS: 25.0000 mg | ORAL_TABLET | Freq: Three times a day (TID) | ORAL | 0 refills | Status: DC | PRN

## 2022-07-19 MED ORDER — SENNOSIDES-DOCUSATE SODIUM 8.6-50 MG PO TABS: 2.0000 | ORAL_TABLET | Freq: Every day | ORAL | 2 refills | Status: DC

## 2022-07-19 MED ORDER — ACETAMINOPHEN 325 MG PO TABS: 650.0000 mg | ORAL_TABLET | ORAL | 0 refills | Status: AC | PRN

## 2022-07-19 MED ORDER — CITALOPRAM HYDROBROMIDE 10 MG PO TABS: 10.0000 mg | ORAL_TABLET | Freq: Every day | ORAL | 0 refills | Status: DC

## 2022-07-19 NOTE — Social Work (Signed)
MOB was referred for history of depression/anxiety. * Referral screened out by Clinical Social Worker because none of the following criteria appear to apply:  ~ History of anxiety/depression during this pregnancy, or of post-partum depression following prior delivery.  ~ Diagnosis of anxiety and/or depression within last 3 years OR * MOB's symptoms currently being treated with medication and/or therapy.  CSW spoke with nurse no new concerns, Per chart review MOB is taking Celexa 10mg .  Please contact the Clinical Social Worker if needs arise or by MOB request.  Clinical Social Worker [phone]

## 2022-07-22 ENCOUNTER — Telehealth (HOSPITAL_COMMUNITY): Payer: Self-pay | Admitting: *Deleted

## 2022-07-22 DIAGNOSIS — Z1331 Encounter for screening for depression: Secondary | ICD-10-CM

## 2022-07-22 NOTE — Telephone Encounter (Signed)
Patient had EPDS score of 19 on 7/20, while in the hospital. Referral for appointment with [facility] sent to Dr. Catalina Antigua for review. Deforest Hoyles, RN, 07/22/22, [phone].

## 2022-07-30 DIAGNOSIS — Z419 Encounter for procedure for purposes other than remedying health state, unspecified: Secondary | ICD-10-CM | POA: Diagnosis not present

## 2022-08-11 ENCOUNTER — Other Ambulatory Visit: Payer: Self-pay | Admitting: Student

## 2022-08-27 ENCOUNTER — Encounter (HOSPITAL_COMMUNITY): Payer: Self-pay | Admitting: Psychiatry

## 2022-08-27 ENCOUNTER — Ambulatory Visit (INDEPENDENT_AMBULATORY_CARE_PROVIDER_SITE_OTHER): Payer: Medicaid Other | Admitting: Psychiatry

## 2022-08-27 VITALS — BP 127/86 | HR 65 | Ht 62.0 in | Wt 163.0 lb

## 2022-08-27 DIAGNOSIS — Z789 Other specified health status: Secondary | ICD-10-CM | POA: Diagnosis not present

## 2022-08-27 DIAGNOSIS — Z8659 Personal history of other mental and behavioral disorders: Secondary | ICD-10-CM | POA: Diagnosis not present

## 2022-08-27 DIAGNOSIS — A6000 Herpesviral infection of urogenital system, unspecified: Secondary | ICD-10-CM | POA: Diagnosis not present

## 2022-08-27 DIAGNOSIS — Z3009 Encounter for other general counseling and advice on contraception: Secondary | ICD-10-CM | POA: Diagnosis not present

## 2022-08-27 DIAGNOSIS — K219 Gastro-esophageal reflux disease without esophagitis: Secondary | ICD-10-CM | POA: Diagnosis not present

## 2022-08-27 DIAGNOSIS — F411 Generalized anxiety disorder: Secondary | ICD-10-CM | POA: Diagnosis not present

## 2022-08-27 DIAGNOSIS — F53 Postpartum depression: Secondary | ICD-10-CM | POA: Diagnosis not present

## 2022-08-27 MED ORDER — PRAZOSIN HCL 1 MG PO CAPS: 1.0000 mg | ORAL_CAPSULE | Freq: Every day | ORAL | 3 refills | Status: DC

## 2022-08-27 MED ORDER — CITALOPRAM HYDROBROMIDE 20 MG PO TABS: 20.0000 mg | ORAL_TABLET | Freq: Every day | ORAL | 3 refills | Status: DC

## 2022-08-27 MED ORDER — HYDROXYZINE HCL 25 MG PO TABS: 25.0000 mg | ORAL_TABLET | Freq: Three times a day (TID) | ORAL | 3 refills | Status: DC
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
## 2022-08-30 DIAGNOSIS — Z419 Encounter for procedure for purposes other than remedying health state, unspecified: Secondary | ICD-10-CM | POA: Diagnosis not present

## 2022-09-17 ENCOUNTER — Encounter (HOSPITAL_COMMUNITY): Payer: Self-pay

## 2022-09-17 ENCOUNTER — Ambulatory Visit (INDEPENDENT_AMBULATORY_CARE_PROVIDER_SITE_OTHER): Payer: Medicaid Other | Admitting: Mental Health

## 2022-09-17 DIAGNOSIS — F322 Major depressive disorder, single episode, severe without psychotic features: Secondary | ICD-10-CM
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
## 2022-09-17 NOTE — Plan of Care (Signed)
  Problem: Depression CCP Problem  1  Goal: LTG: Reduce frequency, intensity, and duration of depression symptoms as evidenced by PHQ of 5 or less  Outcome: Initial Goal: STG: Mariachristina will decrease sxs of depression AEB development of x 3 effective coping skills within the next 6 months  Outcome: Initial Goal: STG: Desarai will increase management of depression AEB ability to reframe maladaptive thinking patterns and engagement in self-care daily within the next 6 months  Outcome: Initial

## 2022-09-20 ENCOUNTER — Ambulatory Visit: Payer: Self-pay | Admitting: *Deleted

## 2022-09-20 NOTE — Telephone Encounter (Signed)
FYI
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
## 2022-09-20 NOTE — Telephone Encounter (Signed)
Noted  

## 2022-09-22 DIAGNOSIS — M79601 Pain in right arm: Secondary | ICD-10-CM | POA: Diagnosis not present

## 2022-09-22 DIAGNOSIS — M79621 Pain in right upper arm: Secondary | ICD-10-CM | POA: Insufficient documentation

## 2022-09-22 DIAGNOSIS — R531 Weakness: Secondary | ICD-10-CM | POA: Diagnosis not present

## 2022-09-23 ENCOUNTER — Encounter (HOSPITAL_COMMUNITY): Payer: Self-pay | Admitting: Emergency Medicine

## 2022-09-23 ENCOUNTER — Other Ambulatory Visit: Payer: Self-pay

## 2022-09-23 ENCOUNTER — Emergency Department (HOSPITAL_COMMUNITY)
Admission: EM | Admit: 2022-09-23 | Discharge: 2022-09-23 | Disposition: A | Discharge status: Home / Self Care | Payer: Medicaid Other | Attending: Emergency Medicine | Admitting: Emergency Medicine

## 2022-09-23 ENCOUNTER — Emergency Department (HOSPITAL_COMMUNITY): Payer: Medicaid Other

## 2022-09-23 DIAGNOSIS — M79621 Pain in right upper arm: Secondary | ICD-10-CM | POA: Diagnosis not present

## 2022-09-23 DIAGNOSIS — M79601 Pain in right arm: Secondary | ICD-10-CM

## 2022-09-23 DIAGNOSIS — R531 Weakness: Secondary | ICD-10-CM | POA: Diagnosis not present

## 2022-09-23 LAB — CBC WITH DIFFERENTIAL/PLATELET
Abs Immature Granulocytes: 0.07 10*3/uL (ref 0.00–0.07)
Basophils Absolute: 0 10*3/uL (ref 0.0–0.1)
Basophils Relative: 0 %
Eosinophils Absolute: 0.3 10*3/uL (ref 0.0–0.5)
Eosinophils Relative: 3 %
HCT: 39.6 % (ref 36.0–46.0)
Hemoglobin: 12.9 g/dL (ref 12.0–15.0)
Immature Granulocytes: 1 %
Lymphocytes Relative: 36 %
Lymphs Abs: 3.9 10*3/uL (ref 0.7–4.0)
MCH: 30.1 pg (ref 26.0–34.0)
MCHC: 32.6 g/dL (ref 30.0–36.0)
MCV: 92.5 fL (ref 80.0–100.0)
Monocytes Absolute: 0.8 10*3/uL (ref 0.1–1.0)
Monocytes Relative: 7 %
Neutro Abs: 6 10*3/uL (ref 1.7–7.7)
Neutrophils Relative %: 53 %
Platelets: 423 10*3/uL — ABNORMAL HIGH (ref 150–400)
RBC: 4.28 MIL/uL (ref 3.87–5.11)
RDW: 13.2 % (ref 11.5–15.5)
WBC: 11.1 10*3/uL — ABNORMAL HIGH (ref 4.0–10.5)
nRBC: 0 % (ref 0.0–0.2)

## 2022-09-23 LAB — BASIC METABOLIC PANEL
Anion gap: 7 (ref 5–15)
BUN: 24 mg/dL — ABNORMAL HIGH (ref 6–20)
CO2: 23 mmol/L (ref 22–32)
Calcium: 8.8 mg/dL — ABNORMAL LOW (ref 8.9–10.3)
Chloride: 106 mmol/L (ref 98–111)
Creatinine, Ser: 0.7 mg/dL (ref 0.44–1.00)
GFR, Estimated: >60 mL/min (ref >60)
Glucose, Bld: 99 mg/dL (ref 70–99)
Potassium: 4 mmol/L (ref 3.5–5.1)
Sodium: 136 mmol/L (ref 135–145)

## 2022-09-23 LAB — I-STAT BETA HCG BLOOD, ED (MC, WL, AP ONLY): I-stat hCG, quantitative: <5 m[IU]/mL (ref <5)

## 2022-09-23 MED ORDER — PREDNISONE 50 MG PO TABS: ORAL_TABLET | ORAL | 9 refills | Status: DC

## 2022-09-23 NOTE — Discharge Instructions (Addendum)
Follow up with your Physician for recheck. See the Orthopaedist if pain persist
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
## 2022-09-27 ENCOUNTER — Other Ambulatory Visit (HOSPITAL_COMMUNITY): Payer: Self-pay | Admitting: Psychiatry

## 2022-09-27 DIAGNOSIS — F411 Generalized anxiety disorder: Secondary | ICD-10-CM

## 2022-09-27 DIAGNOSIS — F53 Postpartum depression: Secondary | ICD-10-CM

## 2022-09-29 DIAGNOSIS — Z419 Encounter for procedure for purposes other than remedying health state, unspecified: Secondary | ICD-10-CM | POA: Diagnosis not present

## 2022-10-01 ENCOUNTER — Ambulatory Visit (HOSPITAL_COMMUNITY): Payer: Medicaid Other | Admitting: Mental Health

## 2022-10-15 ENCOUNTER — Ambulatory Visit (HOSPITAL_COMMUNITY): Payer: Medicaid Other | Admitting: Mental Health

## 2022-10-15 ENCOUNTER — Telehealth (HOSPITAL_COMMUNITY): Payer: Self-pay | Admitting: Mental Health

## 2022-10-15 NOTE — Telephone Encounter (Signed)
Therapist connected to [place] with use of [place] interpreter. Upon connection to pt; pt unable to hear and unable to be heard. Unsuccessful in trouble shooting call to gain sound. Attempted telephone contact via [place] with pt reporting echo and difficulty understanding due to echo. Rescheduled appointment for in person session 10/31 @ 8am

## 2022-10-29 ENCOUNTER — Ambulatory Visit (HOSPITAL_COMMUNITY): Payer: Medicaid Other | Admitting: Mental Health

## 2022-10-29 ENCOUNTER — Inpatient Hospital Stay: Payer: Medicaid Other | Admitting: Nurse Practitioner

## 2022-10-30 DIAGNOSIS — Z419 Encounter for procedure for purposes other than remedying health state, unspecified: Secondary | ICD-10-CM | POA: Diagnosis not present

## 2022-11-20 ENCOUNTER — Telehealth (INDEPENDENT_AMBULATORY_CARE_PROVIDER_SITE_OTHER): Payer: Medicaid Other | Admitting: Psychiatry

## 2022-11-20 ENCOUNTER — Encounter (HOSPITAL_COMMUNITY): Payer: Self-pay | Admitting: Psychiatry

## 2022-11-20 DIAGNOSIS — F411 Generalized anxiety disorder: Secondary | ICD-10-CM | POA: Diagnosis not present

## 2022-11-20 DIAGNOSIS — F53 Postpartum depression: Secondary | ICD-10-CM | POA: Diagnosis not present

## 2022-11-20 NOTE — Progress Notes (Signed)
BH MD/PA/NP OP Progress Note Virtual Visit via Telephone Note  I connected with Kristen HemanHilda Rea Paules on 11/20/22 at  3:30 PM EST by telephone and verified that I am speaking with the correct person using two identifiers.  Location: Patient: home Provider: Clinic   I discussed the limitations, risks, security and privacy concerns of performing an evaluation and management service by telephone and the availability of in person appointments. I also discussed with the patient that there may be a patient responsible charge related to this service. The patient expressed understanding and agreed to proceed.   I provided 30 minutes of non-face-to-face time during this encounter.  11/20/2022 2:00 PM Kristen Frank  MRN:  [id number]  Chief Complaint: " I stopped my medications"  HPI:  36 year old female seen today for follow-up psychiatric evaluation.  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  Currently she is managed on Celexa 20 mg daily , prazosin 1 mg nightly, and hydroxyzine 25 mg 3 times daily as needed.  She reports that she discontinued her medications and feels mentally stable  Today provider utilized a Spanish interpreter as patient is Spanish-speaking.  She informed Clinical research associatewriter that 2 weeks ago she discontinued her medication.  She notes that she no longer felt that she needed them.  Patient notes that since her last visit her mood is more stable and notes that her anxiety and depression has improved.  Provider conducted a GAD-7 and patient scored a 7, at her last visit she scored a 21.  Provider also conducted PHQ-9 patient scored a 3, at her last visit she scored a 27.  She endorses adequate sleep and appetite.  Patient notes that her nightmares also has improved since her last visit.  Today she denies SI/HI/VAH, mania, paranoia.    At this time patient notes that she does not need refill of medications.  She notes that she would like to follow-up with writer for a future  visit to determine if medications need to be restarted.  No other concerns at this time.    Visit Diagnosis:    ICD-10-CM   1. Post-partum depression  F53.0     2. Generalized anxiety disorder  F41.1       Past Psychiatric History: Anxiety, depression , PTSD  Past Medical History:  Past Medical History:  Diagnosis Date   GERD (gastroesophageal reflux disease)    History of depression    HSV-2 infection    Idiopathic urticaria 07/26/2021   Preeclampsia 04/20/2012    Past Surgical History:  Procedure Laterality Date   No past surgery      Family Psychiatric History: Denies  Family History:  Family History  Problem Relation Age of Onset   Kidney disease Maternal Uncle    Cervical cancer Maternal Grandmother    Cancer Other    Hypertension Other    Diabetes Other    Heart disease Neg Hx     Social History:  Social History   Socioeconomic History   Marital status: Single    Spouse name: Not on file   Number of children: 3   Years of education: Not on file   Highest education level: Not on file  Occupational History   Not on file  Tobacco Use   Smoking status: Never   Smokeless tobacco: Never  Vaping Use   Vaping Use: Never used  Substance and Sexual Activity   Alcohol use: No   Drug use: No   Sexual activity: Not Currently  Birth control/protection: None  Other Topics Concern   Not on file  Social History Narrative   ** Merged History Encounter **       Social Determinants of Health   Financial Resource Strain: Low Risk  (09/17/2022)   Overall Financial Resource Strain (CARDIA)    Difficulty of Paying Living Expenses: Not hard at all  Food Insecurity: No Food Insecurity (04/05/2021)   Hunger Vital Sign    Worried About Running Out of Food in the Last Year: Never true    Ran Out of Food in the Last Year: Never true  Transportation Needs: No Transportation Needs (09/17/2022)   PRAPARE - Administrator, Civil Service (Medical): No    Lack  of Transportation (Non-Medical): No  Physical Activity: Inactive (09/17/2022)   Exercise Vital Sign    Days of Exercise per Week: 0 days    Minutes of Exercise per Session: 0 min  Stress: Stress Concern Present (09/17/2022)   [employer] of Occupational Health - Occupational Stress Questionnaire    Feeling of Stress : Very much  Social Connections: Socially Isolated (09/17/2022)   Social Connection and Isolation Panel [NHANES]    Frequency of Communication with Friends and Family: Never    Frequency of Social Gatherings with Friends and Family: Never    Attends Religious Services: Never    Database administrator or Organizations: No    Attends Banker Meetings: Never    Marital Status: Never married    Allergies:  Allergies  Allergen Reactions   Gold Sodium Thiosulfate    Nickel    Other     Seafood, Lidocaine, Gold, Red meat,lentils,eggs, pecans    Metabolic Disorder Labs: Lab Results  Component Value Date   HGBA1C 6.0 (H) 11/09/2020   No results found for: "PROLACTIN" No results found for: "CHOL", "TRIG", "HDL", "CHOLHDL", "VLDL", "LDLCALC" Lab Results  Component Value Date   TSH 2.658 01/20/2019    Therapeutic Level Labs: No results found for: "LITHIUM" No results found for: "VALPROATE" No results found for: "CBMZ"  Current Medications: Current Outpatient Medications  Medication Sig Dispense Refill   acetaminophen (TYLENOL) 325 MG tablet Take 2 tablets (650 mg total) by mouth every 4 (four) hours as needed (for pain scale < 4). 30 tablet 0   citalopram (CELEXA) 20 MG tablet TAKE 1 TABLET BY MOUTH EVERY DAY 90 tablet 2   cyclobenzaprine (FLEXERIL) 10 MG tablet Take 1 tablet (10 mg total) by mouth 3 (three) times daily as needed for muscle spasms. 30 tablet 2   EPINEPHrine (EPIPEN 2-PAK) 0.3 mg/0.3 mL IJ SOAJ injection Inject 0.3 mg into the muscle once as needed for up to 1 dose (for severe allergic reaction). CAll 911 immediately if you have to use  this medicine 1 each 1   hydrOXYzine (ATARAX) 25 MG tablet TAKE 1 TABLET BY MOUTH THREE TIMES A DAY 270 tablet 2   ibuprofen (ADVIL) 600 MG tablet Take 1 tablet (600 mg total) by mouth every 6 (six) hours. 30 tablet 0   omeprazole (PRILOSEC) 20 MG capsule Take 20 mg by mouth daily.     polyethylene glycol (MIRALAX) 17 g packet Take 17 g by mouth daily as needed for mild constipation or moderate constipation. 14 each 2   prazosin (MINIPRESS) 1 MG capsule Take 1 capsule (1 mg total) by mouth at bedtime. 30 capsule 3   predniSONE (DELTASONE) 50 MG tablet One tablet a day 5 tablet 9   Prenatal  Vit-Fe Fumarate-FA (PRENATAL MULTIVITAMIN) TABS tablet Take 1 tablet by mouth daily at 12 noon.     senna-docusate (SENOKOT-S) 8.6-50 MG tablet Take 2 tablets by mouth daily. 30 tablet 2   valACYclovir (VALTREX) 500 MG tablet Take 500 mg by mouth daily.     No current facility-administered medications for this visit.     Musculoskeletal: Strength & Muscle Tone:  Unable to assess due to telephone visit Gait & Station:  Unable to assess due to telephone visit Patient leans: N/A  Psychiatric Specialty Exam: Review of Systems  unknown if currently breastfeeding.There is no height or weight on file to calculate BMI.  General Appearance:  Unable to assess due to telephone visit  Eye Contact:   Unable to assess due to telephone visit  Speech:  Clear and Coherent and Normal Rate  Volume:  Normal  Mood:  Euthymic  Affect:  Appropriate and Congruent  Thought Process:  Coherent, Goal Directed, and Linear  Orientation:  Full (Time, Place, and Person)  Thought Content: WDL and Logical   Suicidal Thoughts:  No  Homicidal Thoughts:  No  Memory:  Immediate;   Good Recent;   Good Remote;   Good  Judgement:  Good  Insight:  Good  Psychomotor Activity:   Unable to assess due to telehealth visit  Concentration:  Concentration: Good and Attention Span: Good  Recall:  Good  Fund of Knowledge: Good  Language:  Good  Akathisia:   Unable to assess due to telephone visit  Handed:  Right  AIMS (if indicated): not done  Assets:  Communication Skills Desire for Improvement Financial Resources/Insurance Housing Intimacy Physical Health Social Support  ADL's:  Intact  Cognition: WNL  Sleep:  Good   Screenings: AIMS    Flowsheet Row [facility]  AIMS Total Score 0      AUDIT    Flowsheet Row [facility]  Alcohol Use Disorder Identification Test Final Score (AUDIT) 0      GAD-7    Flowsheet Row Video Visit from 11/20/2022 in [facility] Counselor from 09/17/2022 in [facility] Office Visit from 08/27/2022 in [facility] from 10/16/2021 in Primary Care at [facility] from 09/25/2021 in Primary Care at [facility]  Total GAD-7 Score 7 21 20 21 21       PHQ2-9    Flowsheet Row Video Visit from 11/20/2022 in [facility] Counselor from 09/17/2022 in [facility] Office Visit from 08/27/2022 in [facility] from 10/16/2021 in [facility] at [facility] from 09/25/2021 in Primary Care at [facility]  PHQ-2 Total Score 1 6 6 6 6   PHQ-9 Total Score 3 27 25 24 24       Flowsheet Row ED from 09/23/2022 in [facility] EMERGENCY DEPARTMENT Counselor from 09/17/2022 in [facility] Office Visit from 08/27/2022 in [facility]  C-SSRS RISK CATEGORY No Risk Error: Q3, 4, or 5 should not be populated when Q2 is No Error: Q3, 4, or 5 should not be populated when Q2 is No        Assessment and  Plan: Patient notes that she discontinued her medication 2 weeks ago and does not wish to restart it.  She reports her mood, anxiety, depression, and sleep are well managed.  At this time she notes that she does not wish to refill medications.  She does note that she would like to follow-up in the future to assess if medications are needed.  1. Post-partum depression   2. Generalized anxiety disorder      Collaboration of Care: Collaboration of Care: Other provider involved in patient's care AEB PCP  Patient/Guardian was advised Release of Information must be obtained prior to any record release in order to collaborate their care with an outside provider. Patient/Guardian was advised if they have not already done so to contact the registration department to sign all necessary forms in order for [place] to release information regarding their care.   Consent: Patient/Guardian gives verbal consent for treatment and assignment of benefits for services provided during this visit. Patient/Guardian expressed understanding and agreed to proceed.   Follow-up in 3 months   Shanna Cisco, NP 11/20/2022, 2:00 PM

## 2022-11-29 DIAGNOSIS — Z419 Encounter for procedure for purposes other than remedying health state, unspecified: Secondary | ICD-10-CM | POA: Diagnosis not present

## 2022-12-30 DIAGNOSIS — Z419 Encounter for procedure for purposes other than remedying health state, unspecified: Secondary | ICD-10-CM | POA: Diagnosis not present

## 2023-01-30 DIAGNOSIS — Z419 Encounter for procedure for purposes other than remedying health state, unspecified: Secondary | ICD-10-CM | POA: Diagnosis not present

## 2023-02-19 ENCOUNTER — Encounter (HOSPITAL_COMMUNITY): Payer: Self-pay | Admitting: Psychiatry

## 2023-02-19 ENCOUNTER — Telehealth (INDEPENDENT_AMBULATORY_CARE_PROVIDER_SITE_OTHER): Payer: Medicaid Other | Admitting: Psychiatry

## 2023-02-19 DIAGNOSIS — F411 Generalized anxiety disorder: Secondary | ICD-10-CM | POA: Diagnosis not present

## 2023-02-19 DIAGNOSIS — F53 Postpartum depression: Secondary | ICD-10-CM

## 2023-02-19 MED ORDER — HYDROXYZINE HCL 25 MG PO TABS: 25.0000 mg | ORAL_TABLET | Freq: Three times a day (TID) | ORAL | 3 refills | Status: DC

## 2023-02-19 MED ORDER — PRAZOSIN HCL 1 MG PO CAPS: 1.0000 mg | ORAL_CAPSULE | Freq: Every day | ORAL | 3 refills | Status: DC

## 2023-02-19 MED ORDER — CITALOPRAM HYDROBROMIDE 20 MG PO TABS: 20.0000 mg | ORAL_TABLET | Freq: Every day | ORAL | 3 refills | Status: DC

## 2023-02-19 NOTE — Progress Notes (Signed)
BH MD/PA/NP OP Progress Note Virtual Visit via Video Note  I connected with Kristen Frank on 02/19/23 at  1:30 PM EST by a video enabled telemedicine application and verified that I am speaking with the correct person using two identifiers.  Location: Patient: Home Provider: Clinic   I discussed the limitations of evaluation and management by telemedicine and the availability of in person appointments. The patient expressed understanding and agreed to proceed.  I provided 30 minutes of non-face-to-face time during this encounter.    02/19/2023 2:54 PM Kristen Frank  MRN:  [id number]  Chief Complaint: "I restarted my medications"  HPI:  37 year old female seen today for follow-up psychiatric evaluation.  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  She informed Probation officer that restarted her medications but ran out two weeks ago. She notes that she she has not been doing well.  Today provider utilized a Spanish interpreter as patient is Spanish-speaking.  Patient logged in virtually for half of her visit but her audio would not stay connected. During exam she was pleasant, cooperative, engaged in conversation, and maintained eye contact. She notes that she restarted her medications after becoming depressed without them. Patient note she currently is worried about her 84-monthold son who has developmental delays and her older child who has autism.  Today provider conducted a GAD-7 and patient scored a 19, at her last visit she scored a 17.  Provider also conducted PHQ-9 and patient scored a 15, at her last visit she scored a 3.  She endorses fluctuations in sleep and appetite.  Today she endorses passive SI but denies wanting to harm herself.  Patient informed wProbation officerthat recently she has been hearing voices telling her that something will happen and then it does.  She informed wProbation officerthat recently the voice told her that there would be a accident and then there was.  Today  she denies SI/HI/VH, mania, paranoia.    At this time patient agreeable to restarting hydroxyzine 25 mg 3 times daily, prazosin 1 mg nightly, and Celexa 20  mg daily to help manage her anxiety and depression.  Provider informed patient that if she continues to hear voices at her next visit an antipsychotic could be trialed.  She endorsed understanding and agreed.  No other concerns noted at this time.    Visit Diagnosis:    ICD-10-CM   1. Generalized anxiety disorder  F41.1 citalopram (CELEXA) 20 MG tablet    hydrOXYzine (ATARAX) 25 MG tablet    2. Post-partum depression  F53.0 citalopram (CELEXA) 20 MG tablet    prazosin (MINIPRESS) 1 MG capsule      Past Psychiatric History: Anxiety, depression , PTSD  Past Medical History:  Past Medical History:  Diagnosis Date   GERD (gastroesophageal reflux disease)    History of depression    HSV-2 infection    Idiopathic urticaria 07/26/2021   Preeclampsia 04/20/2012    Past Surgical History:  Procedure Laterality Date   No past surgery      Family Psychiatric History: Denies  Family History:  Family History  Problem Relation Age of Onset   Kidney disease Maternal Uncle    Cervical cancer Maternal Grandmother    Cancer Other    Hypertension Other    Diabetes Other    Heart disease Neg Hx     Social History:  Social History   Socioeconomic History   Marital status: Single    Spouse name: Not on file  Number of children: 3   Years of education: Not on file   Highest education level: Not on file  Occupational History   Not on file  Tobacco Use   Smoking status: Never   Smokeless tobacco: Never  Vaping Use   Vaping Use: Never used  Substance and Sexual Activity   Alcohol use: No   Drug use: No   Sexual activity: Not Currently    Birth control/protection: None  Other Topics Concern   Not on file  Social History Narrative   ** Merged History Encounter **       Social Determinants of Health   Financial Resource  Strain: Low Risk  (09/17/2022)   Overall Financial Resource Strain (CARDIA)    Difficulty of Paying Living Expenses: Not hard at all  Food Insecurity: No Food Insecurity (04/05/2021)   Hunger Vital Sign    Worried About Running Out of Food in the Last Year: Never true    Ran Out of Food in the Last Year: Never true  Transportation Needs: No Transportation Needs (09/17/2022)   PRAPARE - Hydrologist (Medical): No    Lack of Transportation (Non-Medical): No  Physical Activity: Inactive (09/17/2022)   Exercise Vital Sign    Days of Exercise per Week: 0 days    Minutes of Exercise per Session: 0 min  Stress: Stress Concern Present (09/17/2022)   [place]    Feeling of Stress : Very much  Social Connections: Socially Isolated (09/17/2022)   Social Connection and Isolation Panel [NHANES]    Frequency of Communication with Friends and Family: Never    Frequency of Social Gatherings with Friends and Family: Never    Attends Religious Services: Never    Marine scientist or Organizations: No    Attends Archivist Meetings: Never    Marital Status: Never married    Allergies:  Allergies  Allergen Reactions   Gold Sodium Thiosulfate    Nickel    Other     Seafood, Lidocaine, Gold, Red meat,lentils,eggs, pecans    Metabolic Disorder Labs: Lab Results  Component Value Date   HGBA1C 6.0 (H) 11/09/2020   No results found for: "PROLACTIN" No results found for: "CHOL", "TRIG", "HDL", "CHOLHDL", "VLDL", "LDLCALC" Lab Results  Component Value Date   TSH 2.658 01/20/2019    Therapeutic Level Labs: No results found for: "LITHIUM" No results found for: "VALPROATE" No results found for: "CBMZ"  Current Medications: Current Outpatient Medications  Medication Sig Dispense Refill   acetaminophen (TYLENOL) 325 MG tablet Take 2 tablets (650 mg total) by mouth every 4 (four) hours as  needed (for pain scale < 4). 30 tablet 0   citalopram (CELEXA) 20 MG tablet Take 1 tablet (20 mg total) by mouth daily. 90 tablet 3   cyclobenzaprine (FLEXERIL) 10 MG tablet Take 1 tablet (10 mg total) by mouth 3 (three) times daily as needed for muscle spasms. 30 tablet 2   EPINEPHrine (EPIPEN 2-PAK) 0.3 mg/0.3 mL IJ SOAJ injection Inject 0.3 mg into the muscle once as needed for up to 1 dose (for severe allergic reaction). CAll 911 immediately if you have to use this medicine 1 each 1   hydrOXYzine (ATARAX) 25 MG tablet Take 1 tablet (25 mg total) by mouth 3 (three) times daily. 270 tablet 3   ibuprofen (ADVIL) 600 MG tablet Take 1 tablet (600 mg total) by mouth every 6 (six) hours. [place]  tablet 0   omeprazole (PRILOSEC) 20 MG capsule Take 20 mg by mouth daily.     polyethylene glycol (MIRALAX) 17 g packet Take 17 g by mouth daily as needed for mild constipation or moderate constipation. 14 each 2   prazosin (MINIPRESS) 1 MG capsule Take 1 capsule (1 mg total) by mouth at bedtime. 30 capsule 3   predniSONE (DELTASONE) 50 MG tablet One tablet a day 5 tablet 9   Prenatal Vit-Fe Fumarate-FA (PRENATAL MULTIVITAMIN) TABS tablet Take 1 tablet by mouth daily at 12 noon.     senna-docusate (SENOKOT-S) 8.6-50 MG tablet Take 2 tablets by mouth daily. 30 tablet 2   valACYclovir (VALTREX) 500 MG tablet Take 500 mg by mouth daily.     No current facility-administered medications for this visit.     Musculoskeletal: Strength & Muscle Tone: within normal limits and telehealth visit Gait & Station: normal, telehealth visit Patient leans: N/A  Psychiatric Specialty Exam: Review of Systems  unknown if currently breastfeeding.There is no height or weight on file to calculate BMI.  General Appearance: Well Groomed  Eye Contact:  Good  Speech:  Clear and Coherent and Normal Rate  Volume:  Normal  Mood:  Depressed and Dysphoric  Affect:  Appropriate and Congruent  Thought Process:  Coherent, Goal Directed,  and Linear  Orientation:  Full (Time, Place, and Person)  Thought Content: WDL and Logical   Suicidal Thoughts:  No  Homicidal Thoughts:  No  Memory:  Immediate;   Good Recent;   Good Remote;   Good  Judgement:  Good  Insight:  Good  Psychomotor Activity:  Normal  Concentration:  Concentration: Good and Attention Span: Good  Recall:  Good  Fund of Knowledge: Good  Language: Good  Akathisia:  No  Handed:  Right  AIMS (if indicated): not done  Assets:  Communication Skills Desire for Improvement Financial Resources/Insurance Housing Intimacy Physical Health Social Support  ADL's:  Intact  Cognition: WNL  Sleep:  Good   Screenings: AIMS    Flowsheet Row Admission (Discharged) from 01/18/2019 in [place] [facility]  AIMS Total Score 0      AUDIT    Flowsheet Row Admission (Discharged) from 01/18/2019 in [place] [facility]  Alcohol Use Disorder Identification Test Final Score (AUDIT) 0      GAD-7    Flowsheet Row Video Visit from 02/19/2023 in [facility] Video Visit from 11/20/2022 in [facility] Counselor from 09/17/2022 in [facility] Office Visit from 08/27/2022 in [place] from 10/16/2021 in [place] [facility]  Total GAD-7 Score 19 7 21 20 21      $ PHQ2-9    Flowsheet Row Video Visit from 02/19/2023 in [facility] Video Visit from 11/20/2022 in [facility] Counselor from 09/17/2022 in [facility] Office Visit from 08/27/2022 in [place] from 10/16/2021 in [place] at [facility]  PHQ-2 Total Score 5 1 6 6 6  $ PHQ-9 Total Score 15 3 27 25 24      $ Flowsheet Row Video Visit from  02/19/2023 in [facility] ED from 09/23/2022 in [facility] Emergency Department at [facility] Counselor from 09/17/2022 in [place] Error: Q7 should not be  populated when Q6 is No No Risk Error: Q3, 4, or 5 should not be populated when Q2 is No        Assessment and Plan: Patient informed writer that she restarted her medications after becoming depressed and anxious without them.  She is also worried about her children who has developmental delays and autism.  Today she is agreeable to restarting Celexa 20 mg daily, hydroxyzine 25 mg 3 times daily as needed, and prazosin 1 mg nightly to help manage her anxiety and depression. 1. Generalized anxiety disorder  Restart- citalopram (CELEXA) 20 MG tablet; Take 1 tablet (20 mg total) by mouth daily.  Dispense: 90 tablet; Refill: 3 Restart- hydrOXYzine (ATARAX) 25 MG tablet; Take 1 tablet (25 mg total) by mouth 3 (three) times daily.  Dispense: 270 tablet; Refill: 3  2. Post-partum depression  Restart- citalopram (CELEXA) 20 MG tablet; Take 1 tablet (20 mg total) by mouth daily.  Dispense: 90 tablet; Refill: 3 Restart- prazosin (MINIPRESS) 1 MG capsule; Take 1 capsule (1 mg total) by mouth at bedtime.  Dispense: 30 capsule; Refill: 3       Follow-up in 3 months   Salley Slaughter, NP 02/19/2023, 2:54 PM

## 2023-02-28 DIAGNOSIS — Z419 Encounter for procedure for purposes other than remedying health state, unspecified: Secondary | ICD-10-CM | POA: Diagnosis not present

## 2023-03-31 DIAGNOSIS — Z419 Encounter for procedure for purposes other than remedying health state, unspecified: Secondary | ICD-10-CM | POA: Diagnosis not present

## 2023-05-07 ENCOUNTER — Telehealth (INDEPENDENT_AMBULATORY_CARE_PROVIDER_SITE_OTHER): Payer: Medicaid Other | Admitting: Psychiatry

## 2023-05-07 ENCOUNTER — Encounter (HOSPITAL_COMMUNITY): Payer: Self-pay | Admitting: Psychiatry

## 2023-05-07 DIAGNOSIS — F411 Generalized anxiety disorder: Secondary | ICD-10-CM | POA: Diagnosis not present

## 2023-05-07 DIAGNOSIS — F53 Postpartum depression: Secondary | ICD-10-CM | POA: Diagnosis not present

## 2023-05-07 MED ORDER — CITALOPRAM HYDROBROMIDE 20 MG PO TABS: 20.0000 mg | ORAL_TABLET | Freq: Every day | ORAL | 3 refills | Status: DC

## 2023-05-07 MED ORDER — HYDROXYZINE HCL 25 MG PO TABS: 25.0000 mg | ORAL_TABLET | Freq: Three times a day (TID) | ORAL | 3 refills | Status: DC

## 2023-05-07 MED ORDER — PRAZOSIN HCL 1 MG PO CAPS: 1.0000 mg | ORAL_CAPSULE | Freq: Every day | ORAL | 3 refills | Status: DC

## 2023-05-07 NOTE — Progress Notes (Signed)
BH MD/PA/NP OP Progress Note Virtual Visit via Video Note  I connected with Kristen Frank on 05/07/23 at  2:30 PM EDT by a video enabled telemedicine application and verified that I am speaking with the correct person using two identifiers.  Location: Patient: Home Provider: Clinic   I discussed the limitations of evaluation and management by telemedicine and the availability of in person appointments. The patient expressed understanding and agreed to proceed.  I provided 30 minutes of non-face-to-face time during this encounter.    05/07/2023 2:49 PM Kristen Frank  MRN:  [id number]  Chief Complaint: "I I am feeling better"  HPI:  37 year old female seen today for follow-up psychiatric evaluation.  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  She is currently managed on hydroxyzine 25 mg 3 times daily, prazosin 1 mg nightly, and Celexa 20  mg daily.  She notes that her medications are effective in managing her psychiatric conditions.    Today provider utilized a Spanish interpreter as patient is Spanish-speaking.  Patient logged in virtually for half of her visit but her audio would not stay connected. During exam she was pleasant, cooperative, engaged in conversation, and maintained eye contact. She informed Clinical research associate that since restarting her medications her anxiety and depression has improved.  Today provider conducted a GAD-7 and patient scored a 10, at her last visit she scored a 19.  Provider also conducted PHQ-9 and patient scored a 15, at her last visit she scored a 15.  She endorses adequate sleep and increased appetite.  Patient notes that she has gained 7 pounds since her last visit.  Today she denies SI/HI/VH, mania, or paranoia.   Patient has not had labs drawn in over 6 months today provider ordered CBC, CMP, LFT, thyroid panel, HgbA1c, and lipid panel.  No medication changes made today.  Patient to continue medications as prescribed.  Provider recommended  patient coming into her next visit as her audio to have problems.  She endorsed understanding and agreed.  No other concerns noted at this time.    Visit Diagnosis:    ICD-10-CM   1. Generalized anxiety disorder  F41.1 citalopram (CELEXA) 20 MG tablet    hydrOXYzine (ATARAX) 25 MG tablet    2. Post-partum depression  F53.0 CBC w/Diff/Platelet    Lipid Profile    Comprehensive Metabolic Panel (CMET)    HgB A1c    Hepatic function panel    Thyroid Panel With TSH    citalopram (CELEXA) 20 MG tablet    prazosin (MINIPRESS) 1 MG capsule      Past Psychiatric History: Anxiety, depression , PTSD  Past Medical History:  Past Medical History:  Diagnosis Date   GERD (gastroesophageal reflux disease)    History of depression    HSV-2 infection    Idiopathic urticaria 07/26/2021   Preeclampsia 04/20/2012    Past Surgical History:  Procedure Laterality Date   No past surgery      Family Psychiatric History: Denies  Family History:  Family History  Problem Relation Age of Onset   Kidney disease Maternal Uncle    Cervical cancer Maternal Grandmother    Cancer Other    Hypertension Other    Diabetes Other    Heart disease Neg Hx     Social History:  Social History   Socioeconomic History   Marital status: Single    Spouse name: Not on file   Number of children: 3   Years of education: Not  on file   Highest education level: Not on file  Occupational History   Not on file  Tobacco Use   Smoking status: Never   Smokeless tobacco: Never  Vaping Use   Vaping Use: Never used  Substance and Sexual Activity   Alcohol use: No   Drug use: No   Sexual activity: Not Currently    Birth control/protection: None  Other Topics Concern   Not on file  Social History Narrative   ** Merged History Encounter **       Social Determinants of Health   Financial Resource Strain: Low Risk  (09/17/2022)   Overall Financial Resource Strain (CARDIA)    Difficulty of Paying Living  Expenses: Not hard at all  Food Insecurity: No Food Insecurity (04/05/2021)   Hunger Vital Sign    Worried About Running Out of Food in the Last Year: Never true    Ran Out of Food in the Last Year: Never true  Transportation Needs: No Transportation Needs (09/17/2022)   PRAPARE - Administrator, Civil Service (Medical): No    Lack of Transportation (Non-Medical): No  Physical Activity: Inactive (09/17/2022)   Exercise Vital Sign    Days of Exercise per Week: 0 days    Minutes of Exercise per Session: 0 min  Stress: Stress Concern Present (09/17/2022)   [employer] of Occupational Health - Occupational Stress Questionnaire    Feeling of Stress : Very much  Social Connections: Socially Isolated (09/17/2022)   Social Connection and Isolation Panel [NHANES]    Frequency of Communication with Friends and Family: Never    Frequency of Social Gatherings with Friends and Family: Never    Attends Religious Services: Never    Database administrator or Organizations: No    Attends Banker Meetings: Never    Marital Status: Never married    Allergies:  Allergies  Allergen Reactions   Gold Sodium Thiosulfate    Nickel    Other     Seafood, Lidocaine, Gold, Red meat,lentils,eggs, pecans    Metabolic Disorder Labs: Lab Results  Component Value Date   HGBA1C 6.0 (H) 11/09/2020   No results found for: "PROLACTIN" No results found for: "CHOL", "TRIG", "HDL", "CHOLHDL", "VLDL", "LDLCALC" Lab Results  Component Value Date   TSH 2.658 01/20/2019    Therapeutic Level Labs: No results found for: "LITHIUM" No results found for: "VALPROATE" No results found for: "CBMZ"  Current Medications: Current Outpatient Medications  Medication Sig Dispense Refill   acetaminophen (TYLENOL) 325 MG tablet Take 2 tablets (650 mg total) by mouth every 4 (four) hours as needed (for pain scale < 4). 30 tablet 0   citalopram (CELEXA) 20 MG tablet Take 1 tablet (20 mg total) by  mouth daily. 90 tablet 3   cyclobenzaprine (FLEXERIL) 10 MG tablet Take 1 tablet (10 mg total) by mouth 3 (three) times daily as needed for muscle spasms. 30 tablet 2   EPINEPHrine (EPIPEN 2-PAK) 0.3 mg/0.3 mL IJ SOAJ injection Inject 0.3 mg into the muscle once as needed for up to 1 dose (for severe allergic reaction). CAll 911 immediately if you have to use this medicine 1 each 1   hydrOXYzine (ATARAX) 25 MG tablet Take 1 tablet (25 mg total) by mouth 3 (three) times daily. 270 tablet 3   ibuprofen (ADVIL) 600 MG tablet Take 1 tablet (600 mg total) by mouth every 6 (six) hours. 30 tablet 0   omeprazole (PRILOSEC) 20 MG capsule Take  20 mg by mouth daily.     polyethylene glycol (MIRALAX) 17 g packet Take 17 g by mouth daily as needed for mild constipation or moderate constipation. 14 each 2   prazosin (MINIPRESS) 1 MG capsule Take 1 capsule (1 mg total) by mouth at bedtime. 30 capsule 3   predniSONE (DELTASONE) 50 MG tablet One tablet a day 5 tablet 9   Prenatal Vit-Fe Fumarate-FA (PRENATAL MULTIVITAMIN) TABS tablet Take 1 tablet by mouth daily at 12 noon.     senna-docusate (SENOKOT-S) 8.6-50 MG tablet Take 2 tablets by mouth daily. 30 tablet 2   valACYclovir (VALTREX) 500 MG tablet Take 500 mg by mouth daily.     No current facility-administered medications for this visit.     Musculoskeletal: Strength & Muscle Tone: within normal limits and telehealth visit Gait & Station: normal, telehealth visit Patient leans: N/A  Psychiatric Specialty Exam: Review of Systems  unknown if currently breastfeeding.There is no height or weight on file to calculate BMI.  General Appearance: Well Groomed  Eye Contact:  Good  Speech:  Clear and Coherent and Normal Rate  Volume:  Normal  Mood:  Depressed and anxiety improving  Affect:  Appropriate and Congruent  Thought Process:  Coherent, Goal Directed, and Linear  Orientation:  Full (Time, Place, and Person)  Thought Content: WDL and Logical    Suicidal Thoughts:  No  Homicidal Thoughts:  No  Memory:  Immediate;   Good Recent;   Good Remote;   Good  Judgement:  Good  Insight:  Good  Psychomotor Activity:  Normal  Concentration:  Concentration: Good and Attention Span: Good  Recall:  Good  Fund of Knowledge: Good  Language: Good  Akathisia:  No  Handed:  Right  AIMS (if indicated): not done  Assets:  Communication Skills Desire for Improvement Financial Resources/Insurance Housing Intimacy Physical Health Social Support  ADL's:  Intact  Cognition: WNL  Sleep:  Good   Screenings: AIMS    Flowsheet [facility]  AIMS Total Score 0      AUDIT    Flowsheet Row [facility]  Alcohol Use Disorder Identification Test Final Score (AUDIT) 0      GAD-7    Flowsheet Row Video Visit from 05/07/2023 in [facility] Video Visit from 02/19/2023 in [facility] Video Visit from 11/20/2022 in [facility] Counselor from 09/17/2022 in [facility] Office Visit from 08/27/2022 in [facility]  Total GAD-7 Score 10 19 7 21 20       PHQ2-9    Flowsheet Row Video Visit from 05/07/2023 in [facility] Video Visit from 02/19/2023 in [facility] Video Visit from 11/20/2022 in [facility] Counselor from 09/17/2022 in [facility] Office Visit from 08/27/2022 in [facility]  PHQ-2 Total Score 3 5 1 6 6   PHQ-9 Total Score 15 15 3 27 25       Flowsheet Row Video Visit from 05/07/2023 in [facility] Video Visit from 02/19/2023 in [facility] ED from 09/23/2022  in [facility] Emergency Department at [facility]  C-SSRS RISK CATEGORY Low Risk Error: Q7 should not be populated when Q6 is No No Risk  Assessment and Plan: Patient informed writer that her anxiety and depression has improved since her last visit.  Patient has not had labs drawn in over 6 months today provider ordered CBC, CMP, LFT, thyroid panel, HgbA1c, and lipid panel.  No medication changes made today.  Patient to continue medications as prescribed. Provider recommended patient coming into her next visit as her audio to have problems.  She endorsed understanding and agreed.  1. Generalized anxiety disorder  Continue- citalopram (CELEXA) 20 MG tablet; Take 1 tablet (20 mg total) by mouth daily.  Dispense: 90 tablet; Refill: 3 Continue- hydrOXYzine (ATARAX) 25 MG tablet; Take 1 tablet (25 mg total) by mouth 3 (three) times daily.  Dispense: 270 tablet; Refill: 3  2. Post-partum depression  Continue- citalopram (CELEXA) 20 MG tablet; Take 1 tablet (20 mg total) by mouth daily.  Dispense: 90 tablet; Refill: 3 Continue- prazosin (MINIPRESS) 1 MG capsule; Take 1 capsule (1 mg total) by mouth at bedtime.  Dispense: 30 capsule; Refill: 3       Follow-up in 2.5 months   Shanna Cisco, NP 05/07/2023, 2:49 PM

## 2023-05-12 ENCOUNTER — Encounter (HOSPITAL_COMMUNITY): Payer: Self-pay

## 2023-05-12 ENCOUNTER — Other Ambulatory Visit (HOSPITAL_COMMUNITY): Payer: Self-pay

## 2023-05-12 VITALS — BP 111/83 | HR 82

## 2023-05-12 DIAGNOSIS — Z79899 Other long term (current) drug therapy: Secondary | ICD-10-CM

## 2023-05-12 DIAGNOSIS — F411 Generalized anxiety disorder: Secondary | ICD-10-CM

## 2023-05-12 DIAGNOSIS — F53 Postpartum depression: Secondary | ICD-10-CM

## 2023-05-12 NOTE — Progress Notes (Cosign Needed)
Patient in today for due ordered labs and presented with appropriate affect, pleasant and level mood  and denied any current symptoms or problems with medications at this time.  No suicidal or homicidal ideations and labs drawn as ordered. Patient tolerated without any complaint of pain or discomfort and will follow up on labs with next scheduled evaluation.

## 2023-05-13 LAB — CBC WITH DIFFERENTIAL/PLATELET
Basophils Absolute: 0 10*3/uL (ref 0.0–0.2)
Basos: 1 %
EOS (ABSOLUTE): 0.1 10*3/uL (ref 0.0–0.4)
Eos: 1 %
Hematocrit: 45.4 % (ref 34.0–46.6)
Hemoglobin: 14.8 g/dL (ref 11.1–15.9)
Immature Grans (Abs): 0 10*3/uL (ref 0.0–0.1)
Immature Granulocytes: 0 %
Lymphocytes Absolute: 2.5 10*3/uL (ref 0.7–3.1)
Lymphs: 29 %
MCH: 29.9 pg (ref 26.6–33.0)
MCHC: 32.6 g/dL (ref 31.5–35.7)
MCV: 92 fL (ref 79–97)
Monocytes Absolute: 0.5 10*3/uL (ref 0.1–0.9)
Monocytes: 6 %
Neutrophils Absolute: 5.4 10*3/uL (ref 1.4–7.0)
Neutrophils: 63 %
Platelets: 356 10*3/uL (ref 150–450)
RBC: 4.95 x10E6/uL (ref 3.77–5.28)
RDW: 12.4 % (ref 11.7–15.4)
WBC: 8.5 10*3/uL (ref 3.4–10.8)

## 2023-05-13 LAB — LIPID PANEL
Chol/HDL Ratio: 4.6 ratio — ABNORMAL HIGH (ref 0.0–4.4)
Cholesterol, Total: 202 mg/dL — ABNORMAL HIGH (ref 100–199)
HDL: 44 mg/dL (ref >39)
LDL Chol Calc (NIH): 141 mg/dL — ABNORMAL HIGH (ref 0–99)
Triglycerides: 91 mg/dL (ref 0–149)
VLDL Cholesterol Cal: 17 mg/dL (ref 5–40)

## 2023-05-13 LAB — COMPREHENSIVE METABOLIC PANEL
ALT: 27 IU/L (ref 0–32)
AST: 22 IU/L (ref 0–40)
Albumin/Globulin Ratio: 1.6 (ref 1.2–2.2)
Albumin: 4.5 g/dL (ref 3.9–4.9)
Alkaline Phosphatase: 88 IU/L (ref 44–121)
BUN/Creatinine Ratio: 13 (ref 9–23)
BUN: 11 mg/dL (ref 6–20)
Bilirubin Total: 0.3 mg/dL (ref 0.0–1.2)
CO2: 21 mmol/L (ref 20–29)
Calcium: 9.1 mg/dL (ref 8.7–10.2)
Chloride: 104 mmol/L (ref 96–106)
Creatinine, Ser: 0.84 mg/dL (ref 0.57–1.00)
Globulin, Total: 2.8 g/dL (ref 1.5–4.5)
Glucose: 116 mg/dL — ABNORMAL HIGH (ref 70–99)
Potassium: 5 mmol/L (ref 3.5–5.2)
Sodium: 139 mmol/L (ref 134–144)
Total Protein: 7.3 g/dL (ref 6.0–8.5)
eGFR: 92 mL/min/{1.73_m2} (ref >59)

## 2023-05-13 LAB — THYROID PANEL WITH TSH
Free Thyroxine Index: 1.6 (ref 1.2–4.9)
T3 Uptake Ratio: 25 % (ref 24–39)
T4, Total: 6.5 ug/dL (ref 4.5–12.0)
TSH: 3.68 u[IU]/mL (ref 0.450–4.500)

## 2023-05-13 LAB — HEMOGLOBIN A1C
Est. average glucose Bld gHb Est-mCnc: 126 mg/dL
Hgb A1c MFr Bld: 6 % — ABNORMAL HIGH (ref 4.8–5.6)

## 2023-05-13 LAB — HEPATIC FUNCTION PANEL: Bilirubin, Direct: <0.1 mg/dL (ref 0.00–0.40)

## 2023-05-27 ENCOUNTER — Other Ambulatory Visit: Payer: Self-pay

## 2023-05-27 ENCOUNTER — Emergency Department (HOSPITAL_COMMUNITY)
Admission: EM | Admit: 2023-05-27 | Discharge: 2023-05-28 | Discharge status: Against Medical Advice | Payer: Medicaid Other | Attending: Emergency Medicine | Admitting: Emergency Medicine

## 2023-05-27 DIAGNOSIS — R519 Headache, unspecified: Secondary | ICD-10-CM | POA: Insufficient documentation

## 2023-05-27 DIAGNOSIS — Z5321 Procedure and treatment not carried out due to patient leaving prior to being seen by health care provider: Secondary | ICD-10-CM | POA: Insufficient documentation

## 2023-05-27 NOTE — ED Triage Notes (Signed)
Patient reports headache and facial pain this afternoon , denies injury or fall .

## 2023-07-15 ENCOUNTER — Telehealth (INDEPENDENT_AMBULATORY_CARE_PROVIDER_SITE_OTHER): Payer: Medicaid Other | Admitting: Psychiatry

## 2023-07-15 ENCOUNTER — Encounter (HOSPITAL_COMMUNITY): Payer: Self-pay | Admitting: Psychiatry

## 2023-07-15 DIAGNOSIS — F411 Generalized anxiety disorder: Secondary | ICD-10-CM

## 2023-07-15 DIAGNOSIS — Z0000 Encounter for general adult medical examination without abnormal findings: Secondary | ICD-10-CM

## 2023-07-15 DIAGNOSIS — F53 Postpartum depression: Secondary | ICD-10-CM

## 2023-07-15 MED ORDER — HYDROXYZINE HCL 25 MG PO TABS: 25.0000 mg | ORAL_TABLET | Freq: Three times a day (TID) | ORAL | 3 refills | Status: DC

## 2023-07-15 MED ORDER — CITALOPRAM HYDROBROMIDE 20 MG PO TABS: 20.0000 mg | ORAL_TABLET | Freq: Every day | ORAL | 3 refills | Status: DC

## 2023-07-15 MED ORDER — PRAZOSIN HCL 1 MG PO CAPS: 1.0000 mg | ORAL_CAPSULE | Freq: Every day | ORAL | 3 refills | Status: DC

## 2023-07-15 NOTE — Progress Notes (Signed)
BH MD/PA/NP OP Progress Note Virtual Visit via Video Note  I connected with Kristen Frank on 07/15/23 at 11:30 AM EDT by a video enabled telemedicine application and verified that I am speaking with the correct person using two identifiers.  Location: Patient: Home Provider: Clinic   I discussed the limitations of evaluation and management by telemedicine and the availability of in person appointments. The patient expressed understanding and agreed to proceed.  I provided 30 minutes of non-face-to-face time during this encounter.    07/15/2023 12:10 PM Kristen Frank  MRN:  [id number]  Chief Complaint: "I feel that I maybe gaining weight and I am hungry"  HPI:  37 year old female seen today for follow-up psychiatric evaluation.  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  She is currently managed on hydroxyzine 25 mg 3 times daily, prazosin 1 mg nightly, and Celexa 20  mg daily.  She notes that her medications are effective in managing her psychiatric conditions.    Today provider utilized a Spanish interpreter as patient is Spanish-speaking.  Patient logged in virtually for half of her visit but her audio would not stay connected. During exam she was pleasant, cooperative, engaged in conversation, and maintained eye contact. She informed Clinical research associate that she feel that she has gained weight because she is always hungry. She notes that she has gained 3 pound. She denies diet and exersise. Provider informed patient that diet and exercise maybe beneficial.  Patient notes that she finds her medications effective and states that they are helping. She however notes that she has been having insomnia. She notes that she goes to sleep late and feels tired at night. She reports that she sleeps 5-6 hours nightly. Since her last visit she  notes that her anxiety and depression has been increased. Today provider conducted a GAD 7 and patient scored a 21, at her last visit she scored  a 10.  Provider also conducted PHQ-9 and patient scored a 24, at her last visit she scored a 15. She notes that the increase heat, her 72 year old son who has autism behavior, and family stressors are exacerbating her mental health. She notes that this is temporary as when her children goes back to school and the season is cooler things are better. Today she denies SI/HI/VAH, mania, or paranoia.   Patient notes that she would like to be assessed for autism. She notes that her 72 year olds son who has autism behavior often mimics her behavior. She reports that she has poor social interaction, difficulty socializing with others, ruminating thoughts, and repetitive behaviors.  Provider agreeable to [facility].  Today provider discussed patients lab values with her. Patients cholesterol and HgbA1c slightly elevated. Provider recommended following up with her PCP. She notes that she does not have a PCP. Provider [facility]. Patient notes that she is able to cope with her decreased sleep as she doe not want other medications. No medication changes made today.  Patient to continue medications as prescribed.   No other concerns noted at this time.    Visit Diagnosis:    ICD-10-CM   1. Well adult exam  Z00.00        Past Psychiatric History: Anxiety, depression , PTSD  Past Medical History:  Past Medical History:  Diagnosis Date   GERD (gastroesophageal reflux disease)    History of depression    HSV-2 infection  Idiopathic urticaria 07/26/2021   Preeclampsia 04/20/2012    Past Surgical History:  Procedure Laterality Date   No past surgery      Family Psychiatric History: Denies  Family History:  Family History  Problem Relation Age of Onset   Kidney disease Maternal Uncle    Cervical cancer Maternal Grandmother    Cancer Other    Hypertension Other    Diabetes Other    Heart disease Neg Hx      Social History:  Social History   Socioeconomic History   Marital status: Single    Spouse name: Not on file   Number of children: 3   Years of education: Not on file   Highest education level: Not on file  Occupational History   Not on file  Tobacco Use   Smoking status: Never   Smokeless tobacco: Never  Vaping Use   Vaping status: Never Used  Substance and Sexual Activity   Alcohol use: No   Drug use: No   Sexual activity: Not Currently    Birth control/protection: None  Other Topics Concern   Not on file  Social History Narrative   ** Merged History Encounter **       Social Determinants of Health   Financial Resource Strain: Low Risk  (09/17/2022)   Overall Financial Resource Strain (CARDIA)    Difficulty of Paying Living Expenses: Not hard at all  Food Insecurity: No Food Insecurity (04/05/2021)   Hunger Vital Sign    Worried About Running Out of Food in the Last Year: Never true    Ran Out of Food in the Last Year: Never true  Transportation Needs: No Transportation Needs (09/17/2022)   PRAPARE - Administrator, Civil Service (Medical): No    Lack of Transportation (Non-Medical): No  Physical Activity: Inactive (09/17/2022)   Exercise Vital Sign    Days of Exercise per Week: 0 days    Minutes of Exercise per Session: 0 min  Stress: Stress Concern Present (09/17/2022)   [employer] of Occupational Health - Occupational Stress Questionnaire    Feeling of Stress : Very much  Social Connections: Socially Isolated (09/17/2022)   Social Connection and Isolation Panel [NHANES]    Frequency of Communication with Friends and Family: Never    Frequency of Social Gatherings with Friends and Family: Never    Attends Religious Services: Never    Database administrator or Organizations: No    Attends Banker Meetings: Never    Marital Status: Never married    Allergies:  Allergies  Allergen Reactions   Gold Sodium Thiosulfate    Nickel     Other     Seafood, Lidocaine, Gold, Red meat,lentils,eggs, pecans    Metabolic Disorder Labs: Lab Results  Component Value Date   HGBA1C 6.0 (H) 05/12/2023   No results found for: "PROLACTIN" Lab Results  Component Value Date   CHOL 202 (H) 05/12/2023   TRIG 91 05/12/2023   HDL 44 05/12/2023   CHOLHDL 4.6 (H) 05/12/2023   LDLCALC 141 (H) 05/12/2023   Lab Results  Component Value Date   TSH 3.680 05/12/2023   TSH 2.658 01/20/2019    Therapeutic Level Labs: No results found for: "LITHIUM" No results found for: "VALPROATE" No results found for: "CBMZ"  Current Medications: Current Outpatient Medications  Medication Sig Dispense Refill   acetaminophen (TYLENOL) 325 MG tablet Take 2 tablets (650 mg total) by mouth every 4 (four) hours as needed (for  pain scale < 4). 30 tablet 0   citalopram (CELEXA) 20 MG tablet Take 1 tablet (20 mg total) by mouth daily. 90 tablet 3   cyclobenzaprine (FLEXERIL) 10 MG tablet Take 1 tablet (10 mg total) by mouth 3 (three) times daily as needed for muscle spasms. 30 tablet 2   EPINEPHrine (EPIPEN 2-PAK) 0.3 mg/0.3 mL IJ SOAJ injection Inject 0.3 mg into the muscle once as needed for up to 1 dose (for severe allergic reaction). CAll 911 immediately if you have to use this medicine 1 each 1   hydrOXYzine (ATARAX) 25 MG tablet Take 1 tablet (25 mg total) by mouth 3 (three) times daily. 270 tablet 3   ibuprofen (ADVIL) 600 MG tablet Take 1 tablet (600 mg total) by mouth every 6 (six) hours. 30 tablet 0   omeprazole (PRILOSEC) 20 MG capsule Take 20 mg by mouth daily.     polyethylene glycol (MIRALAX) 17 g packet Take 17 g by mouth daily as needed for mild constipation or moderate constipation. 14 each 2   prazosin (MINIPRESS) 1 MG capsule Take 1 capsule (1 mg total) by mouth at bedtime. 30 capsule 3   predniSONE (DELTASONE) 50 MG tablet One tablet a day 5 tablet 9   Prenatal Vit-Fe Fumarate-FA (PRENATAL MULTIVITAMIN) TABS tablet Take 1 tablet by  mouth daily at 12 noon.     senna-docusate (SENOKOT-S) 8.6-50 MG tablet Take 2 tablets by mouth daily. 30 tablet 2   valACYclovir (VALTREX) 500 MG tablet Take 500 mg by mouth daily.     No current facility-administered medications for this visit.     Musculoskeletal: Strength & Muscle Tone: within normal limits and telehealth visit Gait & Station: normal, telehealth visit Patient leans: N/A  Psychiatric Specialty Exam: Review of Systems  unknown if currently breastfeeding.There is no height or weight on file to calculate BMI.  General Appearance: Well Groomed  Eye Contact:  Good   Speech:  Clear and Coherent and Normal Rate  Volume:  Normal  Mood:  Anxious and Depressed  Affect:  Appropriate and Congruent  Thought Process:  Coherent, Goal Directed, and Linear  Orientation:  Full (Time, Place, and Person)  Thought Content: WDL and Logical   Suicidal Thoughts:  No  Homicidal Thoughts:  No  Memory:  Immediate;   Good Recent;   Good Remote;   Good  Judgement:  Good  Insight:  Good  Psychomotor Activity:  Normal  Concentration:  Concentration: Good and Attention Span: Good  Recall:  Good  Fund of Knowledge: Good  Language: Good  Akathisia:  No  Handed:  Right  AIMS (if indicated): not done  Assets:  Communication Skills Desire for Improvement Financial Resources/Insurance Housing Intimacy Physical Health Social Support  ADL's:  Intact  Cognition: WNL  Sleep:  Good   Screenings: AIMS    Flowsheet Row [facility]  AIMS Total Score 0      AUDIT    Flowsheet Row [facility]  Alcohol Use Disorder Identification Test Final Score (AUDIT) 0      GAD-7    Flowsheet Row Video Visit from 05/07/2023 in [facility] Video Visit from 02/19/2023 in [facility] Video  Visit from 11/20/2022 in [facility] Counselor from 09/17/2022 in [facility] Office Visit from 08/27/2022 in [facility]  Total  GAD-7 Score 10 19 7 21 20       PHQ2-9    Flowsheet Row Video Visit from 05/07/2023 in [facility] Video Visit from 02/19/2023 in [facility] Video Visit from 11/20/2022 in [facility] Counselor from 09/17/2022 in [facility] Office Visit from 08/27/2022 in [facility]  PHQ-2 Total Score 3 5 1 6 6   PHQ-9 Total Score 15 15 3 27 25       Flowsheet Row ED from 05/27/2023 in [facility] Video Visit from 05/07/2023 in [facility] Video Visit from 02/19/2023 in [facility]  C-SSRS RISK CATEGORY No Risk Low Risk Error: Q7 should not be populated when Q6 is No        Assessment and Plan: Patient informed writer that her anxiety and depression has worsened since her last visit.  She however reports that it may be situational due to current life stressors.  Patient notes that she would like to be assessed for autism. She notes that her 88 year olds son who has autism behavior often mimics her behavior. She reports that she has poor social interaction, difficulty socializing with others, ruminating thoughts, and repetitive behaviors.  Provider agreeable to [facility].Today provider discussed patients lab values with her. Patients cholesterol and HgbA1c slightly elevated. Provider recommended following up with her PCP. She notes that she does not have a PCP. Provider [facility]. Patient notes that she is able to cope with her decreased sleep as she doe not want other  medications. No medication changes made today.  Patient to continue medications as prescribe  1. Generalized anxiety disorder  Continue- citalopram (CELEXA) 20 MG tablet; Take 1 tablet (20 mg total) by mouth daily.  Dispense: 90 tablet; Refill: 3 Continue- hydrOXYzine (ATARAX) 25 MG tablet; Take 1 tablet (25 mg total) by mouth 3 (three) times daily.  Dispense: 270 tablet; Refill: 3  2. Post-partum depression  Continue- citalopram (CELEXA) 20 MG tablet; Take 1 tablet (20 mg total) by mouth daily.  Dispense: 90 tablet; Refill: 3 Continue- prazosin (MINIPRESS) 1 MG capsule; Take 1 capsule (1 mg total) by mouth at bedtime.  Dispense: 30 capsule; Refill: 3       Follow-up in 2.5 months   Shanna Cisco, NP 07/15/2023, 12:10 PM

## 2023-07-16 ENCOUNTER — Telehealth (HOSPITAL_COMMUNITY): Payer: Medicaid Other | Admitting: Psychiatry

## 2023-08-08 ENCOUNTER — Other Ambulatory Visit: Payer: Self-pay

## 2023-08-08 ENCOUNTER — Emergency Department (HOSPITAL_COMMUNITY)
Admission: EM | Admit: 2023-08-08 | Discharge: 2023-08-08 | Disposition: A | Discharge status: Home / Self Care | Payer: Medicaid Other | Source: Home / Self Care | Attending: Emergency Medicine | Admitting: Emergency Medicine

## 2023-08-08 ENCOUNTER — Emergency Department (HOSPITAL_COMMUNITY): Payer: Medicaid Other

## 2023-08-08 DIAGNOSIS — Y92410 Unspecified street and highway as the place of occurrence of the external cause: Secondary | ICD-10-CM | POA: Insufficient documentation

## 2023-08-08 DIAGNOSIS — R0789 Other chest pain: Secondary | ICD-10-CM | POA: Insufficient documentation

## 2023-08-08 DIAGNOSIS — S46912A Strain of unspecified muscle, fascia and tendon at shoulder and upper arm level, left arm, initial encounter: Secondary | ICD-10-CM

## 2023-08-08 DIAGNOSIS — S4992XA Unspecified injury of left shoulder and upper arm, initial encounter: Secondary | ICD-10-CM | POA: Diagnosis present

## 2023-08-08 MED ORDER — IBUPROFEN 800 MG PO TABS
800.0000 mg | ORAL_TABLET | Freq: Once | ORAL | Status: AC
  Administered 2023-08-08: 800 mg via ORAL
  Filled 2023-08-08: qty 1

## 2023-08-08 MED ORDER — CYCLOBENZAPRINE HCL 5 MG PO TABS: 5.0000 mg | ORAL_TABLET | Freq: Three times a day (TID) | ORAL | 0 refills | Status: AC | PRN

## 2023-08-08 MED ORDER — IBUPROFEN 800 MG PO TABS: 800.0000 mg | ORAL_TABLET | Freq: Three times a day (TID) | ORAL | 0 refills | Status: AC
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
## 2023-08-08 NOTE — ED Notes (Signed)
Dc instructions and scripts reviewed with pt no questions or concerns at this time. Will  follow up if needed.

## 2023-08-08 NOTE — Discharge Instructions (Addendum)
As we discussed your x-ray did not show any fracture  Please take ibuprofen for pain and Flexeril for muscle spasms  See your doctor for follow-up  Return to ER if you have worse chest pain or shoulder pain
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
## 2023-08-21 ENCOUNTER — Inpatient Hospital Stay: Payer: Medicaid Other | Admitting: Physician Assistant

## 2023-09-03 ENCOUNTER — Inpatient Hospital Stay: Payer: Medicaid Other | Admitting: Physician Assistant

## 2023-09-04 ENCOUNTER — Inpatient Hospital Stay: Payer: Medicaid Other | Admitting: Physician Assistant

## 2023-10-15 ENCOUNTER — Encounter (HOSPITAL_COMMUNITY): Payer: Self-pay | Admitting: Psychiatry

## 2023-10-15 ENCOUNTER — Telehealth (INDEPENDENT_AMBULATORY_CARE_PROVIDER_SITE_OTHER): Payer: Medicaid Other | Admitting: Psychiatry

## 2023-10-15 DIAGNOSIS — F53 Postpartum depression: Secondary | ICD-10-CM

## 2023-10-15 DIAGNOSIS — F411 Generalized anxiety disorder: Secondary | ICD-10-CM | POA: Diagnosis not present

## 2023-10-15 MED ORDER — HYDROXYZINE HCL 25 MG PO TABS: 25.0000 mg | ORAL_TABLET | Freq: Three times a day (TID) | ORAL | 3 refills | Status: DC

## 2023-10-15 MED ORDER — CITALOPRAM HYDROBROMIDE 20 MG PO TABS: 20.0000 mg | ORAL_TABLET | Freq: Every day | ORAL | 3 refills | Status: DC

## 2023-10-15 MED ORDER — PRAZOSIN HCL 1 MG PO CAPS: 1.0000 mg | ORAL_CAPSULE | Freq: Every day | ORAL | 3 refills | Status: DC

## 2023-10-15 NOTE — Progress Notes (Signed)
BH MD/PA/NP OP Progress Note Virtual Visit via Video Note  I connected with Kristen Frank on 10/15/23 at  1:00 PM EDT by a video enabled telemedicine application and verified that I am speaking with the correct person using two identifiers.  Location: Patient: Home Provider: Clinic   I discussed the limitations of evaluation and management by telemedicine and the availability of in person appointments. The patient expressed understanding and agreed to proceed.  I provided 30 minutes of non-face-to-face time during this encounter.    10/15/2023 12:35 PM Kristen Frank  MRN:  [id number]  Chief Complaint: "I am good  HPI:  37 year old female seen today for follow-up psychiatric evaluation.  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  She is currently managed on hydroxyzine 25 mg 3 times daily, prazosin 1 mg nightly, and Celexa 20  mg daily.  She notes that her medications are effective in managing her psychiatric conditions.    Today provider utilized a Spanish interpreter as patient is Spanish-speaking.  During exam she was pleasant, cooperative, engaged in conversation, and maintained eye contact. She informed Clinical research associate that she has been doing good. She notes that she missed her appointment with her PCP to discuss her elevated Hgb A1c and cholesterol. She however notes that she will reschedule.    Since her last visit she notes that her anxiety and depression have been well managed. She notes that she feels calm. Today provider conducted a GAD 7 and she scored a 12, at her last visit she scored a 21.  Provider also conducted PHQ-9 and patient scored a 14, at her last visit she scored a 24. Today she denies SI/HI/VAH, mania, or paranoia.   Today no medication changes made today. Patient agreeable to continue medications as prescribed. No other concerns noted at this time.     Visit Diagnosis:    ICD-10-CM   1. Generalized anxiety disorder  F41.1 citalopram  (CELEXA) 20 MG tablet    hydrOXYzine (ATARAX) 25 MG tablet    2. Post-partum depression  F53.0 citalopram (CELEXA) 20 MG tablet    prazosin (MINIPRESS) 1 MG capsule        Past Psychiatric History: Anxiety, depression , PTSD  Past Medical History:  Past Medical History:  Diagnosis Date   GERD (gastroesophageal reflux disease)    History of depression    HSV-2 infection    Idiopathic urticaria 07/26/2021   Preeclampsia 04/20/2012    Past Surgical History:  Procedure Laterality Date   No past surgery      Family Psychiatric History: Denies  Family History:  Family History  Problem Relation Age of Onset   Kidney disease Maternal Uncle    Cervical cancer Maternal Grandmother    Cancer Other    Hypertension Other    Diabetes Other    Heart disease Neg Hx     Social History:  Social History   Socioeconomic History   Marital status: Single    Spouse name: Not on file   Number of children: 3   Years of education: Not on file   Highest education level: Not on file  Occupational History   Not on file  Tobacco Use   Smoking status: Never   Smokeless tobacco: Never  Vaping Use   Vaping status: Never Used  Substance and Sexual Activity   Alcohol use: No   Drug use: No   Sexual activity: Not Currently    Birth control/protection: None  Other Topics Concern  Not on file  Social History Narrative   ** Merged History Encounter **       Social Determinants of Health   Financial Resource Strain: Low Risk  (09/17/2022)   Overall Financial Resource Strain (CARDIA)    Difficulty of Paying Living Expenses: Not hard at all  Food Insecurity: No Food Insecurity (04/05/2021)   Hunger Vital Sign    Worried About Running Out of Food in the Last Year: Never true    Ran Out of Food in the Last Year: Never true  Transportation Needs: No Transportation Needs (09/17/2022)   PRAPARE - Administrator, Civil Service (Medical): No    Lack of Transportation (Non-Medical):  No  Physical Activity: Inactive (09/17/2022)   Exercise Vital Sign    Days of Exercise per Week: 0 days    Minutes of Exercise per Session: 0 min  Stress: Stress Concern Present (09/17/2022)   [employer] of Occupational Health - Occupational Stress Questionnaire    Feeling of Stress : Very much  Social Connections: Socially Isolated (09/17/2022)   Social Connection and Isolation Panel [NHANES]    Frequency of Communication with Friends and Family: Never    Frequency of Social Gatherings with Friends and Family: Never    Attends Religious Services: Never    Database administrator or Organizations: No    Attends Banker Meetings: Never    Marital Status: Never married    Allergies:  Allergies  Allergen Reactions   Gold Sodium Thiosulfate    Nickel    Other     Seafood, Lidocaine, Gold, Red meat,lentils,eggs, pecans    Metabolic Disorder Labs: Lab Results  Component Value Date   HGBA1C 6.0 (H) 05/12/2023   No results found for: "PROLACTIN" Lab Results  Component Value Date   CHOL 202 (H) 05/12/2023   TRIG 91 05/12/2023   HDL 44 05/12/2023   CHOLHDL 4.6 (H) 05/12/2023   LDLCALC 141 (H) 05/12/2023   Lab Results  Component Value Date   TSH 3.680 05/12/2023   TSH 2.658 01/20/2019    Therapeutic Level Labs: No results found for: "LITHIUM" No results found for: "VALPROATE" No results found for: "CBMZ"  Current Medications: Current Outpatient Medications  Medication Sig Dispense Refill   acetaminophen (TYLENOL) 325 MG tablet Take 2 tablets (650 mg total) by mouth every 4 (four) hours as needed (for pain scale < 4). 30 tablet 0   citalopram (CELEXA) 20 MG tablet Take 1 tablet (20 mg total) by mouth daily. 90 tablet 3   cyclobenzaprine (FLEXERIL) 5 MG tablet Take 1 tablet (5 mg total) by mouth 3 (three) times daily as needed. 10 tablet 0   EPINEPHrine (EPIPEN 2-PAK) 0.3 mg/0.3 mL IJ SOAJ injection Inject 0.3 mg into the muscle once as needed for up to 1  dose (for severe allergic reaction). CAll 911 immediately if you have to use this medicine 1 each 1   hydrOXYzine (ATARAX) 25 MG tablet Take 1 tablet (25 mg total) by mouth 3 (three) times daily. 270 tablet 3   ibuprofen (ADVIL) 800 MG tablet Take 1 tablet (800 mg total) by mouth 3 (three) times daily. 21 tablet 0   omeprazole (PRILOSEC) 20 MG capsule Take 20 mg by mouth daily.     polyethylene glycol (MIRALAX) 17 g packet Take 17 g by mouth daily as needed for mild constipation or moderate constipation. 14 each 2   prazosin (MINIPRESS) 1 MG capsule Take 1 capsule (1 mg total)  by mouth at bedtime. 30 capsule 3   predniSONE (DELTASONE) 50 MG tablet One tablet a day 5 tablet 9   Prenatal Vit-Fe Fumarate-FA (PRENATAL MULTIVITAMIN) TABS tablet Take 1 tablet by mouth daily at 12 noon.     senna-docusate (SENOKOT-S) 8.6-50 MG tablet Take 2 tablets by mouth daily. 30 tablet 2   valACYclovir (VALTREX) 500 MG tablet Take 500 mg by mouth daily.     No current facility-administered medications for this visit.     Musculoskeletal: Strength & Muscle Tone: within normal limits and telehealth visit Gait & Station: normal, telehealth visit Patient leans: N/A  Psychiatric Specialty Exam: Review of Systems  unknown if currently breastfeeding.There is no height or weight on file to calculate BMI.  General Appearance: Well Groomed  Eye Contact:  Good   Speech:  Clear and Coherent and Normal Rate  Volume:  Normal  Mood:  Euthymic  Affect:  Appropriate and Congruent  Thought Process:  Coherent, Goal Directed, and Linear  Orientation:  Full (Time, Place, and Person)  Thought Content: WDL and Logical   Suicidal Thoughts:  No  Homicidal Thoughts:  No  Memory:  Immediate;   Good Recent;   Good Remote;   Good  Judgement:  Good  Insight:  Good  Psychomotor Activity:  Normal  Concentration:  Concentration: Good and Attention Span: Good  Recall:  Good  Fund of Knowledge: Good  Language: Good   Akathisia:  No  Handed:  Right  AIMS (if indicated): not done  Assets:  Communication Skills Desire for Improvement Financial Resources/Insurance Housing Intimacy Physical Health Social Support  ADL's:  Intact  Cognition: WNL  Sleep:  Good   Screenings: AIMS    Flowsheet Row [facility]  AIMS Total Score 0      AUDIT    Flowsheet Row [facility]  Alcohol Use Disorder Identification Test Final Score (AUDIT) 0      GAD-7    Flowsheet Row Video Visit from 10/15/2023 in [facility] Video Visit from 07/15/2023 in [facility] Video Visit from 05/07/2023 in [facility] Video Visit from 02/19/2023 in [facility] Video Visit from 11/20/2022 in [facility]  Total GAD-7 Score 12 21 10 19 7       PHQ2-9    Flowsheet Row Video Visit from 10/15/2023 in [facility] Video Visit from 07/15/2023 in [facility] Video Visit from 05/07/2023 in [facility] Video Visit from 02/19/2023 in [facility] Video Visit from 11/20/2022 in [facility]  PHQ-2 Total Score 2 6 3 5 1   PHQ-9 Total Score 14 24 15 15 3       Flowsheet Row ED from 08/08/2023 in [facility] at [facility] Video Visit from 07/15/2023 in [facility] ED from 05/27/2023 in [facility] Emergency Department at [facility]  C-SSRS RISK CATEGORY No Risk Low Risk No Risk        Assessment and Plan: Patient informed writer that her anxiety and depression has improved since her last visit. Today no medication changes made today. Patient agreeable to  continue medications as prescribed.   1. Generalized anxiety disorder  Continue- citalopram (CELEXA) 20 MG tablet; Take 1 tablet (20 mg total)  by mouth daily.  Dispense: 90 tablet; Refill: 3 Continue- hydrOXYzine (ATARAX) 25 MG tablet; Take 1 tablet (25 mg total) by mouth 3 (three) times daily.  Dispense: 270 tablet; Refill: 3  2. Post-partum depression  Continue- citalopram (CELEXA) 20 MG tablet; Take 1 tablet (20 mg total) by mouth daily.  Dispense: 90 tablet; Refill: 3 Continue- prazosin (MINIPRESS) 1 MG capsule; Take 1 capsule (1 mg total) by mouth at bedtime.  Dispense: 30 capsule; Refill: 3       Follow-up in 2.5 months   Shanna Cisco, NP 10/15/2023, 12:35 PM

## 2023-12-05 ENCOUNTER — Ambulatory Visit: Payer: Medicaid Other | Admitting: Nurse Practitioner

## 2024-01-06 ENCOUNTER — Telehealth (HOSPITAL_COMMUNITY): Payer: Medicaid Other | Admitting: Psychiatry

## 2024-01-06 ENCOUNTER — Encounter (HOSPITAL_COMMUNITY): Payer: Self-pay | Admitting: Psychiatry

## 2024-01-06 ENCOUNTER — Encounter: Payer: Self-pay | Admitting: Nurse Practitioner

## 2024-01-06 DIAGNOSIS — F411 Generalized anxiety disorder: Secondary | ICD-10-CM | POA: Diagnosis not present

## 2024-01-06 DIAGNOSIS — F53 Postpartum depression: Secondary | ICD-10-CM | POA: Diagnosis not present

## 2024-01-06 MED ORDER — PRAZOSIN HCL 1 MG PO CAPS: 1.0000 mg | ORAL_CAPSULE | Freq: Every day | ORAL | 3 refills | Status: DC

## 2024-01-06 MED ORDER — HYDROXYZINE HCL 25 MG PO TABS: 25.0000 mg | ORAL_TABLET | Freq: Four times a day (QID) | ORAL | 3 refills | Status: DC | PRN

## 2024-01-06 MED ORDER — TRAZODONE HCL 50 MG PO TABS: 50.0000 mg | ORAL_TABLET | Freq: Every evening | ORAL | 3 refills | Status: DC | PRN

## 2024-01-06 MED ORDER — CITALOPRAM HYDROBROMIDE 20 MG PO TABS: 20.0000 mg | ORAL_TABLET | Freq: Every day | ORAL | 3 refills | Status: DC

## 2024-01-06 NOTE — Progress Notes (Signed)
 BH MD/PA/NP OP Progress Note Virtual Visit via Video Note  I connected with Kristen Frank on 01/06/24 at  2:00 PM EST by a video enabled telemedicine application and verified that I am speaking with the correct person using two identifiers.  Location: Patient: Home Provider: Clinic   I discussed the limitations of evaluation and management by telemedicine and the availability of in person appointments. The patient expressed understanding and agreed to proceed.  I provided 30 minutes of non-face-to-face time during this encounter.    01/06/2024 2:52 PM Kristen Frank  MRN:  [id number]  Chief Complaint: Can I get medications to help with intoxication  HPI:  38 year old female seen today for follow-up psychiatric evaluation.  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  She is currently managed on hydroxyzine  25 mg 3 times daily, prazosin  1 mg nightly, and Celexa  20  mg daily.  She notes that her medications are somewhat effective in managing her psychiatric conditions.    Today provider utilized a Spanish interpreter as patient is Spanish-speaking.  During exam she was pleasant, cooperative, engaged in conversation, and maintained eye contact. She asked provider if she could have medications to help with intoxication. Provider asked patient if she became intoxicated from alcohol or illegal substances. Patient notes that she ate food recently and became intoxicated. Provider asked patient if she had an allergic reaction and she notes that she did in the past. She was reports that she was prescribed Epinephrine  injections. Provider informed patient to discuss this with her PCP. She endorsed understanding and notes that she has an upcoming visit.    Recently patient notes that she has been more anxious about her son. She informed clinical research associate that he may have autism and notes that he will begin therapy soon. She too believes she has autism and reports that she did not receive  a call from [employer]. Provider gave patient the number to follow up with the. Since her last visit she notes that her anxiety and depression have been well managed. She notes that she feels calm. Today provider conducted a GAD 7 and she scored a 15, at her last visit she scored a 12.  Provider also conducted PHQ-9 and patient scored a 13, at her last visit she scored a 14. Today she denies SI/HI/VAH, mania, or paranoia. She notes that she sleeps 5-6 hours nightly and reports that she has an increased appetite.  Today patient agreeable to starting Trazodone  50 mg nightly as needed to help manage sleep. She will continue all other medications as prescribed. Potential side effects of medication and risks vs benefits of treatment vs non-treatment were explained and discussed. All questions were answered. No other concerns noted at this time.     Visit Diagnosis:    ICD-10-CM   1. Generalized anxiety disorder  F41.1 traZODone  (DESYREL ) 50 MG tablet    citalopram  (CELEXA ) 20 MG tablet    hydrOXYzine  (ATARAX ) 25 MG tablet    2. Post-partum depression  F53.0 traZODone  (DESYREL ) 50 MG tablet    citalopram  (CELEXA ) 20 MG tablet    prazosin  (MINIPRESS ) 1 MG capsule         Past Psychiatric History: Anxiety, depression , PTSD  Past Medical History:  Past Medical History:  Diagnosis Date   GERD (gastroesophageal reflux disease)    History of depression    HSV-2 infection    Idiopathic urticaria 07/26/2021   Preeclampsia 04/20/2012    Past Surgical History:  Procedure Laterality Date  No past surgery      Family Psychiatric History: Denies  Family History:  Family History  Problem Relation Age of Onset   Kidney disease Maternal Uncle    Cervical cancer Maternal Grandmother    Cancer Other    Hypertension Other    Diabetes Other    Heart disease Neg Hx     Social History:  Social History   Socioeconomic History   Marital status: Single    Spouse name: Not on file   Number of  children: 3   Years of education: Not on file   Highest education level: Not on file  Occupational History   Not on file  Tobacco Use   Smoking status: Never   Smokeless tobacco: Never  Vaping Use   Vaping status: Never Used  Substance and Sexual Activity   Alcohol use: No   Drug use: No   Sexual activity: Not Currently    Birth control/protection: None  Other Topics Concern   Not on file  Social History Narrative   ** Merged History Encounter **       Social Drivers of Health   Financial Resource Strain: Low Risk  (09/17/2022)   Overall Financial Resource Strain (CARDIA)    Difficulty of Paying Living Expenses: Not hard at all  Food Insecurity: No Food Insecurity (04/05/2021)   Hunger Vital Sign    Worried About Running Out of Food in the Last Year: Never true    Ran Out of Food in the Last Year: Never true  Transportation Needs: No Transportation Needs (09/17/2022)   PRAPARE - Administrator, Civil Service (Medical): No    Lack of Transportation (Non-Medical): No  Physical Activity: Inactive (09/17/2022)   Exercise Vital Sign    Days of Exercise per Week: 0 days    Minutes of Exercise per Session: 0 min  Stress: Stress Concern Present (09/17/2022)   [employer] of Occupational Health - Occupational Stress Questionnaire    Feeling of Stress : Very much  Social Connections: Socially Isolated (09/17/2022)   Social Connection and Isolation Panel [NHANES]    Frequency of Communication with Friends and Family: Never    Frequency of Social Gatherings with Friends and Family: Never    Attends Religious Services: Never    Database Administrator or Organizations: No    Attends Banker Meetings: Never    Marital Status: Never married    Allergies:  Allergies  Allergen Reactions   Gold Sodium Thiosulfate    Nickel    Other     Seafood, Lidocaine , Gold, Red meat,lentils,eggs, pecans    Metabolic Disorder Labs: Lab Results  Component Value Date    HGBA1C 6.0 (H) 05/12/2023   No results found for: PROLACTIN Lab Results  Component Value Date   CHOL 202 (H) 05/12/2023   TRIG 91 05/12/2023   HDL 44 05/12/2023   CHOLHDL 4.6 (H) 05/12/2023   LDLCALC 141 (H) 05/12/2023   Lab Results  Component Value Date   TSH 3.680 05/12/2023   TSH 2.658 01/20/2019    Therapeutic Level Labs: No results found for: LITHIUM No results found for: VALPROATE No results found for: CBMZ  Current Medications: Current Outpatient Medications  Medication Sig Dispense Refill   traZODone  (DESYREL ) 50 MG tablet Take 1 tablet (50 mg total) by mouth at bedtime as needed for sleep. 30 tablet 3   acetaminophen  (TYLENOL ) 325 MG tablet Take 2 tablets (650 mg total) by mouth every 4 (  four) hours as needed (for pain scale < 4). 30 tablet 0   citalopram  (CELEXA ) 20 MG tablet Take 1 tablet (20 mg total) by mouth daily. 90 tablet 3   cyclobenzaprine  (FLEXERIL ) 5 MG tablet Take 1 tablet (5 mg total) by mouth 3 (three) times daily as needed. 10 tablet 0   EPINEPHrine  (EPIPEN  2-PAK) 0.3 mg/0.3 mL IJ SOAJ injection Inject 0.3 mg into the muscle once as needed for up to 1 dose (for severe allergic reaction). CAll 911 immediately if you have to use this medicine 1 each 1   hydrOXYzine  (ATARAX ) 25 MG tablet Take 1 tablet (25 mg total) by mouth 4 (four) times daily as needed. 120 tablet 3   ibuprofen  (ADVIL ) 800 MG tablet Take 1 tablet (800 mg total) by mouth 3 (three) times daily. 21 tablet 0   omeprazole  (PRILOSEC) 20 MG capsule Take 20 mg by mouth daily.     polyethylene glycol (MIRALAX ) 17 g packet Take 17 g by mouth daily as needed for mild constipation or moderate constipation. 14 each 2   prazosin  (MINIPRESS ) 1 MG capsule Take 1 capsule (1 mg total) by mouth at bedtime. 30 capsule 3   predniSONE  (DELTASONE ) 50 MG tablet One tablet a day 5 tablet 9   Prenatal Vit-Fe Fumarate-FA (PRENATAL MULTIVITAMIN) TABS tablet Take 1 tablet by mouth daily at 12 noon.      senna-docusate (SENOKOT-S) 8.6-50 MG tablet Take 2 tablets by mouth daily. 30 tablet 2   valACYclovir  (VALTREX ) 500 MG tablet Take 500 mg by mouth daily.     No current facility-administered medications for this visit.     Musculoskeletal: Strength & Muscle Tone: within normal limits and telehealth visit Gait & Station: normal, telehealth visit Patient leans: N/A  Psychiatric Specialty Exam: Review of Systems  unknown if currently breastfeeding.There is no height or weight on file to calculate BMI.  General Appearance: Well Groomed  Eye Contact:  Good   Speech:  Clear and Coherent and Normal Rate  Volume:  Normal  Mood:  Anxious and Depressed  Affect:  Appropriate and Congruent  Thought Process:  Coherent, Goal Directed, and Linear  Orientation:  Full (Time, Place, and Person)  Thought Content: WDL and Logical   Suicidal Thoughts:  No  Homicidal Thoughts:  No  Memory:  Immediate;   Good Recent;   Good Remote;   Good  Judgement:  Good  Insight:  Good  Psychomotor Activity:  Normal  Concentration:  Concentration: Good and Attention Span: Good  Recall:  Good  Fund of Knowledge: Good  Language: Good  Akathisia:  No  Handed:  Right  AIMS (if indicated): not done  Assets:  Communication Skills Desire for Improvement Financial Resources/Insurance Housing Intimacy Physical Health Social Support  ADL's:  Intact  Cognition: WNL  Sleep:  Fair   Screenings: AIMS    Flowsheet Row [facility]  AIMS Total Score 0      AUDIT    Flowsheet Row Ad[facility]  Alcohol Use Disorder Identification Test Final Score (AUDIT) 0      GAD-7    Flowsheet Row [facility] from 01/06/2024 in [facility] Video Visit from 10/15/2023 in [facility] Video Visit from 07/15/2023 in  [facility] Video Visit from 05/07/2023 in [facility] Video Visit from 02/19/2023 in [facility]  Total GAD-7 Score 15 12 21 10 19       PHQ2-9    Flowsheet Row Video Visit from 01/06/2024 in [facility] Video Visit from 10/15/2023 in [facility] Video Visit from 07/15/2023 in [facility] Video Visit from 05/07/2023 in [facility] Video Visit from 02/19/2023 in [facility]  PHQ-2 Total Score 4 2 6 3 5   PHQ-9 Total Score 13 14 24 15 15       Flowsheet Row ED from 08/08/2023 in [facility] Emergency Department at [facility] Video Visit from 07/15/2023 in [facility] ED from 05/27/2023 in [facility] Emergency Department at [facility]  C-SSRS RISK CATEGORY No Risk Low Risk No Risk        Assessment and Plan: Patient informed writer that her anxiety and depression continues to be problematic. She also notes that her sleep is poor. Today patient agreeable to starting Trazodone  50 mg nightly as needed to help manage sleep. She will continue all other medications as prescribed.   1. Generalized anxiety disorder  Start- traZODone  (DESYREL ) 50 MG tablet; Take 1 tablet (50 mg total) by mouth at bedtime as needed for sleep.  Dispense: 30 tablet; Refill: 3 Continue- citalopram  (CELEXA ) 20 MG tablet; Take 1 tablet (20 mg total) by mouth daily.  Dispense: 90 tablet; Refill: 3 Continue- hydrOXYzine  (ATARAX ) 25 MG tablet; Take 1 tablet (25 mg total) by mouth 4 (four) times daily as needed.  Dispense: 120 tablet; Refill: 3  2. Post-partum depression  Continue- traZODone  (DESYREL ) 50 MG tablet; Take 1 tablet (50 mg total) by mouth at bedtime as needed for sleep.  Dispense: 30 tablet; Refill: 3 Continue- citalopram  (CELEXA ) 20 MG tablet; Take 1  tablet (20 mg total) by mouth daily.  Dispense: 90 tablet; Refill: 3 Continue- prazosin  (MINIPRESS ) 1 MG capsule; Take 1 capsule (1 mg total) by mouth at bedtime.  Dispense: 30 capsule; Refill: 3     Follow-up in 2.5 months   Zane FORBES Bach, NP 01/06/2024, 2:52 PM

## 2024-01-27 ENCOUNTER — Ambulatory Visit: Payer: Medicaid Other | Attending: Nurse Practitioner | Admitting: Nurse Practitioner

## 2024-01-27 ENCOUNTER — Encounter: Payer: Self-pay | Admitting: Nurse Practitioner

## 2024-01-27 VITALS — BP 110/75 | HR 72 | Resp 19 | Ht 62.0 in | Wt 176.6 lb

## 2024-01-27 DIAGNOSIS — R7303 Prediabetes: Secondary | ICD-10-CM

## 2024-01-27 DIAGNOSIS — K219 Gastro-esophageal reflux disease without esophagitis: Secondary | ICD-10-CM

## 2024-01-27 DIAGNOSIS — Z91030 Bee allergy status: Secondary | ICD-10-CM

## 2024-01-27 MED ORDER — OMEPRAZOLE 20 MG PO CPDR: 20.0000 mg | DELAYED_RELEASE_CAPSULE | Freq: Every day | ORAL | 1 refills | Status: AC

## 2024-01-27 MED ORDER — EPINEPHRINE 0.3 MG/0.3ML IJ SOAJ: 0.3000 mg | Freq: Once | INTRAMUSCULAR | 1 refills | Status: AC | PRN

## 2024-01-27 NOTE — Progress Notes (Signed)
Assessment & Plan:  Kristen Frank was seen today for medical management of chronic issues.  Diagnoses and all orders for this visit:  GERD without esophagitis -     omeprazole (PRILOSEC) 20 MG capsule; Take 1 capsule (20 mg total) by mouth daily before breakfast. INSTRUCTIONS: Avoid GERD Triggers: acidic, spicy or fried foods, caffeine, coffee, sodas,  alcohol and chocolate.    Prediabetes Follow up next office visit  Allergy to honey bee venom -     EPINEPHrine (EPIPEN 2-PAK) 0.3 mg/0.3 mL IJ SOAJ injection; Inject 0.3 mg into the muscle once as needed for up to 1 dose (for severe allergic reaction). CAll 911 immediately if you have to use this medicine    Patient has been counseled on age-appropriate routine health concerns for screening and prevention. These are reviewed and up-to-date. Referrals have been placed accordingly. Immunizations are up-to-date or declined.    Subjective:   Chief Complaint  Patient presents with   Medical Management of Chronic Issues    Kristen Frank 38 y.o. female presents to office today for follow up to GERD.  VRI was used to communicate directly with patient for the entire encounter including providing detailed patient instructions.    Has been out of omeprazole. Experiencing symptoms of nausea, bloating, acid in stomach and bilious vomiting with certain foods. She saw GI for this a few years ago and was placed on omeprazole which she reports today as effective.      Review of Systems  Constitutional:  Negative for fever, malaise/fatigue and weight loss.  HENT: Negative.  Negative for nosebleeds.   Eyes: Negative.  Negative for blurred vision, double vision and photophobia.  Respiratory: Negative.  Negative for cough and shortness of breath.   Cardiovascular: Negative.  Negative for chest pain, palpitations and leg swelling.  Gastrointestinal:  Positive for heartburn. Negative for nausea and vomiting.  Musculoskeletal: Negative.  Negative for  myalgias.  Neurological: Negative.  Negative for dizziness, focal weakness, seizures and headaches.  Psychiatric/Behavioral: Negative.  Negative for suicidal ideas.     Past Medical History:  Diagnosis Date   GERD (gastroesophageal reflux disease)    History of depression    HSV-2 infection    Idiopathic urticaria 07/26/2021   Preeclampsia 04/20/2012    Past Surgical History:  Procedure Laterality Date   No past surgery      Family History  Problem Relation Age of Onset   Kidney disease Maternal Uncle    Cervical cancer Maternal Grandmother    Cancer Other    Hypertension Other    Diabetes Other    Heart disease Neg Hx     Social History Reviewed with no changes to be made today.   Outpatient Medications Prior to Visit  Medication Sig Dispense Refill   acetaminophen (TYLENOL) 325 MG tablet Take 2 tablets (650 mg total) by mouth every 4 (four) hours as needed (for pain scale < 4). 30 tablet 0   citalopram (CELEXA) 20 MG tablet Take 1 tablet (20 mg total) by mouth daily. 90 tablet 3   hydrOXYzine (ATARAX) 25 MG tablet Take 1 tablet (25 mg total) by mouth 4 (four) times daily as needed. 120 tablet 3   ibuprofen (ADVIL) 800 MG tablet Take 1 tablet (800 mg total) by mouth 3 (three) times daily. 21 tablet 0   prazosin (MINIPRESS) 1 MG capsule Take 1 capsule (1 mg total) by mouth at bedtime. 30 capsule 3   traZODone (DESYREL) 50 MG tablet Take 1 tablet (  50 mg total) by mouth at bedtime as needed for sleep. 30 tablet 3   EPINEPHrine (EPIPEN 2-PAK) 0.3 mg/0.3 mL IJ SOAJ injection Inject 0.3 mg into the muscle once as needed for up to 1 dose (for severe allergic reaction). CAll 911 immediately if you have to use this medicine 1 each 1   omeprazole (PRILOSEC) 20 MG capsule Take 20 mg by mouth daily.     cyclobenzaprine (FLEXERIL) 5 MG tablet Take 1 tablet (5 mg total) by mouth 3 (three) times daily as needed. 10 tablet 0   valACYclovir (VALTREX) 500 MG tablet Take 500 mg by mouth daily.      polyethylene glycol (MIRALAX) 17 g packet Take 17 g by mouth daily as needed for mild constipation or moderate constipation. (Patient not taking: Reported on 01/27/2024) 14 each 2   predniSONE (DELTASONE) 50 MG tablet One tablet a day 5 tablet 9   Prenatal Vit-Fe Fumarate-FA (PRENATAL MULTIVITAMIN) TABS tablet Take 1 tablet by mouth daily at 12 noon. (Patient not taking: Reported on 01/27/2024)     senna-docusate (SENOKOT-S) 8.6-50 MG tablet Take 2 tablets by mouth daily. (Patient not taking: Reported on 01/27/2024) 30 tablet 2   No facility-administered medications prior to visit.    Allergies  Allergen Reactions   Gold Sodium Thiosulfate    Nickel    Other     Seafood, Lidocaine, Gold, Red meat,lentils,eggs, pecans       Objective:    BP 110/75 (BP Location: Left Arm, Patient Position: Sitting, Cuff Size: Normal)   Pulse 72   Resp 19   Ht 5\' 2"  (1.575 m)   Wt 176 lb 9.6 oz (80.1 kg)   SpO2 100%   Breastfeeding No   BMI 32.30 kg/m  Wt Readings from Last 3 Encounters:  01/27/24 176 lb 9.6 oz (80.1 kg)  08/08/23 178 lb (80.7 kg)  07/17/22 175 lb 14.4 oz (79.8 kg)    Physical Exam Vitals and nursing note reviewed.  Constitutional:      Appearance: She is well-developed.  HENT:     Head: Normocephalic and atraumatic.  Cardiovascular:     Rate and Rhythm: Normal rate and regular rhythm.     Heart sounds: Normal heart sounds. No murmur heard.    No friction rub. No gallop.  Pulmonary:     Effort: Pulmonary effort is normal. No tachypnea or respiratory distress.     Breath sounds: Normal breath sounds. No decreased breath sounds, wheezing, rhonchi or rales.  Chest:     Chest wall: No tenderness.  Abdominal:     General: Bowel sounds are normal.     Palpations: Abdomen is soft.  Musculoskeletal:        General: Normal range of motion.     Cervical back: Normal range of motion.  Skin:    General: Skin is warm and dry.  Neurological:     Mental Status: She is alert  and oriented to person, place, and time.     Coordination: Coordination normal.  Psychiatric:        Behavior: Behavior normal. Behavior is cooperative.        Thought Content: Thought content normal.        Judgment: Judgment normal.          Patient has been counseled extensively about nutrition and exercise as well as the importance of adherence with medications and regular follow-up. The patient was given clear instructions to go to ER or return to medical center  if symptoms don't improve, worsen or new problems develop. The patient verbalized understanding.   Follow-up: Return in about 3 months (around 04/26/2024).   Claiborne Rigg, FNP-BC [facility] [place], [place] [phone]   01/27/2024, 9:13 AM

## 2024-01-29 ENCOUNTER — Other Ambulatory Visit (HOSPITAL_COMMUNITY): Payer: Self-pay | Admitting: Psychiatry

## 2024-01-29 DIAGNOSIS — F411 Generalized anxiety disorder: Secondary | ICD-10-CM

## 2024-01-29 DIAGNOSIS — F53 Postpartum depression: Secondary | ICD-10-CM

## 2024-02-19 NOTE — Progress Notes (Signed)
Call to patient unanswered

## 2024-02-27 ENCOUNTER — Other Ambulatory Visit (HOSPITAL_COMMUNITY): Payer: Self-pay | Admitting: Psychiatry

## 2024-02-27 DIAGNOSIS — F411 Generalized anxiety disorder: Secondary | ICD-10-CM

## 2024-03-10 ENCOUNTER — Telehealth (INDEPENDENT_AMBULATORY_CARE_PROVIDER_SITE_OTHER): Payer: Medicaid Other | Admitting: Psychiatry

## 2024-03-10 ENCOUNTER — Encounter (HOSPITAL_COMMUNITY): Payer: Self-pay | Admitting: Psychiatry

## 2024-03-10 DIAGNOSIS — F53 Postpartum depression: Secondary | ICD-10-CM | POA: Diagnosis not present

## 2024-03-10 DIAGNOSIS — F411 Generalized anxiety disorder: Secondary | ICD-10-CM | POA: Diagnosis not present

## 2024-03-10 MED ORDER — TRAZODONE HCL 50 MG PO TABS
50.0000 mg | ORAL_TABLET | Freq: Every evening | ORAL | 2 refills | Status: DC | PRN
Start: 2024-03-10 — End: 2024-04-30

## 2024-03-10 MED ORDER — PRAZOSIN HCL 1 MG PO CAPS: 1.0000 mg | ORAL_CAPSULE | Freq: Every day | ORAL | 3 refills | Status: DC

## 2024-03-10 MED ORDER — CITALOPRAM HYDROBROMIDE 20 MG PO TABS: 20.0000 mg | ORAL_TABLET | Freq: Every day | ORAL | 3 refills | Status: DC

## 2024-03-10 MED ORDER — HYDROXYZINE HCL 25 MG PO TABS: 25.0000 mg | ORAL_TABLET | Freq: Four times a day (QID) | ORAL | 2 refills | Status: DC | PRN

## 2024-03-10 NOTE — Progress Notes (Signed)
 BH MD/PA/NP OP Progress Note Virtual Visit via Video Note  I connected with Etrulia Zarr on 03/10/24 at 11:00 AM EDT by a video enabled telemedicine application and verified that I am speaking with the correct person using two identifiers.  Location: Patient: Home Provider: Clinic   I discussed the limitations of evaluation and management by telemedicine and the availability of in person appointments. The patient expressed understanding and agreed to proceed.  I provided 30 minutes of non-face-to-face time during this encounter.    03/10/2024 11:05 AM Jasha Hodzic  MRN:  [id number]  Chief Complaint: "Things are really good"  HPI:  38 year old female seen today for follow-up psychiatric evaluation.  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  She is currently managed on hydroxyzine 25 mg 3 times daily, Trazodone 50 mg nightly as needed, prazosin 1 mg nightly, and Celexa 20  mg daily.  She notes that her medications are effective in managing her psychiatric conditions.    Today provider utilized a Spanish interpreter as patient is Spanish-speaking.  During exam she was pleasant, cooperative, engaged in conversation, and maintained eye contact. She informed Clinical research associate that things are going really good. She notes that since starting Trazone she has been sleeping 7-8 hours. Patient notes that her mood, anxiety, and depression are well managed. Today provider conducted a GAD 7 and she scored a 15, at her last visit she scored a 15. She reports that she continues to worry about her son who has autism. Provider also conducted PHQ-9 and patient scored a 14, at her last visit she scored a 13. Today she denies SI/HI/VAH, mania, or paranoia. She endorses adequate appetite.  Patient requested that her referral form to [facility] be re faxed. She notes that when she called [facility] she was informed that they had not received the referral. Provider was agreeable.  Patient informed writer  that at times she has ringing in her ear. Provider informed patient that at times antidepressants can cause ringing in the ear. Provider also suggested patient see her PCP to address the ringing. At this time she notes that she does not want adjustments in her medications. No medication changes made today. Patient agreeable to continue medications as prescribed. No other concerns noted at this time.     Visit Diagnosis:  No diagnosis found.      Past Psychiatric History: Anxiety, depression , PTSD  Past Medical History:  Past Medical History:  Diagnosis Date   GERD (gastroesophageal reflux disease)    History of depression    HSV-2 infection    Idiopathic urticaria 07/26/2021   Preeclampsia 04/20/2012    Past Surgical History:  Procedure Laterality Date   No past surgery      Family Psychiatric History: Denies  Family History:  Family History  Problem Relation Age of Onset   Kidney disease Maternal Uncle    Cervical cancer Maternal Grandmother    Cancer Other    Hypertension Other    Diabetes Other    Heart disease Neg Hx     Social History:  Social History   Socioeconomic History   Marital status: Single    Spouse name: Not on file   Number of children: 3   Years of education: Not on file   Highest education level: Not on file  Occupational History   Not on file  Tobacco Use   Smoking status: Never   Smokeless tobacco: Never  Vaping Use   Vaping status: Never Used  Substance and Sexual Activity   Alcohol use: No   Drug use: No   Sexual activity: Not Currently    Birth control/protection: None  Other Topics Concern   Not on file  Social History Narrative   ** Merged History Encounter **       Social Drivers of Health   Financial Resource Strain: Low Risk  (09/17/2022)   Overall Financial Resource Strain (CARDIA)    Difficulty of Paying Living Expenses: Not hard at all  Food Insecurity: No Food Insecurity (01/27/2024)   Hunger Vital Sign    Worried  About Running Out of Food in the Last Year: Never true    Ran Out of Food in the Last Year: Never true  Transportation Needs: No Transportation Needs (09/17/2022)   PRAPARE - Administrator, Civil Service (Medical): No    Lack of Transportation (Non-Medical): No  Physical Activity: Inactive (09/17/2022)   Exercise Vital Sign    Days of Exercise per Week: 0 days    Minutes of Exercise per Session: 0 min  Stress: Stress Concern Present (09/17/2022)   [employer] of Occupational Health - Occupational Stress Questionnaire    Feeling of Stress : Very much  Social Connections: Socially Isolated (09/17/2022)   Social Connection and Isolation Panel [NHANES]    Frequency of Communication with Friends and Family: Never    Frequency of Social Gatherings with Friends and Family: Never    Attends Religious Services: Never    Database administrator or Organizations: No    Attends Banker Meetings: Never    Marital Status: Never married    Allergies:  Allergies  Allergen Reactions   Gold Sodium Thiosulfate    Nickel    Other     Seafood, Lidocaine, Gold, Red meat,lentils,eggs, pecans    Metabolic Disorder Labs: Lab Results  Component Value Date   HGBA1C 6.0 (H) 05/12/2023   No results found for: "PROLACTIN" Lab Results  Component Value Date   CHOL 202 (H) 05/12/2023   TRIG 91 05/12/2023   HDL 44 05/12/2023   CHOLHDL 4.6 (H) 05/12/2023   LDLCALC 141 (H) 05/12/2023   Lab Results  Component Value Date   TSH 3.680 05/12/2023   TSH 2.658 01/20/2019    Therapeutic Level Labs: No results found for: "LITHIUM" No results found for: "VALPROATE" No results found for: "CBMZ"  Current Medications: Current Outpatient Medications  Medication Sig Dispense Refill   acetaminophen (TYLENOL) 325 MG tablet Take 2 tablets (650 mg total) by mouth every 4 (four) hours as needed (for pain scale < 4). 30 tablet 0   citalopram (CELEXA) 20 MG tablet Take 1 tablet (20 mg  total) by mouth daily. 90 tablet 3   cyclobenzaprine (FLEXERIL) 5 MG tablet Take 1 tablet (5 mg total) by mouth 3 (three) times daily as needed. 10 tablet 0   EPINEPHrine (EPIPEN 2-PAK) 0.3 mg/0.3 mL IJ SOAJ injection Inject 0.3 mg into the muscle once as needed for up to 1 dose (for severe allergic reaction). CAll 911 immediately if you have to use this medicine 1 each 1   hydrOXYzine (ATARAX) 25 MG tablet TAKE 1 TABLET (25 MG TOTAL) BY MOUTH 4 (FOUR) TIMES DAILY AS NEEDED. 360 tablet 2   ibuprofen (ADVIL) 800 MG tablet Take 1 tablet (800 mg total) by mouth 3 (three) times daily. 21 tablet 0   omeprazole (PRILOSEC) 20 MG capsule Take 1 capsule (20 mg total) by mouth daily before breakfast. 90  capsule 1   prazosin (MINIPRESS) 1 MG capsule Take 1 capsule (1 mg total) by mouth at bedtime. 30 capsule 3   traZODone (DESYREL) 50 MG tablet TAKE 1 TABLET BY MOUTH AT BEDTIME AS NEEDED FOR SLEEP. 90 tablet 2   valACYclovir (VALTREX) 500 MG tablet Take 500 mg by mouth daily.     No current facility-administered medications for this visit.     Musculoskeletal: Strength & Muscle Tone: within normal limits and telehealth visit Gait & Station: normal, telehealth visit Patient leans: N/A  Psychiatric Specialty Exam: Review of Systems  not currently breastfeeding.There is no height or weight on file to calculate BMI.  General Appearance: Well Groomed  Eye Contact:  Good   Speech:  Clear and Coherent and Normal Rate  Volume:  Normal  Mood:  Anxious and Depressed  Affect:  Appropriate and Congruent  Thought Process:  Coherent, Goal Directed, and Linear  Orientation:  Full (Time, Place, and Person)  Thought Content: WDL and Logical   Suicidal Thoughts:  No  Homicidal Thoughts:  No  Memory:  Immediate;   Good Recent;   Good Remote;   Good  Judgement:  Good  Insight:  Good  Psychomotor Activity:  Normal  Concentration:  Concentration: Good and Attention Span: Good  Recall:  Good  Fund of  Knowledge: Good  Language: Good  Akathisia:  No  Handed:  Right  AIMS (if indicated): not done  Assets:  Communication Skills Desire for Improvement Financial Resources/Insurance Housing Intimacy Physical Health Social Support  ADL's:  Intact  Cognition: WNL  Sleep:  Fair   Screenings: AIMS    Flowsheet Row [facility]  AIMS Total Score 0      AUDIT    Flowsheet Row [facility]  Alcohol Use Disorder Identification Test Final Score (AUDIT) 0      GAD-7    Flowsheet Row Video Visit from 01/06/2024 in [facility] Video Visit from 10/15/2023 in [facility] Video Visit from 07/15/2023 in [facility] Video Visit from 05/07/2023 in [facility] Video Visit from 02/19/2023 in [facility]  Total GAD-7 Score 15 12 21 10 19       PHQ2-9    Flowsheet Row Video Visit from 01/06/2024 in [facility] Video Visit from 10/15/2023 in [facility] Video Visit from 07/15/2023 in [facility] Video Visit from 05/07/2023 in [facility] Video Visit from 02/19/2023 in [facility]  PHQ-2 Total Score 4 2 6 3 5   PHQ-9 Total Score 13 14 24 15 15       Flowsheet Row ED from 08/08/2023 in [facility] Video Visit from 07/15/2023 in [facility] ED from 05/27/2023 in [facility] Emergency Department at [facility]  C-SSRS RISK CATEGORY No Risk Low Risk No Risk        Assessment and Plan: Patient informed writer that she has situational stressors but requested that her medication not be adjusted. Patient  informed writer that at times she has ringing in her ear. Provider informed patient that at times antidepressants can cause ringing in the ear. Provider also suggested patient see her PCP to address the ringing. At this time she notes that she  does not want adjustments in her medications. No medication changes made today. Patient agreeable to continue medications as prescribed.   1. Generalized anxiety disorder  Start- traZODone (DESYREL) 50 MG tablet; Take 1 tablet (50 mg total) by mouth at bedtime as needed for sleep.  Dispense: 30 tablet; Refill: 3 Continue- citalopram (CELEXA) 20 MG tablet; Take 1 tablet (20 mg total) by mouth daily.  Dispense: 90 tablet; Refill: 3 Continue- hydrOXYzine (ATARAX) 25 MG tablet; Take 1 tablet (25 mg total) by mouth 4 (four) times daily as needed.  Dispense: 120 tablet; Refill: 3  2. Post-partum depression  Continue- traZODone (DESYREL) 50 MG tablet; Take 1 tablet (50 mg total) by mouth at bedtime as needed for sleep.  Dispense: 30 tablet; Refill: 3 Continue- citalopram (CELEXA) 20 MG tablet; Take 1 tablet (20 mg total) by mouth daily.  Dispense: 90 tablet; Refill: 3 Continue- prazosin (MINIPRESS) 1 MG capsule; Take 1 capsule (1 mg total) by mouth at bedtime.  Dispense: 30 capsule; Refill: 3     Follow-up in 2.5 months   Shanna Cisco, NP 03/10/2024, 11:05 AM

## 2024-04-26 ENCOUNTER — Encounter: Payer: Self-pay | Admitting: Nurse Practitioner

## 2024-04-26 ENCOUNTER — Ambulatory Visit: Payer: Medicaid Other | Attending: Nurse Practitioner | Admitting: Nurse Practitioner

## 2024-04-26 VITALS — BP 115/75 | HR 72 | Resp 19 | Ht 62.0 in | Wt 179.0 lb

## 2024-04-26 DIAGNOSIS — K219 Gastro-esophageal reflux disease without esophagitis: Secondary | ICD-10-CM | POA: Diagnosis not present

## 2024-04-26 NOTE — Progress Notes (Signed)
 Assessment & Plan:  Kristen Frank was seen today for medical management of chronic issues.  Diagnoses and all orders for this visit:  GERD without esophagitis INSTRUCTIONS: Avoid GERD Triggers: acidic, spicy or fried foods, caffeine, coffee, sodas,  alcohol and chocolate.   Continue omeprazole  as prescribed   Patient has been counseled on age-appropriate routine health concerns for screening and prevention. These are reviewed and up-to-date. Referrals have been placed accordingly. Immunizations are up-to-date or declined.    Subjective:   Chief Complaint  Patient presents with   Medical Management of Chronic Issues    Kristen Frank 38 y.o. female presents to office today for follow up to GERD  She is currently taking omeprazole  20 mg daily as prescribed. Reports effective relief with taking. She is now established with another PCP at [facility] and will be continuing her care there at this time.   Review of Systems  Constitutional:  Negative for fever, malaise/fatigue and weight loss.  HENT: Negative.  Negative for nosebleeds.   Eyes: Negative.  Negative for blurred vision, double vision and photophobia.  Respiratory: Negative.  Negative for cough and shortness of breath.   Cardiovascular: Negative.  Negative for chest pain, palpitations and leg swelling.  Gastrointestinal: Negative.  Negative for heartburn, nausea and vomiting.  Musculoskeletal: Negative.  Negative for myalgias.  Neurological: Negative.  Negative for dizziness, focal weakness, seizures and headaches.  Psychiatric/Behavioral: Negative.  Negative for suicidal ideas.     Past Medical History:  Diagnosis Date   GERD (gastroesophageal reflux disease)    History of depression    HSV-2 infection    Idiopathic urticaria 07/26/2021   Preeclampsia 04/20/2012    Past Surgical History:  Procedure Laterality Date   No past surgery      Family History  Problem Relation Age of Onset   Kidney disease Maternal Uncle     Cervical cancer Maternal Grandmother    Cancer Other    Hypertension Other    Diabetes Other    Heart disease Neg Hx     Social History Reviewed with no changes to be made today.   Outpatient Medications Prior to Visit  Medication Sig Dispense Refill   acetaminophen  (TYLENOL ) 325 MG tablet Take 2 tablets (650 mg total) by mouth every 4 (four) hours as needed (for pain scale < 4). 30 tablet 0   citalopram  (CELEXA ) 20 MG tablet Take 1 tablet (20 mg total) by mouth daily. 90 tablet 3   EPINEPHrine  (EPIPEN  2-PAK) 0.3 mg/0.3 mL IJ SOAJ injection Inject 0.3 mg into the muscle once as needed for up to 1 dose (for severe allergic reaction). CAll 911 immediately if you have to use this medicine 1 each 1   hydrOXYzine  (ATARAX ) 25 MG tablet Take 1 tablet (25 mg total) by mouth 4 (four) times daily as needed. 360 tablet 2   ibuprofen  (ADVIL ) 800 MG tablet Take 1 tablet (800 mg total) by mouth 3 (three) times daily. 21 tablet 0   omeprazole  (PRILOSEC) 20 MG capsule Take 1 capsule (20 mg total) by mouth daily before breakfast. 90 capsule 1   prazosin  (MINIPRESS ) 1 MG capsule Take 1 capsule (1 mg total) by mouth at bedtime. 30 capsule 3   traZODone  (DESYREL ) 50 MG tablet Take 1 tablet (50 mg total) by mouth at bedtime as needed. for sleep 90 tablet 2   valACYclovir  (VALTREX ) 500 MG tablet Take 500 mg by mouth daily.     cyclobenzaprine  (FLEXERIL ) 5 MG tablet Take 1 tablet (  5 mg total) by mouth 3 (three) times daily as needed. (Patient not taking: Reported on 04/26/2024) 10 tablet 0   No facility-administered medications prior to visit.    Allergies  Allergen Reactions   Gold Sodium Thiosulfate    Nickel    Other     Seafood, Lidocaine , Gold, Red meat,lentils,eggs, pecans       Objective:    BP 115/75 (BP Location: Left Arm, Patient Position: Sitting, Cuff Size: Normal)   Pulse 72   Resp 19   Ht 5\' 2"  (1.575 m)   Wt 179 lb (81.2 kg)   SpO2 95%   BMI 32.74 kg/m  Wt Readings from Last 3  Encounters:  04/26/24 179 lb (81.2 kg)  01/27/24 176 lb 9.6 oz (80.1 kg)  08/08/23 178 lb (80.7 kg)    Physical Exam Vitals and nursing note reviewed.  Constitutional:      Appearance: She is well-developed.  HENT:     Head: Normocephalic and atraumatic.  Cardiovascular:     Rate and Rhythm: Normal rate and regular rhythm.     Heart sounds: Normal heart sounds. No murmur heard.    No friction rub. No gallop.  Pulmonary:     Effort: Pulmonary effort is normal. No tachypnea or respiratory distress.     Breath sounds: Normal breath sounds. No decreased breath sounds, wheezing, rhonchi or rales.  Chest:     Chest wall: No tenderness.  Abdominal:     General: Bowel sounds are normal.     Palpations: Abdomen is soft.  Musculoskeletal:        General: Normal range of motion.     Cervical back: Normal range of motion.  Skin:    General: Skin is warm and dry.  Neurological:     Mental Status: She is alert and oriented to person, place, and time.     Coordination: Coordination normal.  Psychiatric:        Behavior: Behavior normal. Behavior is cooperative.        Thought Content: Thought content normal.        Judgment: Judgment normal.          Patient has been counseled extensively about nutrition and exercise as well as the importance of adherence with medications and regular follow-up. The patient was given clear instructions to go to ER or return to medical center if symptoms don't improve, worsen or new problems develop. The patient verbalized understanding.   Follow-up: Return if symptoms worsen or fail to improve.   Collins Dean, FNP-BC [facility] [place], [place] [phone]   04/26/2024, 1:03 PM

## 2024-04-30 ENCOUNTER — Telehealth (HOSPITAL_COMMUNITY): Admitting: Psychiatry

## 2024-04-30 ENCOUNTER — Encounter (HOSPITAL_COMMUNITY): Payer: Self-pay | Admitting: Psychiatry

## 2024-04-30 DIAGNOSIS — F53 Postpartum depression: Secondary | ICD-10-CM

## 2024-04-30 DIAGNOSIS — F411 Generalized anxiety disorder: Secondary | ICD-10-CM

## 2024-04-30 MED ORDER — CITALOPRAM HYDROBROMIDE 20 MG PO TABS
20.0000 mg | ORAL_TABLET | Freq: Every day | ORAL | 3 refills | Status: DC
Start: 2024-04-30 — End: 2024-09-15

## 2024-04-30 MED ORDER — TRAZODONE HCL 50 MG PO TABS
50.0000 mg | ORAL_TABLET | Freq: Every evening | ORAL | 2 refills | Status: DC | PRN
Start: 2024-04-30 — End: 2024-09-15

## 2024-04-30 MED ORDER — HYDROXYZINE HCL 25 MG PO TABS
25.0000 mg | ORAL_TABLET | Freq: Four times a day (QID) | ORAL | 2 refills | Status: DC | PRN
Start: 2024-04-30 — End: 2024-09-15

## 2024-04-30 MED ORDER — PRAZOSIN HCL 1 MG PO CAPS: 1.0000 mg | ORAL_CAPSULE | Freq: Every day | ORAL | 3 refills | Status: DC

## 2024-04-30 NOTE — Progress Notes (Signed)
 BH MD/PA/NP OP Progress Note Virtual Visit via Video Note  I connected with Kristen Frank on 04/30/24 at  8:00 AM EDT by a video enabled telemedicine application and verified that I am speaking with the correct person using two identifiers.  Location: Patient: Home Provider: Clinic   I discussed the limitations of evaluation and management by telemedicine and the availability of in person appointments. The patient expressed understanding and agreed to proceed.  I provided 30 minutes of non-face-to-face time during this encounter.    04/30/2024 8:15 AM Kristen Frank  MRN:  [id number]  Chief Complaint: "I feel a little better"  HPI:  38 year old female seen today for follow-up psychiatric evaluation.  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  She is currently managed on hydroxyzine  25 mg 3 times daily, Trazodone  50 mg nightly as needed, prazosin  1 mg nightly, and Celexa  20  mg daily.  She notes that her medications are effective in managing her psychiatric conditions.    Today provider utilized a Spanish interpreter as patient is Spanish-speaking.  During exam she was pleasant, cooperative, engaged in conversation, and maintained eye contact. She informed Clinical research associate that she feels better. She notes that she went to her PCP to address her ringing in her ear and notes that it has subsided. She also notes that her son who has autism will be starting therapy soon which she is happy about. Patient notes that her sleep, anxiety, and depression continues to be well managed.  Today provider conducted a GAD 7 and she scored a 6, at her last visit she scored a 15. Provider also conducted PHQ-9 and patient scored a 9, at her last visit she scored a 14. Today she denies SI/HI/VAH, mania, or paranoia. She endorses adequate appetite and notes that she has gained 3 pounds since her last visit.  Overall she notes that she is doing well. No medication changes made today. Patient  agreeable to continue medications as prescribed. No other concerns noted at this time.     Visit Diagnosis:    ICD-10-CM   1. Post-partum depression  F53.0 prazosin  (MINIPRESS ) 1 MG capsule    citalopram  (CELEXA ) 20 MG tablet    traZODone  (DESYREL ) 50 MG tablet    2. Generalized anxiety disorder  F41.1 citalopram  (CELEXA ) 20 MG tablet    hydrOXYzine  (ATARAX ) 25 MG tablet    traZODone  (DESYREL ) 50 MG tablet          Past Psychiatric History: Anxiety, depression , PTSD  Past Medical History:  Past Medical History:  Diagnosis Date   GERD (gastroesophageal reflux disease)    History of depression    HSV-2 infection    Idiopathic urticaria 07/26/2021   Preeclampsia 04/20/2012    Past Surgical History:  Procedure Laterality Date   No past surgery      Family Psychiatric History: Denies  Family History:  Family History  Problem Relation Age of Onset   Kidney disease Maternal Uncle    Cervical cancer Maternal Grandmother    Cancer Other    Hypertension Other    Diabetes Other    Heart disease Neg Hx     Social History:  Social History   Socioeconomic History   Marital status: Single    Spouse name: Not on file   Number of children: 3   Years of education: Not on file   Highest education level: Not on file  Occupational History   Not on file  Tobacco Use  Smoking status: Never   Smokeless tobacco: Never  Vaping Use   Vaping status: Never Used  Substance and Sexual Activity   Alcohol use: No   Drug use: No   Sexual activity: Not Currently    Birth control/protection: None  Other Topics Concern   Not on file  Social History Narrative   ** Merged History Encounter **       Social Drivers of Health   Financial Resource Strain: Low Risk  (09/17/2022)   Overall Financial Resource Strain (CARDIA)    Difficulty of Paying Living Expenses: Not hard at all  Food Insecurity: No Food Insecurity (01/27/2024)   Hunger Vital Sign    Worried About Running Out of  Food in the Last Year: Never true    Ran Out of Food in the Last Year: Never true  Transportation Needs: No Transportation Needs (09/17/2022)   PRAPARE - Administrator, Civil Service (Medical): No    Lack of Transportation (Non-Medical): No  Physical Activity: Inactive (09/17/2022)   Exercise Vital Sign    Days of Exercise per Week: 0 days    Minutes of Exercise per Session: 0 min  Stress: Stress Concern Present (09/17/2022)   [employer] of Occupational Health - Occupational Stress Questionnaire    Feeling of Stress : Very much  Social Connections: Socially Isolated (09/17/2022)   Social Connection and Isolation Panel [NHANES]    Frequency of Communication with Friends and Family: Never    Frequency of Social Gatherings with Friends and Family: Never    Attends Religious Services: Never    Database administrator or Organizations: No    Attends Banker Meetings: Never    Marital Status: Never married    Allergies:  Allergies  Allergen Reactions   Gold Sodium Thiosulfate    Nickel    Other     Seafood, Lidocaine , Gold, Red meat,lentils,eggs, pecans    Metabolic Disorder Labs: Lab Results  Component Value Date   HGBA1C 6.0 (H) 05/12/2023   No results found for: "PROLACTIN" Lab Results  Component Value Date   CHOL 202 (H) 05/12/2023   TRIG 91 05/12/2023   HDL 44 05/12/2023   CHOLHDL 4.6 (H) 05/12/2023   LDLCALC 141 (H) 05/12/2023   Lab Results  Component Value Date   TSH 3.680 05/12/2023   TSH 2.658 01/20/2019    Therapeutic Level Labs: No results found for: "LITHIUM" No results found for: "VALPROATE" No results found for: "CBMZ"  Current Medications: Current Outpatient Medications  Medication Sig Dispense Refill   acetaminophen  (TYLENOL ) 325 MG tablet Take 2 tablets (650 mg total) by mouth every 4 (four) hours as needed (for pain scale < 4). 30 tablet 0   citalopram  (CELEXA ) 20 MG tablet Take 1 tablet (20 mg total) by mouth daily.  90 tablet 3   cyclobenzaprine  (FLEXERIL ) 5 MG tablet Take 1 tablet (5 mg total) by mouth 3 (three) times daily as needed. (Patient not taking: Reported on 04/26/2024) 10 tablet 0   EPINEPHrine  (EPIPEN  2-PAK) 0.3 mg/0.3 mL IJ SOAJ injection Inject 0.3 mg into the muscle once as needed for up to 1 dose (for severe allergic reaction). CAll 911 immediately if you have to use this medicine 1 each 1   hydrOXYzine  (ATARAX ) 25 MG tablet Take 1 tablet (25 mg total) by mouth 4 (four) times daily as needed. 360 tablet 2   ibuprofen  (ADVIL ) 800 MG tablet Take 1 tablet (800 mg total) by mouth 3 (three)  times daily. 21 tablet 0   omeprazole  (PRILOSEC) 20 MG capsule Take 1 capsule (20 mg total) by mouth daily before breakfast. 90 capsule 1   prazosin  (MINIPRESS ) 1 MG capsule Take 1 capsule (1 mg total) by mouth at bedtime. 30 capsule 3   traZODone  (DESYREL ) 50 MG tablet Take 1 tablet (50 mg total) by mouth at bedtime as needed. for sleep 90 tablet 2   valACYclovir  (VALTREX ) 500 MG tablet Take 500 mg by mouth daily.     No current facility-administered medications for this visit.     Musculoskeletal: Strength & Muscle Tone: within normal limits and telehealth visit Gait & Station: normal, telehealth visit Patient leans: N/A  Psychiatric Specialty Exam: Review of Systems  not currently breastfeeding.There is no height or weight on file to calculate BMI.  General Appearance: Well Groomed  Eye Contact:  Good   Speech:  Clear and Coherent and Normal Rate  Volume:  Normal  Mood:  Euthymic  Affect:  Appropriate and Congruent  Thought Process:  Coherent, Goal Directed, and Linear  Orientation:  Full (Time, Place, and Person)  Thought Content: WDL and Logical   Suicidal Thoughts:  No  Homicidal Thoughts:  No  Memory:  Immediate;   Good Recent;   Good Remote;   Good  Judgement:  Good  Insight:  Good  Psychomotor Activity:  Normal  Concentration:  Concentration: Good and Attention Span: Good  Recall:   Good  Fund of Knowledge: Good  Language: Good  Akathisia:  No  Handed:  Right  AIMS (if indicated): not done  Assets:  Communication Skills Desire for Improvement Financial Resources/Insurance Housing Intimacy Physical Health Social Support  ADL's:  Intact  Cognition: WNL  Sleep:  Good   Screenings: AIMS    Flowsheet Row [facility]  AIMS Total Score 0      AUDIT    Flowsheet Row [facility]  Alcohol Use Disorder Identification Test Final Score (AUDIT) 0      GAD-7    Flowsheet Row Video Visit from 04/30/2024 in [facility] Video Visit from 03/10/2024 in [facility] Video Visit from 01/06/2024 in [facility] Video Visit from 10/15/2023 in [facility] Video Visit from 07/15/2023 in [facility]  Total GAD-7 Score 6 15 15 12 21       PHQ2-9    Flowsheet Row Video Visit from 04/30/2024 in [facility] Video Visit from 03/10/2024 in [facility] Video Visit from 01/06/2024 in [facility] Video Visit from 10/15/2023 in [facility] Video Visit from 07/15/2023 in [facility]  PHQ-2 Total Score 3 3 4 2 6   PHQ-9 Total Score 9 14 13 14 24       Flowsheet Row ED from 08/08/2023 in [facility] at [facility] Video Visit from 07/15/2023 in [facility] ED from 05/27/2023 in [facility] Emergency Department at [facility]  C-SSRS RISK CATEGORY No Risk Low Risk No Risk        Assessment and Plan: Patient that her anxiety and depression are well managed. No medication changes made today. Patient  agreeable to continue medications as prescribed.   1. Generalized anxiety disorder  Start- traZODone  (DESYREL ) 50 MG tablet;  Take 1 tablet (50 mg total) by mouth at bedtime as needed for sleep.  Dispense: 30 tablet; Refill: 3 Continue- citalopram  (CELEXA ) 20 MG tablet; Take 1 tablet (20 mg total) by mouth daily.  Dispense: 90 tablet; Refill: 3 Continue- hydrOXYzine  (ATARAX ) 25 MG tablet; Take 1 tablet (25 mg total) by mouth 4 (four) times daily as needed.  Dispense: 120 tablet; Refill: 3  2. Post-partum depression  Continue- traZODone  (DESYREL ) 50 MG tablet; Take 1 tablet (50 mg total) by mouth at bedtime as needed for sleep.  Dispense: 30 tablet; Refill: 3 Continue- citalopram  (CELEXA ) 20 MG tablet; Take 1 tablet (20 mg total) by mouth daily.  Dispense: 90 tablet; Refill: 3 Continue- prazosin  (MINIPRESS ) 1 MG capsule; Take 1 capsule (1 mg total) by mouth at bedtime.  Dispense: 30 capsule; Refill: 3     Follow-up in 1.5 months   Arlyne Bering, NP 04/30/2024, 8:15 AM

## 2024-06-17 ENCOUNTER — Telehealth (HOSPITAL_COMMUNITY): Admitting: Psychiatry

## 2024-07-07 ENCOUNTER — Other Ambulatory Visit (HOSPITAL_COMMUNITY): Payer: Self-pay | Admitting: Psychiatry

## 2024-07-07 DIAGNOSIS — F53 Postpartum depression: Secondary | ICD-10-CM

## 2024-09-15 ENCOUNTER — Encounter (HOSPITAL_COMMUNITY): Payer: Self-pay | Admitting: Psychiatry

## 2024-09-15 ENCOUNTER — Telehealth (INDEPENDENT_AMBULATORY_CARE_PROVIDER_SITE_OTHER): Admitting: Psychiatry

## 2024-09-15 ENCOUNTER — Telehealth (HOSPITAL_COMMUNITY): Admitting: Psychiatry

## 2024-09-15 DIAGNOSIS — F411 Generalized anxiety disorder: Secondary | ICD-10-CM

## 2024-09-15 DIAGNOSIS — F3341 Major depressive disorder, recurrent, in partial remission: Secondary | ICD-10-CM

## 2024-09-15 MED ORDER — CITALOPRAM HYDROBROMIDE 20 MG PO TABS: 20.0000 mg | ORAL_TABLET | Freq: Every day | ORAL | 1 refills | Status: DC

## 2024-09-15 NOTE — Progress Notes (Signed)
 BH MD/PA/NP OP Progress Note Virtual Visit via Video Note  I connected with Kristen Frank on 09/15/24 at  2:00 PM EDT by a video enabled telemedicine application and verified that I am speaking with the correct person using two identifiers.  Location: Patient: Home Provider: Clinic   I discussed the limitations of evaluation and management by telemedicine and the availability of in person appointments. The patient expressed understanding and agreed to proceed.  I provided 30 minutes of non-face-to-face time during this encounter.    09/15/2024 12:03 PM Kristen Frank  MRN:  [id number]  Chief Complaint: I am only taking Celexa   HPI:  38 year old female seen today for follow-up psychiatric evaluation.  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  She is currently managed on hydroxyzine  25 mg 3 times daily, Trazodone  50 mg nightly as needed, prazosin  1 mg nightly, and Celexa  20  mg daily.  She notes that she has only been taking Celexa  and reports that it is effective in managing her psychiatric conditions.   Today provider utilized a Spanish interpreter as patient is Spanish-speaking.  During exam she was pleasant, cooperative, engaged in conversation, and maintained eye contact. She informed Clinical research associate that she has only been taking Celexa .  She notes that mentally she is in a good place.  She denies having concerns today and reports that her anxiety and depression are well-managed.  Today provider conducted a GAD-7 and patient scored a 3.  Provider also conducted PHQ-9 and patient  scored a 4. Today she denies SI/HI/VAH, mania, or paranoia. She endorses adequate appetite and sleep.  Overall she notes that she is doing well. No medication changes made today. Patient agreeable to continue medications as prescribed. No other concerns noted at this time.     Visit Diagnosis:    ICD-10-CM   1. Recurrent major depressive disorder, in partial remission (HCC)  F33.41  citalopram  (CELEXA ) 20 MG tablet    2. Generalized anxiety disorder  F41.1 citalopram  (CELEXA ) 20 MG tablet           Past Psychiatric History: Anxiety, depression , PTSD  Past Medical History:  Past Medical History:  Diagnosis Date   GERD (gastroesophageal reflux disease)    History of depression    HSV-2 infection    Idiopathic urticaria 07/26/2021   Preeclampsia 04/20/2012    Past Surgical History:  Procedure Laterality Date   No past surgery      Family Psychiatric History: Denies  Family History:  Family History  Problem Relation Age of Onset   Kidney disease Maternal Uncle    Cervical cancer Maternal Grandmother    Cancer Other    Hypertension Other    Diabetes Other    Heart disease Neg Hx     Social History:  Social History   Socioeconomic History   Marital status: Single    Spouse name: Not on file   Number of children: 3   Years of education: Not on file   Highest education level: Not on file  Occupational History   Not on file  Tobacco Use   Smoking status: Never   Smokeless tobacco: Never  Vaping Use   Vaping status: Never Used  Substance and Sexual Activity   Alcohol use: No   Drug use: No   Sexual activity: Not Currently    Birth control/protection: None  Other Topics Concern   Not on file  Social History Narrative   ** Merged History Encounter **  Social Drivers of Corporate investment banker Strain: Low Risk  (09/17/2022)   Overall Financial Resource Strain (CARDIA)    Difficulty of Paying Living Expenses: Not hard at all  Food Insecurity: No Food Insecurity (01/27/2024)   Hunger Vital Sign    Worried About Running Out of Food in the Last Year: Never true    Ran Out of Food in the Last Year: Never true  Transportation Needs: No Transportation Needs (09/17/2022)   PRAPARE - Administrator, Civil Service (Medical): No    Lack of Transportation (Non-Medical): No  Physical Activity: Inactive (09/17/2022)   Exercise  Vital Sign    Days of Exercise per Week: 0 days    Minutes of Exercise per Session: 0 min  Stress: Stress Concern Present (09/17/2022)   [employer] of Occupational Health - Occupational Stress Questionnaire    Feeling of Stress : Very much  Social Connections: Socially Isolated (09/17/2022)   Social Connection and Isolation Panel    Frequency of Communication with Friends and Family: Never    Frequency of Social Gatherings with Friends and Family: Never    Attends Religious Services: Never    Database administrator or Organizations: No    Attends Banker Meetings: Never    Marital Status: Never married    Allergies:  Allergies  Allergen Reactions   Gold Sodium Thiosulfate    Nickel    Other     Seafood, Lidocaine , Gold, Red meat,lentils,eggs, pecans    Metabolic Disorder Labs: Lab Results  Component Value Date   HGBA1C 6.0 (H) 05/12/2023   No results found for: PROLACTIN Lab Results  Component Value Date   CHOL 202 (H) 05/12/2023   TRIG 91 05/12/2023   HDL 44 05/12/2023   CHOLHDL 4.6 (H) 05/12/2023   LDLCALC 141 (H) 05/12/2023   Lab Results  Component Value Date   TSH 3.680 05/12/2023   TSH 2.658 01/20/2019    Therapeutic Level Labs: No results found for: LITHIUM No results found for: VALPROATE No results found for: CBMZ  Current Medications: Current Outpatient Medications  Medication Sig Dispense Refill   acetaminophen  (TYLENOL ) 325 MG tablet Take 2 tablets (650 mg total) by mouth every 4 (four) hours as needed (for pain scale < 4). 30 tablet 0   citalopram  (CELEXA ) 20 MG tablet Take 1 tablet (20 mg total) by mouth daily. 90 tablet 1   cyclobenzaprine  (FLEXERIL ) 5 MG tablet Take 1 tablet (5 mg total) by mouth 3 (three) times daily as needed. (Patient not taking: Reported on 04/26/2024) 10 tablet 0   EPINEPHrine  (EPIPEN  2-PAK) 0.3 mg/0.3 mL IJ SOAJ injection Inject 0.3 mg into the muscle once as needed for up to 1 dose (for severe  allergic reaction). CAll 911 immediately if you have to use this medicine 1 each 1   ibuprofen  (ADVIL ) 800 MG tablet Take 1 tablet (800 mg total) by mouth 3 (three) times daily. 21 tablet 0   omeprazole  (PRILOSEC) 20 MG capsule Take 1 capsule (20 mg total) by mouth daily before breakfast. 90 capsule 1   valACYclovir  (VALTREX ) 500 MG tablet Take 500 mg by mouth daily.     No current facility-administered medications for this visit.     Musculoskeletal: Strength & Muscle Tone: within normal limits and telehealth visit Gait & Station: normal, telehealth visit Patient leans: N/A  Psychiatric Specialty Exam: Review of Systems  There were no vitals taken for this visit.There is no height or weight  on file to calculate BMI.  General Appearance: Well Groomed  Eye Contact:  Good   Speech:  Clear and Coherent and Normal Rate  Volume:  Normal  Mood:  Euthymic  Affect:  Appropriate and Congruent  Thought Process:  Coherent, Goal Directed, and Linear  Orientation:  Full (Time, Place, and Person)  Thought Content: WDL and Logical   Suicidal Thoughts:  No  Homicidal Thoughts:  No  Memory:  Immediate;   Good Recent;   Good Remote;   Good  Judgement:  Good  Insight:  Good  Psychomotor Activity:  Normal  Concentration:  Concentration: Good and Attention Span: Good  Recall:  Good  Fund of Knowledge: Good  Language: Good  Akathisia:  No  Handed:  Right  AIMS (if indicated): not done  Assets:  Communication Skills Desire for Improvement Financial Resources/Insurance Housing Intimacy Physical Health Social Support  ADL's:  Intact  Cognition: WNL  Sleep:  Good   Screenings: AIMS    Flowsheet Row [facility]  AIMS Total Score 0   AUDIT    Flowsheet Row Ad[facility]  Alcohol Use Disorder Identification Test Final Score (AUDIT) 0    GAD-7    Flowsheet Row Video Visit from 09/15/2024 in [facility] Video Visit from 04/30/2024 in [facility] Video Visit from 03/10/2024 in [facility] Video Visit from 01/06/2024 in [facility] Video Visit from 10/15/2023 in [facility]  Total GAD-7 Score 3 6 15 15 12    PHQ2-9    Flowsheet Row Video Visit from 09/15/2024 in [facility] Video Visit from 04/30/2024 in [facility] Video Visit from 03/10/2024 in [facility] Video Visit from 01/06/2024 in [facility] Video Visit from 10/15/2023 in [facility]  PHQ-2 Total Score 2 3 3 4 2   PHQ-9 Total Score 4 9 14 13 14    Flowsheet Row ED from 08/08/2023 in [facility] Emergency Department at [facility] Video Visit from 07/15/2023 in [facility] ED from 05/27/2023 in [facility] Emergency Department at [facility]  C-SSRS RISK CATEGORY No Risk Low Risk No Risk     Assessment and Plan: Patient that her anxiety and depression are well managed.  She reports that she no longer takes trazodone , hydroxyzine , and prazosin .  At this time these medications discontinued and patient will continue Celexa  as prescribed.  1. Generalized anxiety disorder  Continue- citalopram  (CELEXA ) 20 MG tablet; Take 1 tablet (20 mg total) by mouth daily.  Dispense: 90 tablet; Refill: 1  2. Recurrent major depressive disorder, in partial remission (HCC) (Primary)  Continue- citalopram  (CELEXA ) 20 MG tablet; Take 1 tablet (20 mg total) by mouth daily.  Dispense: 90 tablet; Refill: 1   Follow-up in 3 months   Zane FORBES Bach, NP 09/15/2024, 12:03 PM

## 2024-12-15 ENCOUNTER — Telehealth (INDEPENDENT_AMBULATORY_CARE_PROVIDER_SITE_OTHER): Admitting: Psychiatry

## 2024-12-15 ENCOUNTER — Encounter (HOSPITAL_COMMUNITY): Payer: Self-pay | Admitting: Psychiatry

## 2024-12-15 DIAGNOSIS — F3341 Major depressive disorder, recurrent, in partial remission: Secondary | ICD-10-CM | POA: Diagnosis not present

## 2024-12-15 DIAGNOSIS — F411 Generalized anxiety disorder: Secondary | ICD-10-CM

## 2024-12-15 MED ORDER — CITALOPRAM HYDROBROMIDE 20 MG PO TABS: 20.0000 mg | ORAL_TABLET | Freq: Every day | ORAL | 1 refills | Status: AC

## 2024-12-15 NOTE — Progress Notes (Signed)
 BH MD/PA/NP OP Progress Note Virtual Visit via Video Note  I connected with Kristen Frank on 12/15/2024 at  3:00 PM EST by a video enabled telemedicine application and verified that I am speaking with the correct person using two identifiers.  Location: Patient: Home Provider: Clinic   I discussed the limitations of evaluation and management by telemedicine and the availability of in person appointments. The patient expressed understanding and agreed to proceed.  I provided 45 minutes of non-face-to-face time during this encounter.    12/15/2024 12:25 PM Kristen Frank  MRN:  [id number]  Chief Complaint: I am doing okay  HPI:  38 year old female seen today for follow-up psychiatric evaluation.  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  She is currently managed on Celexa  20  mg daily.  She notes that she Celexa  has been effective in managing her psychiatric conditions.   Today provider utilized a Spanish interpreter as patient is Spanish-speaking.  During exam she was pleasant, cooperative, engaged in conversation, and maintained eye contact.  She notes that overall she is doing okay.  She informed clinical research associate that her mood is stable and notes that she had minimal anxiety or depression.  Today provider conducted a GAD-7 the patient scored a 6, at her last visit she scored a 3.  Provider also conducted PHQ-9 and patient and patient scored a 5, at her last visit she scored a 4. Today she denies SI/HI/VAH, mania, or paranoia. She endorses adequate appetite and sleep.  Patient informed clinical research associate that she did not get a call from [facility] to assess her for autism.  Provider informed patient that another referral will be placed.  Provider also instructed patient to call [facility] for herself.  She endorsed understanding and agreed.  Overall she notes that she is doing well. No medication changes made today. Patient agreeable to continue medications as prescribed. No other concerns  noted at this time.     Visit Diagnosis:    ICD-10-CM   1. Generalized anxiety disorder  F41.1 citalopram  (CELEXA ) 20 MG tablet    2. Recurrent major depressive disorder, in partial remission  F33.41 citalopram  (CELEXA ) 20 MG tablet           Past Psychiatric History: Anxiety, depression , PTSD  Past Medical History:  Past Medical History:  Diagnosis Date   GERD (gastroesophageal reflux disease)    History of depression    HSV-2 infection    Idiopathic urticaria 07/26/2021   Preeclampsia 04/20/2012    Past Surgical History:  Procedure Laterality Date   No past surgery      Family Psychiatric History: Denies  Family History:  Family History  Problem Relation Age of Onset   Kidney disease Maternal Uncle    Cervical cancer Maternal Grandmother    Cancer Other    Hypertension Other    Diabetes Other    Heart disease Neg Hx     Social History:  Social History   Socioeconomic History   Marital status: Single    Spouse name: Not on file   Number of children: 3   Years of education: Not on file   Highest education level: Not on file  Occupational History   Not on file  Tobacco Use   Smoking status: Never   Smokeless tobacco: Never  Vaping Use   Vaping status: Never Used  Substance and Sexual Activity   Alcohol use: No   Drug use: No   Sexual activity: Not Currently  Birth control/protection: None  Other Topics Concern   Not on file  Social History Narrative   ** Merged History Encounter **       Social Drivers of Health   Tobacco Use: Low Risk (12/15/2024)   Patient History    Smoking Tobacco Use: Never    Smokeless Tobacco Use: Never    Passive Exposure: Not on file  Financial Resource Strain: Low Risk (09/17/2022)   Overall Financial Resource Strain (CARDIA)    Difficulty of Paying Living Expenses: Not hard at all  Food Insecurity: No Food Insecurity (01/27/2024)   Hunger Vital Sign    Worried About Running Out of Food in the Last Year:  Never true    Ran Out of Food in the Last Year: Never true  Transportation Needs: No Transportation Needs (09/17/2022)   PRAPARE - Administrator, Civil Service (Medical): No    Lack of Transportation (Non-Medical): No  Physical Activity: Inactive (09/17/2022)   Exercise Vital Sign    Days of Exercise per Week: 0 days    Minutes of Exercise per Session: 0 min  Stress: Stress Concern Present (09/17/2022)   [employer] of Occupational Health - Occupational Stress Questionnaire    Feeling of Stress : Very much  Social Connections: Socially Isolated (09/17/2022)   Social Connection and Isolation Panel    Frequency of Communication with Friends and Family: Never    Frequency of Social Gatherings with Friends and Family: Never    Attends Religious Services: Never    Database Administrator or Organizations: No    Attends Banker Meetings: Never    Marital Status: Never married  Depression (PHQ2-9): Medium Risk (12/15/2024)   Depression (PHQ2-9)    PHQ-2 Score: 5  Alcohol Screen: Not on file  Housing: Medium Risk (09/17/2022)   Housing    Last Housing Risk Score: 1  Utilities: Not At Risk (09/17/2022)   AHC Utilities    Threatened with loss of utilities: No  Health Literacy: Adequate Health Literacy (01/27/2024)   B1300 Health Literacy    Frequency of need for help with medical instructions: Never    Allergies:  Allergies  Allergen Reactions   Gold Sodium Thiosulfate    Nickel    Other     Seafood, Lidocaine , Gold, Red meat,lentils,eggs, pecans    Metabolic Disorder Labs: Lab Results  Component Value Date   HGBA1C 6.0 (H) 05/12/2023   No results found for: PROLACTIN Lab Results  Component Value Date   CHOL 202 (H) 05/12/2023   TRIG 91 05/12/2023   HDL 44 05/12/2023   CHOLHDL 4.6 (H) 05/12/2023   LDLCALC 141 (H) 05/12/2023   Lab Results  Component Value Date   TSH 3.680 05/12/2023   TSH 2.658 01/20/2019    Therapeutic Level Labs: No  results found for: LITHIUM No results found for: VALPROATE No results found for: CBMZ  Current Medications: Current Outpatient Medications  Medication Sig Dispense Refill   acetaminophen  (TYLENOL ) 325 MG tablet Take 2 tablets (650 mg total) by mouth every 4 (four) hours as needed (for pain scale < 4). 30 tablet 0   citalopram  (CELEXA ) 20 MG tablet Take 1 tablet (20 mg total) by mouth daily. 90 tablet 1   cyclobenzaprine  (FLEXERIL ) 5 MG tablet Take 1 tablet (5 mg total) by mouth 3 (three) times daily as needed. (Patient not taking: Reported on 04/26/2024) 10 tablet 0   EPINEPHrine  (EPIPEN  2-PAK) 0.3 mg/0.3 mL IJ SOAJ injection Inject 0.3  mg into the muscle once as needed for up to 1 dose (for severe allergic reaction). CAll 911 immediately if you have to use this medicine 1 each 1   ibuprofen  (ADVIL ) 800 MG tablet Take 1 tablet (800 mg total) by mouth 3 (three) times daily. 21 tablet 0   omeprazole  (PRILOSEC) 20 MG capsule Take 1 capsule (20 mg total) by mouth daily before breakfast. 90 capsule 1   valACYclovir  (VALTREX ) 500 MG tablet Take 500 mg by mouth daily.     No current facility-administered medications for this visit.     Musculoskeletal: Strength & Muscle Tone: within normal limits and telehealth visit Gait & Station: normal, telehealth visit Patient leans: N/A  Psychiatric Specialty Exam: Review of Systems  There were no vitals taken for this visit.There is no height or weight on file to calculate BMI.  General Appearance: Well Groomed  Eye Contact:  Good   Speech:  Clear and Coherent and Normal Rate  Volume:  Normal  Mood:  Euthymic  Affect:  Appropriate and Congruent  Thought Process:  Coherent, Goal Directed, and Linear  Orientation:  Full (Time, Place, and Person)  Thought Content: WDL and Logical   Suicidal Thoughts:  No  Homicidal Thoughts:  No  Memory:  Immediate;   Good Recent;   Good Remote;   Good  Judgement:  Good  Insight:  Good  Psychomotor  Activity:  Normal  Concentration:  Concentration: Good and Attention Span: Good  Recall:  Good  Fund of Knowledge: Good  Language: Good  Akathisia:  No  Handed:  Right  AIMS (if indicated): not done  Assets:  Communication Skills Desire for Improvement Financial Resources/Insurance Housing Intimacy Physical Health Social Support  ADL's:  Intact  Cognition: WNL  Sleep:  Good   Screenings: AIMS    Flowsheet Row [facility]  AIMS Total Score 0   AUDIT    Flowsheet Row [facility]  Alcohol Use Disorder Identification Test Final Score (AUDIT) 0   GAD-7    Flowsheet Row Video Visit from 12/15/2024 in [facility] Video Visit from 09/15/2024 in [facility] Video Visit from 04/30/2024 in [facility] Video Visit from 03/10/2024 in [facility] Video Visit from 01/06/2024 in [facility]  Total GAD-7 Score 6 3 6 15 15    PHQ2-9    Flowsheet Row Video Visit from 12/15/2024 in [facility] Video Visit from 09/15/2024 in [facility] Video Visit from 04/30/2024 in [facility] Video Visit from 03/10/2024 in [facility] Video Visit from 01/06/2024 in [facility]  PHQ-2 Total Score 2 2 3 3 4   PHQ-9 Total Score 5 4 9 14 13    Flowsheet Row ED from 08/08/2023 in [facility] at [facility] Video Visit from 07/15/2023 in [facility] ED from 05/27/2023 in [facility] Emergency Department at [facility]  C-SSRS RISK CATEGORY No Risk Low Risk No Risk     Assessment and Plan: Patient that her anxiety and depression are  well managed.  Patient notes that she has not gotten a call from [facility] to assess her for autism.  Provider informed patient that another referral could be placed.  No medication changes made today.  Patient agreeable to continue medication as prescribed. 1. Generalized anxiety disorder  Continue- citalopram  (CELEXA ) 20 MG tablet; Take 1 tablet (20 mg total) by mouth daily.  Dispense: 90 tablet; Refill: 1  2. Recurrent major depressive disorder, in partial remission (HCC) (Primary)  Continue- citalopram  (CELEXA ) 20 MG tablet; Take 1 tablet (20 mg total) by mouth daily.  Dispense: 90 tablet; Refill: 1   Follow-up in 3 months   Zane FORBES Bach, NP 12/15/2024, 12:25 PM

## 2025-02-11 ENCOUNTER — Telehealth (HOSPITAL_COMMUNITY): Admitting: Psychiatry
